# Patient Record
Sex: Male | Born: 1980 | Race: White | Hispanic: No | Smoking: Current some day smoker
Health system: Southern US, Community
[De-identification: ages and names within clinical notes are randomized; demographics above are authoritative.]

## PROBLEM LIST (undated history)

## (undated) ENCOUNTER — Emergency Department: Discharge status: Absent without leave | Payer: Medicaid Other

## (undated) DIAGNOSIS — Z9049 Acquired absence of other specified parts of digestive tract: Secondary | ICD-10-CM

## (undated) DIAGNOSIS — L03115 Cellulitis of right lower limb: Secondary | ICD-10-CM

## (undated) DIAGNOSIS — L02415 Cutaneous abscess of right lower limb: Secondary | ICD-10-CM

## (undated) DIAGNOSIS — G8918 Other acute postprocedural pain: Secondary | ICD-10-CM

## (undated) DIAGNOSIS — I4891 Unspecified atrial fibrillation: Secondary | ICD-10-CM

## (undated) HISTORY — DX: Unspecified atrial fibrillation: I48.91

## (undated) HISTORY — DX: Acquired absence of other specified parts of digestive tract: Z90.49

## (undated) HISTORY — DX: Cellulitis of right lower limb: L03.115

## (undated) HISTORY — DX: Other acute postprocedural pain: G89.18

## (undated) HISTORY — PX: APPENDECTOMY: SHX54

## (undated) HISTORY — DX: Cutaneous abscess of right lower limb: L02.415

---

## 2005-09-12 ENCOUNTER — Emergency Department: Payer: Self-pay | Admitting: Emergency Medicine

## 2005-09-16 ENCOUNTER — Emergency Department: Payer: Self-pay | Admitting: Internal Medicine

## 2005-09-17 ENCOUNTER — Ambulatory Visit: Payer: Self-pay | Admitting: Internal Medicine

## 2005-12-19 ENCOUNTER — Emergency Department: Payer: Self-pay | Admitting: Emergency Medicine

## 2005-12-20 ENCOUNTER — Ambulatory Visit: Payer: Self-pay | Admitting: Orthopaedic Surgery

## 2006-07-12 ENCOUNTER — Emergency Department: Payer: Self-pay | Admitting: Emergency Medicine

## 2006-07-12 ENCOUNTER — Other Ambulatory Visit: Payer: Self-pay

## 2007-01-01 ENCOUNTER — Emergency Department: Payer: Self-pay | Admitting: Emergency Medicine

## 2007-10-05 ENCOUNTER — Emergency Department: Payer: Self-pay | Admitting: Unknown Physician Specialty

## 2009-05-21 ENCOUNTER — Observation Stay: Payer: Self-pay | Admitting: Internal Medicine

## 2009-11-07 ENCOUNTER — Emergency Department: Payer: Self-pay | Admitting: Emergency Medicine

## 2010-05-16 ENCOUNTER — Emergency Department: Payer: Self-pay | Admitting: Family Medicine

## 2010-05-19 ENCOUNTER — Inpatient Hospital Stay: Payer: Self-pay | Admitting: Internal Medicine

## 2010-05-31 ENCOUNTER — Emergency Department: Payer: Self-pay | Admitting: Emergency Medicine

## 2010-08-08 ENCOUNTER — Emergency Department: Payer: Self-pay | Admitting: Emergency Medicine

## 2010-09-03 ENCOUNTER — Emergency Department (HOSPITAL_COMMUNITY)
Admission: EM | Admit: 2010-09-03 | Discharge: 2010-09-03 | Payer: Self-pay | Source: Home / Self Care | Admitting: Emergency Medicine

## 2010-09-04 ENCOUNTER — Inpatient Hospital Stay: Payer: Self-pay | Admitting: Unknown Physician Specialty

## 2010-10-25 ENCOUNTER — Emergency Department: Payer: Self-pay | Admitting: Emergency Medicine

## 2010-11-21 ENCOUNTER — Emergency Department: Payer: Self-pay

## 2010-11-22 ENCOUNTER — Emergency Department: Payer: Self-pay | Admitting: Emergency Medicine

## 2010-11-25 ENCOUNTER — Emergency Department: Payer: Self-pay | Admitting: Emergency Medicine

## 2010-11-29 ENCOUNTER — Emergency Department: Payer: Self-pay | Admitting: Emergency Medicine

## 2010-12-05 LAB — POCT I-STAT, CHEM 8
BUN: 9 mg/dL (ref 6–23)
Calcium, Ion: 1.12 mmol/L (ref 1.12–1.32)
Chloride: 104 mEq/L (ref 96–112)
Creatinine, Ser: 1.1 mg/dL (ref 0.4–1.5)
Glucose, Bld: 82 mg/dL (ref 70–99)
Potassium: 4.4 mEq/L (ref 3.5–5.1)
Sodium: 139 mEq/L (ref 135–145)

## 2010-12-05 LAB — ETHANOL: Alcohol, Ethyl (B): <5 mg/dL (ref 0–10)

## 2010-12-05 LAB — URINALYSIS, ROUTINE W REFLEX MICROSCOPIC
Bilirubin Urine: NEGATIVE
Glucose, UA: NEGATIVE mg/dL
Specific Gravity, Urine: 1.017 (ref 1.005–1.030)

## 2010-12-05 LAB — RAPID URINE DRUG SCREEN, HOSP PERFORMED
Amphetamines: NOT DETECTED
Opiates: NOT DETECTED

## 2011-06-19 ENCOUNTER — Inpatient Hospital Stay: Payer: Self-pay | Admitting: Internal Medicine

## 2011-06-20 DIAGNOSIS — R079 Chest pain, unspecified: Secondary | ICD-10-CM

## 2011-06-25 ENCOUNTER — Encounter: Payer: Self-pay | Admitting: Internal Medicine

## 2011-10-23 ENCOUNTER — Emergency Department: Payer: Self-pay | Admitting: Emergency Medicine

## 2012-01-23 ENCOUNTER — Emergency Department: Payer: Self-pay | Admitting: Emergency Medicine

## 2012-02-02 ENCOUNTER — Emergency Department: Payer: Self-pay | Admitting: Emergency Medicine

## 2012-02-07 ENCOUNTER — Emergency Department: Payer: Self-pay | Admitting: *Deleted

## 2012-05-12 ENCOUNTER — Emergency Department: Payer: Self-pay | Admitting: Emergency Medicine

## 2012-05-24 ENCOUNTER — Emergency Department: Payer: Self-pay | Admitting: Emergency Medicine

## 2012-05-24 LAB — BASIC METABOLIC PANEL WITH GFR
Anion Gap: 6 — ABNORMAL LOW (ref 7–16)
BUN: 16 mg/dL (ref 7–18)
Calcium, Total: 9.3 mg/dL (ref 8.5–10.1)
Chloride: 103 mmol/L (ref 98–107)
Co2: 30 mmol/L (ref 21–32)
Creatinine: 1.18 mg/dL (ref 0.60–1.30)
EGFR (African American): >60
EGFR (Non-African Amer.): >60
Glucose: 104 mg/dL — ABNORMAL HIGH (ref 65–99)
Osmolality: 279 (ref 275–301)
Potassium: 3.8 mmol/L (ref 3.5–5.1)
Sodium: 139 mmol/L (ref 136–145)

## 2012-05-24 LAB — HEPATIC FUNCTION PANEL A (ARMC)
Albumin: 4 g/dL (ref 3.4–5.0)
Alkaline Phosphatase: 109 U/L (ref 50–136)
Bilirubin, Direct: 0.1 mg/dL (ref 0.00–0.20)
Bilirubin,Total: 0.4 mg/dL (ref 0.2–1.0)
SGOT(AST): 18 U/L (ref 15–37)
SGPT (ALT): 18 U/L (ref 12–78)
Total Protein: 7.4 g/dL (ref 6.4–8.2)

## 2012-05-24 LAB — CBC
HGB: 15.6 g/dL (ref 13.0–18.0)
MCH: 32.5 pg (ref 26.0–34.0)
RBC: 4.82 10*6/uL (ref 4.40–5.90)

## 2012-05-24 LAB — URINALYSIS, COMPLETE
Bilirubin,UR: NEGATIVE
Leukocyte Esterase: NEGATIVE
RBC,UR: <1 /HPF (ref 0–5)
Squamous Epithelial: NONE SEEN

## 2012-07-03 ENCOUNTER — Emergency Department: Payer: Self-pay | Admitting: Internal Medicine

## 2012-09-01 ENCOUNTER — Emergency Department: Payer: Self-pay | Admitting: Emergency Medicine

## 2012-09-01 LAB — CBC
Platelet: 239 10*3/uL (ref 150–440)
RBC: 4.87 10*6/uL (ref 4.40–5.90)
WBC: 7.1 10*3/uL (ref 3.8–10.6)

## 2012-09-01 LAB — COMPREHENSIVE METABOLIC PANEL
Albumin: 4 g/dL (ref 3.4–5.0)
Anion Gap: 7 (ref 7–16)
Calcium, Total: 8.7 mg/dL (ref 8.5–10.1)
Glucose: 91 mg/dL (ref 65–99)
Potassium: 4.1 mmol/L (ref 3.5–5.1)
SGOT(AST): 33 U/L (ref 15–37)

## 2013-02-03 ENCOUNTER — Emergency Department: Payer: Self-pay | Admitting: Emergency Medicine

## 2013-02-03 LAB — COMPREHENSIVE METABOLIC PANEL
Alkaline Phosphatase: 97 U/L (ref 50–136)
Anion Gap: 9 (ref 7–16)
BUN: 10 mg/dL (ref 7–18)
Bilirubin,Total: 0.3 mg/dL (ref 0.2–1.0)
Calcium, Total: 9 mg/dL (ref 8.5–10.1)
Creatinine: 1.17 mg/dL (ref 0.60–1.30)
EGFR (African American): >60
Glucose: 136 mg/dL — ABNORMAL HIGH (ref 65–99)
Potassium: 3.3 mmol/L — ABNORMAL LOW (ref 3.5–5.1)
SGPT (ALT): 33 U/L (ref 12–78)

## 2013-02-03 LAB — CBC
HGB: 14.9 g/dL (ref 13.0–18.0)
MCHC: 34 g/dL (ref 32.0–36.0)
Platelet: 252 10*3/uL (ref 150–440)
RBC: 4.79 10*6/uL (ref 4.40–5.90)
RDW: 12.5 % (ref 11.5–14.5)

## 2013-02-04 LAB — URINALYSIS, COMPLETE
Bilirubin,UR: NEGATIVE
Ph: 6 (ref 4.5–8.0)
Specific Gravity: 1.018 (ref 1.003–1.030)

## 2014-01-04 ENCOUNTER — Emergency Department: Payer: Self-pay | Admitting: Emergency Medicine

## 2014-01-04 LAB — CBC
HCT: 44.5 % (ref 40.0–52.0)
HGB: 14.5 g/dL (ref 13.0–18.0)
MCH: 30.4 pg (ref 26.0–34.0)
MCHC: 32.6 g/dL (ref 32.0–36.0)
MCV: 93 fL (ref 80–100)
PLATELETS: 221 10*3/uL (ref 150–440)
RBC: 4.77 10*6/uL (ref 4.40–5.90)
RDW: 12.6 % (ref 11.5–14.5)
WBC: 7.6 10*3/uL (ref 3.8–10.6)

## 2014-01-04 LAB — TSH: Thyroid Stimulating Horm: 1.28 u[IU]/mL

## 2014-08-24 ENCOUNTER — Emergency Department: Payer: Self-pay | Admitting: Student

## 2015-11-20 ENCOUNTER — Emergency Department: Payer: BLUE CROSS/BLUE SHIELD

## 2015-11-20 ENCOUNTER — Encounter: Payer: Self-pay | Admitting: *Deleted

## 2015-11-20 ENCOUNTER — Emergency Department
Admission: EM | Admit: 2015-11-20 | Discharge: 2015-11-20 | Disposition: A | Discharge status: Home / Self Care | Payer: BLUE CROSS/BLUE SHIELD | Attending: Emergency Medicine | Admitting: Emergency Medicine

## 2015-11-20 DIAGNOSIS — R1030 Lower abdominal pain, unspecified: Secondary | ICD-10-CM

## 2015-11-20 DIAGNOSIS — R1031 Right lower quadrant pain: Secondary | ICD-10-CM | POA: Diagnosis not present

## 2015-11-20 DIAGNOSIS — M7022 Olecranon bursitis, left elbow: Secondary | ICD-10-CM

## 2015-11-20 DIAGNOSIS — R1032 Left lower quadrant pain: Secondary | ICD-10-CM | POA: Diagnosis not present

## 2015-11-20 DIAGNOSIS — Z880 Allergy status to penicillin: Secondary | ICD-10-CM | POA: Insufficient documentation

## 2015-11-20 DIAGNOSIS — J069 Acute upper respiratory infection, unspecified: Secondary | ICD-10-CM | POA: Diagnosis not present

## 2015-11-20 DIAGNOSIS — Y9389 Activity, other specified: Secondary | ICD-10-CM | POA: Diagnosis not present

## 2015-11-20 DIAGNOSIS — R112 Nausea with vomiting, unspecified: Secondary | ICD-10-CM

## 2015-11-20 DIAGNOSIS — R05 Cough: Secondary | ICD-10-CM | POA: Diagnosis present

## 2015-11-20 DIAGNOSIS — Z792 Long term (current) use of antibiotics: Secondary | ICD-10-CM | POA: Insufficient documentation

## 2015-11-20 DIAGNOSIS — K920 Hematemesis: Secondary | ICD-10-CM | POA: Diagnosis present

## 2015-11-20 LAB — CBC
HCT: 45.5 % (ref 40.0–52.0)
Hemoglobin: 15.4 g/dL (ref 13.0–18.0)
MCH: 31.2 pg (ref 26.0–34.0)
MCHC: 33.9 g/dL (ref 32.0–36.0)
MCV: 91.8 fL (ref 80.0–100.0)
PLATELETS: 164 10*3/uL (ref 150–440)
RBC: 4.95 MIL/uL (ref 4.40–5.90)
RDW: 12.5 % (ref 11.5–14.5)
WBC: 4.1 10*3/uL (ref 3.8–10.6)

## 2015-11-20 LAB — COMPREHENSIVE METABOLIC PANEL
ALT: 23 U/L (ref 17–63)
AST: 31 U/L (ref 15–41)
Albumin: 4.1 g/dL (ref 3.5–5.0)
Alkaline Phosphatase: 63 U/L (ref 38–126)
Anion gap: 11 (ref 5–15)
BILIRUBIN TOTAL: 0.4 mg/dL (ref 0.3–1.2)
BUN: 12 mg/dL (ref 6–20)
CALCIUM: 8.6 mg/dL — AB (ref 8.9–10.3)
CHLORIDE: 102 mmol/L (ref 101–111)
CO2: 25 mmol/L (ref 22–32)
CREATININE: 1.12 mg/dL (ref 0.61–1.24)
Glucose, Bld: 110 mg/dL — ABNORMAL HIGH (ref 65–99)
Potassium: 3.8 mmol/L (ref 3.5–5.1)
Sodium: 138 mmol/L (ref 135–145)
TOTAL PROTEIN: 7.3 g/dL (ref 6.5–8.1)

## 2015-11-20 LAB — LIPASE, BLOOD: LIPASE: 37 U/L (ref 11–51)

## 2015-11-20 LAB — RAPID INFLUENZA A&B ANTIGENS
Influenza A (ARMC): NOT DETECTED
Influenza B (ARMC): NOT DETECTED

## 2015-11-20 MED ORDER — GUAIFENESIN-CODEINE 100-10 MG/5ML PO SOLN: 10.0000 mL | ORAL | Status: DC | PRN

## 2015-11-20 MED ORDER — ONDANSETRON HCL 4 MG/2ML IJ SOLN
4.0000 mg | Freq: Once | INTRAMUSCULAR | Status: AC
  Administered 2015-11-20: 4 mg via INTRAVENOUS
  Filled 2015-11-20: qty 2

## 2015-11-20 MED ORDER — PROMETHAZINE HCL 25 MG/ML IJ SOLN
25.0000 mg | Freq: Once | INTRAMUSCULAR | Status: AC
  Administered 2015-11-20: 25 mg via INTRAVENOUS

## 2015-11-20 MED ORDER — PROMETHAZINE HCL 25 MG/ML IJ SOLN
INTRAMUSCULAR | Status: AC
  Administered 2015-11-20: 25 mg via INTRAVENOUS
  Filled 2015-11-20: qty 1

## 2015-11-20 MED ORDER — PROMETHAZINE HCL 25 MG PO TABS: 25.0000 mg | ORAL_TABLET | Freq: Four times a day (QID) | ORAL | Status: DC | PRN

## 2015-11-20 MED ORDER — SODIUM CHLORIDE 0.9 % IV BOLUS (SEPSIS)
1000.0000 mL | Freq: Once | INTRAVENOUS | Status: AC
Start: 2015-11-20 — End: 2015-11-20
  Administered 2015-11-20: 1000 mL via INTRAVENOUS

## 2015-11-20 MED ORDER — HYDROCODONE-ACETAMINOPHEN 5-325 MG PO TABS: 1.0000 | ORAL_TABLET | ORAL | Status: DC | PRN

## 2015-11-20 MED ORDER — IOHEXOL 240 MG/ML SOLN
25.0000 mL | Freq: Once | INTRAMUSCULAR | Status: AC | PRN
  Administered 2015-11-20: 25 mL via ORAL

## 2015-11-20 MED ORDER — IBUPROFEN 800 MG PO TABS: 800.0000 mg | ORAL_TABLET | Freq: Three times a day (TID) | ORAL | Status: DC | PRN

## 2015-11-20 MED ORDER — CHLORPHENIRAMINE MALEATE 4 MG PO TABS: 4.0000 mg | ORAL_TABLET | Freq: Two times a day (BID) | ORAL | Status: DC | PRN

## 2015-11-20 MED ORDER — MORPHINE SULFATE (PF) 4 MG/ML IV SOLN
4.0000 mg | Freq: Once | INTRAVENOUS | Status: AC
  Administered 2015-11-20: 4 mg via INTRAVENOUS
  Filled 2015-11-20: qty 1

## 2015-11-20 MED ORDER — LEVOFLOXACIN 500 MG PO TABS: 500.0000 mg | ORAL_TABLET | Freq: Every day | ORAL | Status: DC

## 2015-11-20 MED ORDER — IOHEXOL 300 MG/ML  SOLN
100.0000 mL | Freq: Once | INTRAMUSCULAR | Status: AC | PRN
  Administered 2015-11-20: 100 mL via INTRAVENOUS

## 2015-11-20 NOTE — ED Provider Notes (Signed)
[facility] Emergency Department Provider Note  ____________________________________________  Time seen: Approximately 7:20 AM  I have reviewed the triage vital signs and the nursing notes.   HISTORY  Chief Complaint Influenza    HPI Alex Doo. is a 35 y.o. male who presents for evaluation of fever chills body aches cough no blood 4 days. Patient states that he was seen in his primary care clinic and started on Z-Pak. Patient continues to feel bad with body aches and fever. No relief with current antibiotics or over-the-counter medications. Additionally complains about having a swollen painful elbow for 1 month denies any known trauma. Denies any past medical history of gout. Pain in his elbow and his body aches are about an 8/10.   No past medical history on file.  There are no active problems to display for this patient.   No past surgical history on file.  Current Outpatient Rx  Name  Route  Sig  Dispense  Refill  . chlorpheniramine (CHLOR-TRIMETON) 4 MG tablet   Oral   Take 1 tablet (4 mg total) by mouth 2 (two) times daily as needed for allergies or rhinitis.   30 tablet   0   . guaiFENesin-codeine 100-10 MG/5ML syrup   Oral   Take 10 mLs by mouth every 4 (four) hours as needed for cough.   180 mL   0   . ibuprofen (ADVIL,MOTRIN) 800 MG tablet   Oral   Take 1 tablet (800 mg total) by mouth every 8 (eight) hours as needed.   30 tablet   0   . levofloxacin (LEVAQUIN) 500 MG tablet   Oral   Take 1 tablet (500 mg total) by mouth daily.   10 tablet   0     Allergies Penicillins  No family history on file.  Social History Social History  Substance Use Topics  . Smoking status: Not on file  . Smokeless tobacco: Not on file  . Alcohol Use: Not on file    Review of Systems Constitutional: Positive fever/chills Eyes: No visual changes. ENT: Positive sore throat. Cardiovascular: Denies chest pain. Respiratory: Denies  shortness of breath. Positive for cough with productive bloody sputum. Scant. Gastrointestinal: No abdominal pain.  No nausea, no vomiting.  No diarrhea.  No constipation. Genitourinary: Negative for dysuria. Musculoskeletal: Positive for generalized body aches. Skin: Negative for rash. Neurological: Negative for headaches, focal weakness or numbness.  10-point ROS otherwise negative.  ____________________________________________   PHYSICAL EXAM:  VITAL SIGNS: ED Triage Vitals  Enc Vitals Group     BP 11/20/15 0706 124/70 mmHg     Pulse Rate 11/20/15 0706 96     Resp 11/20/15 0706 18     Temp 11/20/15 0706 99.8 F (37.7 C)     Temp Source 11/20/15 0706 Oral     SpO2 11/20/15 0706 97 %     Weight 11/20/15 0706 200 lb (90.719 kg)     Height 11/20/15 0706  (1.854 m)     Head Cir --      Peak Flow --      Pain Score 11/20/15 0710 8     Pain Loc --      Pain Edu? --      Excl. in GC? --     Constitutional: Alert and oriented. Ill appearing and in no acute distress. Head: Atraumatic. Nose: Positive congestion/rhinnorhea. Mouth/Throat: Mucous membranes are moist.  Oropharynx non-erythematous. Neck: No stridor. Full range of motion nontender  Cardiovascular: Normal rate, regular rhythm. Grossly normal heart sounds.  Good peripheral circulation. Respiratory: Normal respiratory effort.  No retractions. Lungs CTAB. Decreased breath sounds noted on the right. Musculoskeletal: No lower extremity tenderness nor edema. Left elbow with soft boggy fluid-filled sac consistent with an olecranon bursae. Neurologic:  Normal speech and language. No gross focal neurologic deficits are appreciated. No gait instability. Skin:  Skin is warm, dry and intact. No rash noted. Psychiatric: Mood and affect are normal. Speech and behavior are normal.  ____________________________________________   LABS (all labs ordered are listed, but only abnormal results are displayed)  Labs Reviewed   RAPID INFLUENZA A&B ANTIGENS ([facility] ONLY)   ____________________________________________   RADIOLOGY  No acute cardiopulmonary disease noted. ____________________________________________   PROCEDURES  Procedure(s) performed: None  Critical Care performed: No  ____________________________________________   INITIAL IMPRESSION / ASSESSMENT AND PLAN / ED COURSE  Pertinent labs & imaging results that were available during my care of the patient were reviewed by me and considered in my medical decision making (see chart for details).  Olecranon bursitis and acute upper respiratory infection. Rx given for Levaquin 500 mg daily 10 days, Robitussin-AC, Motrin 800 mg 3 times a day, and chlorpheniramine for drainage. Patient to follow up with his PCP or medical referral to Dr. Rosita Kea for orthopedic evaluation of his elbow. Work excuse 2 days given patient voices no other emergency medical complaints at this visit. ____________________________________________   FINAL CLINICAL IMPRESSION(S) / ED DIAGNOSES  Final diagnoses:  Olecranon bursitis, left  URI, acute     This chart was dictated using voice recognition software/Dragon. Despite best efforts to proofread, errors can occur which can change the meaning. Any change was purely unintentional.   Evangeline Dakin, PA-C 11/20/15 7829  Jene Every, MD 11/20/15 1452

## 2015-11-20 NOTE — ED Notes (Signed)
Pt reports he has been on a z-pak since Thursday and reports to have been coughing up blood as well.  C/o chills and sweats as well.  Pt ambulatory to room without difficulty and is wearing a mask.

## 2015-11-20 NOTE — Discharge Instructions (Signed)
Bursitis Bursitis is inflammation and irritation of a bursa, which is one of the small, fluid-filled sacs that cushion and protect the moving parts of your body. These sacs are located between bones and muscles, muscle attachments, or skin areas next to bones. A bursa protects these structures from the wear and tear that results from frequent movement. An inflamed bursa causes pain and swelling. Fluid may build up inside the sac. Bursitis is most common near joints, especially the knees, elbows, hips, and shoulders. CAUSES Bursitis can be caused by:   Injury from:  A direct blow, like falling on your knee or elbow.  Overuse of a joint (repetitive stress).  Infection. This can happen if bacteria gets into a bursa through a cut or scrape near a joint.  Diseases that cause joint inflammation, such as gout and rheumatoid arthritis. RISK FACTORS You may be at risk for bursitis if you:   Have a job or hobby that involves a lot of repetitive stress on your joints.  Have a condition that weakens your body's defense system (immune system), such as diabetes, cancer, or HIV.  Lift and reach overhead often.  Kneel or lean on hard surfaces often.  Run or walk often. SIGNS AND SYMPTOMS The most common signs and symptoms of bursitis are:  Pain that gets worse when you move the affected body part or put weight on it.  Inflammation.  Stiffness. Other signs and symptoms may include:  Redness.  Tenderness.  Warmth.  Pain that continues after rest.  Fever and chills. This may occur in bursitis caused by infection. DIAGNOSIS Bursitis may be diagnosed by:   Medical history and physical exam.  MRI.  A procedure to drain fluid from the bursa with a needle (aspiration). The fluid may be checked for signs of infection or gout.  Blood tests to rule out other causes of inflammation. TREATMENT  Bursitis can usually be treated at home with rest, ice, compression, and elevation (RICE). For  mild bursitis, RICE treatment may be all you need. Other treatments may include:  Nonsteroidal anti-inflammatory drugs (NSAIDs) to treat pain and inflammation.  Corticosteroids to fight inflammation. You may have these drugs injected into and around the area of bursitis.  Aspiration of bursitis fluid to relieve pain and improve movement.  Antibiotic medicine to treat an infected bursa.  A splint, brace, or walking aid.  Physical therapy if you continue to have pain or limited movement.  Surgery to remove a damaged or infected bursa. This may be needed if you have a very bad case of bursitis or if other treatments have not worked. HOME CARE INSTRUCTIONS   Take medicines only as directed by your health care provider.  If you were prescribed an antibiotic medicine, finish it all even if you start to feel better.  Rest the affected area as directed by your health care provider.  Keep the area elevated.  Avoid activities that make pain worse.  Apply ice to the injured area:  Place ice in a plastic bag.  Place a towel between your skin and the bag.  Leave the ice on for 20 minutes, 2-3 times a day.  Use splints, braces, pads, or walking aids as directed by your health care provider.  Keep all follow-up visits as directed by your health care provider. This is important. PREVENTION   Wear knee pads if you kneel often.  Wear sturdy running or walking shoes that fit you well.  Take regular breaks from repetitive activity.  Warm  up by stretching before doing any strenuous activity.  Maintain a healthy weight or lose weight as recommended by your health care provider. Ask your health care provider if you need help.  Exercise regularly. Start any new physical activity gradually. SEEK MEDICAL CARE IF:   Your bursitis is not responding to treatment or home care.  You have a fever.  You have chills.   This information is not intended to replace advice given to you by your  health care provider. Make sure you discuss any questions you have with your health care provider.   Document Released: 09/07/2000 Document Revised: 06/01/2015 Document Reviewed: 11/30/2013 Elsevier Interactive Patient Education 2016 [employer]  Upper Respiratory Infection, Adult Most upper respiratory infections (URIs) are a viral infection of the air passages leading to the lungs. A URI affects the nose, throat, and upper air passages. The most common type of URI is nasopharyngitis and is typically referred to as "the common cold." URIs run their course and usually go away on their own. Most of the time, a URI does not require medical attention, but sometimes a bacterial infection in the upper airways can follow a viral infection. This is called a secondary infection. Sinus and middle ear infections are common types of secondary upper respiratory infections. Bacterial pneumonia can also complicate a URI. A URI can worsen asthma and chronic obstructive pulmonary disease (COPD). Sometimes, these complications can require emergency medical care and may be life threatening.  CAUSES Almost all URIs are caused by viruses. A virus is a type of germ and can spread from one person to another.  RISKS FACTORS You may be at risk for a URI if:   You smoke.   You have chronic heart or lung disease.  You have a weakened defense (immune) system.   You are very young or very old.   You have nasal allergies or asthma.  You work in crowded or poorly ventilated areas.  You work in health care facilities or schools. SIGNS AND SYMPTOMS  Symptoms typically develop 2-3 days after you come in contact with a cold virus. Most viral URIs last 7-10 days. However, viral URIs from the influenza virus (flu virus) can last 14-18 days and are typically more severe. Symptoms may include:   Runny or stuffy (congested) nose.   Sneezing.   Cough.   Sore throat.   Headache.   Fatigue.   Fever.    Loss of appetite.   Pain in your forehead, behind your eyes, and over your cheekbones (sinus pain).  Muscle aches.  DIAGNOSIS  Your health care provider may diagnose a URI by:  Physical exam.  Tests to check that your symptoms are not due to another condition such as:  Strep throat.  Sinusitis.  Pneumonia.  Asthma. TREATMENT  A URI goes away on its own with time. It cannot be cured with medicines, but medicines may be prescribed or recommended to relieve symptoms. Medicines may help:  Reduce your fever.  Reduce your cough.  Relieve nasal congestion. HOME CARE INSTRUCTIONS   Take medicines only as directed by your health care provider.   Gargle warm saltwater or take cough drops to comfort your throat as directed by your health care provider.  Use a warm mist humidifier or inhale steam from a shower to increase air moisture. This may make it easier to breathe.  Drink enough fluid to keep your urine clear or pale yellow.   Eat soups and other clear broths and maintain good  nutrition.   Rest as needed.   Return to work when your temperature has returned to normal or as your health care provider advises. You may need to stay home longer to avoid infecting others. You can also use a face mask and careful hand washing to prevent spread of the virus.  Increase the usage of your inhaler if you have asthma.   Do not use any tobacco products, including cigarettes, chewing tobacco, or electronic cigarettes. If you need help quitting, ask your health care provider. PREVENTION  The best way to protect yourself from getting a cold is to practice good hygiene.   Avoid oral or hand contact with people with cold symptoms.   Wash your hands often if contact occurs.  There is no clear evidence that vitamin C, vitamin E, echinacea, or exercise reduces the chance of developing a cold. However, it is always recommended to get plenty of rest, exercise, and practice good  nutrition.  SEEK MEDICAL CARE IF:   You are getting worse rather than better.   Your symptoms are not controlled by medicine.   You have chills.  You have worsening shortness of breath.  You have brown or red mucus.  You have yellow or brown nasal discharge.  You have pain in your face, especially when you bend forward.  You have a fever.  You have swollen neck glands.  You have pain while swallowing.  You have white areas in the back of your throat. SEEK IMMEDIATE MEDICAL CARE IF:   You have severe or persistent:  Headache.  Ear pain.  Sinus pain.  Chest pain.  You have chronic lung disease and any of the following:  Wheezing.  Prolonged cough.  Coughing up blood.  A change in your usual mucus.  You have a stiff neck.  You have changes in your:  Vision.  Hearing.  Thinking.  Mood. MAKE SURE YOU:   Understand these instructions.  Will watch your condition.  Will get help right away if you are not doing well or get worse.   This information is not intended to replace advice given to you by your health care provider. Make sure you discuss any questions you have with your health care provider.   Document Released: 03/06/2001 Document Revised: 01/25/2015 Document Reviewed: 12/16/2013 Elsevier Interactive Patient Education [employer]

## 2015-11-20 NOTE — ED Notes (Signed)
Pt. Also states intermitant bilateral flank pain

## 2015-11-20 NOTE — Discharge Instructions (Signed)
You have been seen in the emergency department for abdominal discomfort, nausea and vomiting. Your workup as shown largely normal results. Your CT scan is normal. Please take your pain and nausea medication as written, as needed. Please follow-up with a primary care physician by calling the number provided to arrange a follow-up appointment in 1-2 days for recheck/reevaluation. Return to the emergency department for any worsening pain, or any other symptom personally concerning to yourself.   Abdominal Pain, Adult Many things can cause abdominal pain. Usually, abdominal pain is not caused by a disease and will improve without treatment. It can often be observed and treated at home. Your health care provider will do a physical exam and possibly order blood tests and X-rays to help determine the seriousness of your pain. However, in many cases, more time must pass before a clear cause of the pain can be found. Before that point, your health care provider may not know if you need more testing or further treatment. HOME CARE INSTRUCTIONS Monitor your abdominal pain for any changes. The following actions may help to alleviate any discomfort you are experiencing:  Only take over-the-counter or prescription medicines as directed by your health care provider.  Do not take laxatives unless directed to do so by your health care provider.  Try a clear liquid diet (broth, tea, or water) as directed by your health care provider. Slowly move to a bland diet as tolerated. SEEK MEDICAL CARE IF:  You have unexplained abdominal pain.  You have abdominal pain associated with nausea or diarrhea.  You have pain when you urinate or have a bowel movement.  You experience abdominal pain that wakes you in the night.  You have abdominal pain that is worsened or improved by eating food.  You have abdominal pain that is worsened with eating fatty foods.  You have a fever. SEEK IMMEDIATE MEDICAL CARE IF:  Your pain  does not go away within 2 hours.  You keep throwing up (vomiting).  Your pain is felt only in portions of the abdomen, such as the right side or the left lower portion of the abdomen.  You pass bloody or black tarry stools. MAKE SURE YOU:  Understand these instructions.  Will watch your condition.  Will get help right away if you are not doing well or get worse.   This information is not intended to replace advice given to you by your health care provider. Make sure you discuss any questions you have with your health care provider.   Document Released: 06/20/2005 Document Revised: 06/01/2015 Document Reviewed: 05/20/2013 Elsevier Interactive Patient Education [employer]

## 2015-11-20 NOTE — ED Notes (Signed)
Pt. Reported he vomited at CT.

## 2015-11-20 NOTE — ED Provider Notes (Signed)
[facility] Emergency Department Provider Note  Time seen: 9:39 PM  I have reviewed the triage vital signs and the nursing notes.   HISTORY  Chief Complaint Hematemesis    HPI Alex Wilkins. is a 35 y.o. male with no past medical history who presents the emergency department for hematemesis. According to the patient for the past 5 days he has been feeling very weak with body aches, nauseated with occasional vomiting. Patient was seen by an urgent care 4 days ago and prescribed a Z-Pak for possible upper respiratory infection. Patient states he was not coughing or congested at the time. He denies fever. But states he has been having chills. Patient is seen in the emergency department this morning, with a negative influenza screen was discharged home. Patient is return to the emergency department saying for the past 2 days he is now vomiting blood in his vomit. Denies any upper abdominal pain but states moderate bilateral lower abdominal pain. Denies any diarrhea, black or bloody stool. He continues to feel nauseated with occasional vomiting. Denies any fever. Describes his pain as moderate 6-7/10 currently. Denies any dysuria or hematuria. Denies any history of kidney stones. Denies any history of gastritis or gastric ulcers in the past.    No past medical history on file.  There are no active problems to display for this patient.   No past surgical history on file.  Current Outpatient Rx  Name  Route  Sig  Dispense  Refill  . chlorpheniramine (CHLOR-TRIMETON) 4 MG tablet   Oral   Take 1 tablet (4 mg total) by mouth 2 (two) times daily as needed for allergies or rhinitis.   30 tablet   0   . guaiFENesin-codeine 100-10 MG/5ML syrup   Oral   Take 10 mLs by mouth every 4 (four) hours as needed for cough.   180 mL   0   . ibuprofen (ADVIL,MOTRIN) 800 MG tablet   Oral   Take 1 tablet (800 mg total) by mouth every 8 (eight) hours as needed.   30  tablet   0   . levofloxacin (LEVAQUIN) 500 MG tablet   Oral   Take 1 tablet (500 mg total) by mouth daily.   10 tablet   0     Allergies Penicillins  No family history on file.  Social History Social History  Substance Use Topics  . Smoking status: Never Smoker   . Smokeless tobacco: Not on file  . Alcohol Use: No    Review of Systems Constitutional: Negative for fever. Cardiovascular: Negative for chest pain. Respiratory: Negative for shortness of breath. Gastrointestinal: Moderate bilateral lower abdominal pain. Positive for nausea and vomiting, with blood within the vomit. Genitourinary: Negative for dysuria. Negative for hematuria. Musculoskeletal: Negative for back pain. Neurological: Negative for headache 10-point ROS otherwise negative.  ____________________________________________   PHYSICAL EXAM:  VITAL SIGNS: ED Triage Vitals  Enc Vitals Group     BP 11/20/15 1952 118/70 mmHg     Pulse Rate 11/20/15 1952 91     Resp 11/20/15 1952 20     Temp 11/20/15 1952 99 F (37.2 C)     Temp Source 11/20/15 1952 Oral     SpO2 11/20/15 1952 98 %     Weight 11/20/15 1952 200 lb (90.719 kg)     Height 11/20/15 1952  (1.854 m)     Head Cir --      Peak Flow --  Pain Score 11/20/15 1953 7     Pain Loc --      Pain Edu? --      Excl. in GC? --     Constitutional: Alert and oriented. Well appearing and in no distress. Eyes: Normal exam ENT   Head: Normocephalic and atraumatic.   Mouth/Throat: Mucous membranes are moist. Cardiovascular: Normal rate, regular rhythm. No murmur Respiratory: Normal respiratory effort without tachypnea nor retractions. Breath sounds are clear  Gastrointestinal: Moderate lower abdominal pain in the left lower and right lower quadrants. No rebound or guarding. No distention. No upper abdominal pain or tenderness. Musculoskeletal: Nontender with normal range of motion in all extremities.  Neurologic:  Normal speech and  language. No gross focal neurologic deficits  Skin:  Skin is warm, dry and intact.  Psychiatric: Mood and affect are normal. Speech and behavior are normal.  ____________________________________________   RADIOLOGY  CT shows no acute abnormality  ____________________________________________   INITIAL IMPRESSION / ASSESSMENT AND PLAN / ED COURSE  Pertinent labs & imaging results that were available during my care of the patient were reviewed by me and considered in my medical decision making (see chart for details).  Patient presents the emergency department with nausea, vomiting, hematemesis. Patient states his symptoms have been ongoing for approximately 5 days, the vomit turned somewhat bloody 2 days ago. Patient has bilateral lower abdominal pain. On exam the patient has mild to moderate lower abdominal tenderness, his exam is otherwise benign, clear lung sounds. Labs are within normal limits. Influenza screen negative. As this is the patient's third health care visit in the past 5 days for the same symptoms, and he complains of progressively worsening lower abdominal discomfort we will also obtain a urinalysis and CT abdomen/pelvis to further evaluate. Suspect likely viral syndrome now complicated by Mallory-Weiss tear. Patient has had no vomiting in the emergency department.  Labs are within normal limits. Patient's CT scan shows no acute abnormality. Patient does have low-grade fever 99-90 9.8 in the emergency department. Suspect likely viral syndrome complicated by Mallory-Weiss tear. We'll discharge with a short course of pain and nausea medication and have him follow the primary care physician 1-2 days for recheck/reevaluation. I discussed return precautions with the patient and he is agreeable.  ____________________________________________   FINAL CLINICAL IMPRESSION(S) / ED DIAGNOSES  Nausea and vomiting Abdominal pain   Minna Antis, MD 11/20/15 2317

## 2015-11-20 NOTE — ED Notes (Signed)
Pt states he has been vomiting blood for 3 days.  Pt reports abd pain.  No diarrhea.  Pt was seen in the ER earlier today and was dx congestion.  Pt alert.  speech clear.

## 2015-11-20 NOTE — ED Notes (Signed)
Reviewed d/c instruction, follow-up care, and prescriptions with pt. Pt verbalized understanding.

## 2015-11-20 NOTE — ED Notes (Addendum)
Pt. States he was seen here early today for the same reason.  Pt. States he has had lower extremity body aches with abdominal pain.  Pt. States he has vomited multiple times starting last night.  Pt. States blood has been in vomit.  Pt. Denies anyone in residence has same symptoms.

## 2015-11-20 NOTE — ED Notes (Signed)
Pt states he was given Z-pack Thursday. For possibly the flu. States he is on the last day of his antibiotic and still felling nauseated and have chills and fever with no temp. Pt also c/o swollen painful area on left elbow that has been there for one month

## 2016-03-02 ENCOUNTER — Emergency Department
Admission: EM | Admit: 2016-03-02 | Discharge: 2016-03-03 | Disposition: A | Discharge status: Home / Self Care | Payer: BLUE CROSS/BLUE SHIELD | Attending: Emergency Medicine | Admitting: Emergency Medicine

## 2016-03-02 ENCOUNTER — Emergency Department: Payer: BLUE CROSS/BLUE SHIELD

## 2016-03-02 DIAGNOSIS — W230XXA Caught, crushed, jammed, or pinched between moving objects, initial encounter: Secondary | ICD-10-CM | POA: Insufficient documentation

## 2016-03-02 DIAGNOSIS — Y929 Unspecified place or not applicable: Secondary | ICD-10-CM | POA: Insufficient documentation

## 2016-03-02 DIAGNOSIS — S62661B Nondisplaced fracture of distal phalanx of left index finger, initial encounter for open fracture: Secondary | ICD-10-CM | POA: Insufficient documentation

## 2016-03-02 DIAGNOSIS — M79645 Pain in left finger(s): Secondary | ICD-10-CM | POA: Diagnosis present

## 2016-03-02 DIAGNOSIS — Z79899 Other long term (current) drug therapy: Secondary | ICD-10-CM | POA: Diagnosis not present

## 2016-03-02 DIAGNOSIS — Y999 Unspecified external cause status: Secondary | ICD-10-CM | POA: Insufficient documentation

## 2016-03-02 DIAGNOSIS — Z792 Long term (current) use of antibiotics: Secondary | ICD-10-CM | POA: Insufficient documentation

## 2016-03-02 DIAGNOSIS — Y939 Activity, unspecified: Secondary | ICD-10-CM | POA: Insufficient documentation

## 2016-03-02 MED ORDER — OXYCODONE-ACETAMINOPHEN 5-325 MG PO TABS
1.0000 | ORAL_TABLET | Freq: Once | ORAL | Status: AC
  Administered 2016-03-03: 1 via ORAL
  Filled 2016-03-02: qty 1

## 2016-03-02 MED ORDER — CIPROFLOXACIN HCL 500 MG PO TABS
500.0000 mg | ORAL_TABLET | Freq: Once | ORAL | Status: AC
  Administered 2016-03-03: 500 mg via ORAL
  Filled 2016-03-02: qty 1

## 2016-03-02 MED ORDER — CLINDAMYCIN HCL 150 MG PO CAPS
300.0000 mg | ORAL_CAPSULE | Freq: Once | ORAL | Status: AC
  Administered 2016-03-03: 300 mg via ORAL
  Filled 2016-03-02: qty 2

## 2016-03-02 NOTE — ED Provider Notes (Signed)
[facility] Emergency Department Provider Note  ____________________________________________  Time seen: Approximately 11:49 PM  I have reviewed the triage vital signs and the nursing notes.   HISTORY  Chief Complaint Laceration    HPI Alex Wilkins. is a 35 y.o. male , NAD, presents to emergency room with complaint of laceration to left index finger. States he slammed the finger in a car door and was subsequently latched in the locking mechanism. States he was at home and cleaned the wound and finger with hydrogen peroxide as well as rubbing alcohol. Notes severe pain in the finger rating it a 10 out of 10. Had some numbness and tingling towards the distal end. Last tetanus vaccine was 3 years ago.   History reviewed. No pertinent past medical history.  There are no active problems to display for this patient.   History reviewed. No pertinent past surgical history.  Current Outpatient Rx  Name  Route  Sig  Dispense  Refill  . azithromycin (ZITHROMAX) 250 MG tablet   Oral   Take 250 mg by mouth daily.         . chlorpheniramine (CHLOR-TRIMETON) 4 MG tablet   Oral   Take 1 tablet (4 mg total) by mouth 2 (two) times daily as needed for allergies or rhinitis.   30 tablet   0   . ciprofloxacin (CIPRO) 500 MG tablet   Oral   Take 1 tablet (500 mg total) by mouth 2 (two) times daily.   20 tablet   0   . clindamycin (CLEOCIN) 300 MG capsule   Oral   Take 1 capsule (300 mg total) by mouth 3 (three) times daily.   30 capsule   0   . guaiFENesin-codeine 100-10 MG/5ML syrup   Oral   Take 10 mLs by mouth every 4 (four) hours as needed for cough.   180 mL   0   . HYDROcodone-acetaminophen (NORCO/VICODIN) 5-325 MG tablet   Oral   Take 1 tablet by mouth every 4 (four) hours as needed for moderate pain.   20 tablet   0   . HYDROcodone-homatropine (HYCODAN) 5-1.5 MG/5ML syrup   Oral   Take 5 mLs by mouth every 6 (six) hours as needed.         Marland Kitchen ibuprofen (ADVIL,MOTRIN) 800 MG tablet   Oral   Take 1 tablet (800 mg total) by mouth every 8 (eight) hours as needed.   30 tablet   0   . levofloxacin (LEVAQUIN) 500 MG tablet   Oral   Take 1 tablet (500 mg total) by mouth daily.   10 tablet   0   . oxyCODONE-acetaminophen (ROXICET) 5-325 MG tablet   Oral   Take 1-2 tablets by mouth every 6 (six) hours as needed.   20 tablet   0   . promethazine (PHENERGAN) 25 MG tablet   Oral   Take 1 tablet (25 mg total) by mouth every 6 (six) hours as needed for nausea or vomiting.   20 tablet   0     Allergies Penicillins  No family history on file.  Social History Social History  Substance Use Topics  . Smoking status: Never Smoker   . Smokeless tobacco: None  . Alcohol Use: Yes     Review of Systems  Constitutional: No fever/chills Eyes: No visual changes.  Cardiovascular: No chest pain. Respiratory:  No shortness of breath. No wheezing.  Gastrointestinal: No abdominal pain.  No nausea, vomiting.  Musculoskeletal: Positive left index finger pain.  Skin: Positive laceration to left index finger. Positive swelling left index finger.  Neurological: Positive tingling left index finger. Negative for headaches, focal weakness or numbness. 10-point ROS otherwise negative.  ____________________________________________   PHYSICAL EXAM:  VITAL SIGNS: ED Triage Vitals  Enc Vitals Group     BP 03/02/16 2231 150/86 mmHg     Pulse Rate 03/02/16 2231 78     Resp 03/02/16 2231 18     Temp 03/02/16 2231 98 F (36.7 C)     Temp Source 03/02/16 2231 Oral     SpO2 03/02/16 2231 100 %     Weight 03/02/16 2231 200 lb (90.719 kg)     Height 03/02/16 2231 6\' 1"  (1.854 m)     Head Cir --      Peak Flow --      Pain Score 03/02/16 2232 10     Pain Loc --      Pain Edu? --      Excl. in GC? --      Constitutional: Alert and oriented. Well appearing and in no acute distress. Eyes: Conjunctivae are normal.  Head:  Atraumatic. Cardiovascular:  Good peripheral circulation with 2+ pulses about the left upper extremity. Respiratory: Normal respiratory effort without tachypnea or retractions.  Musculoskeletal: Tenderness to palpation over the entire distal left index finger. Decreased range of motion at the DIP. No pain to palpation about the PIP. Full range of motion of the left index knee or at the PIP. No pain about the right hand or wrist with full range of motion. Neurologic:  Normal speech and language. No gross focal neurologic deficits are appreciated.  Skin:  1 cm laceration noted about the pad of the left index finger with bleeding controlled. 1 cm jagged laceration with skin tear noted about the dorsal portion of the left index finger without damage to the nail. Severe swelling about the distal left index finger is noted with severe pain to palpation. Skin is warm, dry. No rash noted. Psychiatric: Mood and affect are normal. Speech and behavior are normal. Patient exhibits appropriate insight and judgement.   ____________________________________________   LABS  None ____________________________________________  EKG  None ____________________________________________  RADIOLOGY I have personally viewed and evaluated these images (plain radiographs) as part of my medical decision making, as well as reviewing the written report by the radiologist.  Dg Finger Index Left  03/02/2016  CLINICAL DATA:  Pain and swelling, laceration after slamming finger in car door today. EXAM: LEFT INDEX FINGER 2+V COMPARISON:  None. FINDINGS: Comminuted nondisplaced fracture through the tuft and distal aspect of LEFT second distal phalanx without intra-articular extension. No dislocation. No destructive bony lesions. Distal second digit soft tissue swelling without subcutaneous gas or radiopaque foreign bodies. IMPRESSION: Acute nondisplaced second distal phalanx (possibly open) fracture without dislocation.  Electronically Signed   By: Awilda Metroourtnay  Bloomer M.D.   On: 03/02/2016 23:12    ____________________________________________    PROCEDURES  Procedure(s) performed: None    Medications  lidocaine (PF) (XYLOCAINE) 1 % injection 5 mL (not administered)  bupivacaine (MARCAINE) 0.5 % (with pres) injection 5 mL (not administered)  neomycin-bacitracin-polymyxin (NEOSPORIN) 400-01-4999 ointment (not administered)  clindamycin (CLEOCIN) capsule 300 mg (300 mg Oral Given 03/03/16 0001)  ciprofloxacin (CIPRO) tablet 500 mg (500 mg Oral Given 03/03/16 0001)  oxyCODONE-acetaminophen (PERCOCET/ROXICET) 5-325 MG per tablet 1 tablet (1 tablet Oral Given 03/03/16 0001)     ____________________________________________   INITIAL IMPRESSION / ASSESSMENT  AND PLAN / ED COURSE  Pertinent imaging results that were available during my care of the patient were reviewed by me and considered in my medical decision making (see chart for details).  Patient's diagnosis is consistent with Open nondisplaced fracture distal phalanx of left index finger. Patient was also evaluated by Dr. Bayard Males who has contacted Dr. Rosita Kea in orthopedics who is on call this evening to discuss the patient's case. Patient's finger was copiously cleansed with saline and iodine after digital block placed. Finger was then placed in a finger splint and wrapped with a bandage. Patient will be discharged home with prescriptions for Cipro, clindamycin, Percocet to take as directed. Patient is to follow up with Dr. Rosita Kea on Monday for follow up. Patient is given ED precautions to return to the ED for any worsening or new symptoms.      ____________________________________________  FINAL CLINICAL IMPRESSION(S) / ED DIAGNOSES  Final diagnoses:  Open nondisplaced fracture of distal phalanx of left index finger, initial encounter      NEW MEDICATIONS STARTED DURING THIS VISIT:  New Prescriptions   CIPROFLOXACIN (CIPRO) 500 MG TABLET     Take 1 tablet (500 mg total) by mouth 2 (two) times daily.   CLINDAMYCIN (CLEOCIN) 300 MG CAPSULE    Take 1 capsule (300 mg total) by mouth 3 (three) times daily.   OXYCODONE-ACETAMINOPHEN (ROXICET) 5-325 MG TABLET    Take 1-2 tablets by mouth every 6 (six) hours as needed.         Hope Pigeon, PA-C 03/03/16 0121  Darci Current, MD 03/03/16 [phone]

## 2016-03-02 NOTE — ED Notes (Signed)
Laceration to left hand pointer finger; slammed in car door. Bleeding controlled when finger wrapped in gauze. Pt able to move finger but with pain. Pt alert and oriented X4, active, cooperative, pt in NAD. RR even and unlabored, color WNL.

## 2016-03-03 MED ORDER — BUPIVACAINE HCL 0.5 % IJ SOLN
5.0000 mL | Freq: Once | INTRAMUSCULAR | Status: DC
  Filled 2016-03-03: qty 5

## 2016-03-03 MED ORDER — CIPROFLOXACIN HCL 500 MG PO TABS: 500.0000 mg | ORAL_TABLET | Freq: Two times a day (BID) | ORAL | Status: DC

## 2016-03-03 MED ORDER — BACITRACIN-NEOMYCIN-POLYMYXIN 400-5-5000 EX OINT
TOPICAL_OINTMENT | CUTANEOUS | Status: AC
  Filled 2016-03-03: qty 1

## 2016-03-03 MED ORDER — OXYCODONE-ACETAMINOPHEN 5-325 MG PO TABS: 1.0000 | ORAL_TABLET | Freq: Four times a day (QID) | ORAL | Status: DC | PRN

## 2016-03-03 MED ORDER — CLINDAMYCIN HCL 300 MG PO CAPS: 300.0000 mg | ORAL_CAPSULE | Freq: Three times a day (TID) | ORAL | Status: DC

## 2016-03-03 MED ORDER — LIDOCAINE HCL (PF) 1 % IJ SOLN
5.0000 mL | Freq: Once | INTRAMUSCULAR | Status: DC
  Filled 2016-03-03: qty 5

## 2016-03-03 MED ORDER — BUPIVACAINE HCL (PF) 0.5 % IJ SOLN
INTRAMUSCULAR | Status: AC
  Filled 2016-03-03: qty 30

## 2016-03-03 NOTE — Discharge Instructions (Signed)
Finger Fracture °Fractures of fingers are breaks in the bones of the fingers. There are many types of fractures. There are different ways of treating these fractures. Your health care provider will discuss the best way to treat your fracture. °CAUSES °Traumatic injury is the main cause of broken fingers. These include: °· Injuries while playing sports. °· Workplace injuries. °· Falls. °RISK FACTORS °Activities that can increase your risk of finger fractures include: °· Sports. °· Workplace activities that involve machinery. °· A condition called osteoporosis, which can make your bones less dense and cause them to fracture more easily. °SIGNS AND SYMPTOMS °The main symptoms of a broken finger are pain and swelling within 15 minutes after the injury. Other symptoms include: °· Bruising of your finger. °· Stiffness of your finger. °· Numbness of your finger. °· Exposed bones (compound fracture) if the fracture is severe. °DIAGNOSIS  °The best way to diagnose a broken bone is with X-ray imaging. Additionally, your health care provider will use this X-ray image to evaluate the position of the broken finger bones.  °TREATMENT  °Finger fractures can be treated with:  °· Nonreduction--This means the bones are in place. The finger is splinted without changing the positions of the bone pieces. The splint is usually left on for about a week to 10 days. This will depend on your fracture and what your health care provider thinks. °· Closed reduction--The bones are put back into position without using surgery. The finger is then splinted. °· Open reduction and internal fixation--The fracture site is opened. Then the bone pieces are fixed into place with pins or some type of hardware. This is seldom required. It depends on the severity of the fracture. °HOME CARE INSTRUCTIONS  °· Follow your health care provider's instructions regarding activities, exercises, and physical therapy. °· Only take over-the-counter or prescription  medicines for pain, discomfort, or fever as directed by your health care provider. °SEEK MEDICAL CARE IF: °You have pain or swelling that limits the motion or use of your fingers. °SEEK IMMEDIATE MEDICAL CARE IF:  °Your finger becomes numb. °MAKE SURE YOU:  °· Understand these instructions. °· Will watch your condition. °· Will get help right away if you are not doing well or get worse. °  °This information is not intended to replace advice given to you by your health care provider. Make sure you discuss any questions you have with your health care provider. °  °Document Released: 12/23/2000 Document Revised: 07/01/2013 Document Reviewed: 04/22/2013 °Elsevier Interactive Patient Education ©2016 [employer] ° °Cast or Splint Care °Casts and splints support injured limbs and keep bones from moving while they heal. It is important to care for your cast or splint at home.   °HOME CARE INSTRUCTIONS °· Keep the cast or splint uncovered during the drying period. It can take 24 to 48 hours to dry if it is made of plaster. A fiberglass cast will dry in less than 1 hour. °· Do not rest the cast on anything harder than a pillow for the first 24 hours. °· Do not put weight on your injured limb or apply pressure to the cast until your health care provider gives you permission. °· Keep the cast or splint dry. Wet casts or splints can lose their shape and may not support the limb as well. A wet cast that has lost its shape can also create harmful pressure on your skin when it dries. Also, wet skin can become infected. °¨ Cover the cast or splint with   a plastic bag when bathing or when out in the rain or snow. If the cast is on the trunk of the body, take sponge baths until the cast is removed.  If your cast does become wet, dry it with a towel or a blow dryer on the cool setting only.  Keep your cast or splint clean. Soiled casts may be wiped with a moistened cloth.  Do not place any hard or soft foreign objects under your  cast or splint, such as cotton, toilet paper, lotion, or powder.  Do not try to scratch the skin under the cast with any object. The object could get stuck inside the cast. Also, scratching could lead to an infection. If itching is a problem, use a blow dryer on a cool setting to relieve discomfort.  Do not trim or cut your cast or remove padding from inside of it.  Exercise all joints next to the injury that are not immobilized by the cast or splint. For example, if you have a long leg cast, exercise the hip joint and toes. If you have an arm cast or splint, exercise the shoulder, elbow, thumb, and fingers.  Elevate your injured arm or leg on 1 or 2 pillows for the first 1 to 3 days to decrease swelling and pain.It is best if you can comfortably elevate your cast so it is higher than your heart. SEEK MEDICAL CARE IF:   Your cast or splint cracks.  Your cast or splint is too tight or too loose.  You have unbearable itching inside the cast.  Your cast becomes wet or develops a soft spot or area.  You have a bad smell coming from inside your cast.  You get an object stuck under your cast.  Your skin around the cast becomes red or raw.  You have new pain or worsening pain after the cast has been applied. SEEK IMMEDIATE MEDICAL CARE IF:   You have fluid leaking through the cast.  You are unable to move your fingers or toes.  You have discolored (blue or white), cool, painful, or very swollen fingers or toes beyond the cast.  You have tingling or numbness around the injured area.  You have severe pain or pressure under the cast.  You have any difficulty with your breathing or have shortness of breath.  You have chest pain.   This information is not intended to replace advice given to you by your health care provider. Make sure you discuss any questions you have with your health care provider.   Document Released: 09/07/2000 Document Revised: 07/01/2013 Document Reviewed:  03/19/2013 Elsevier Interactive Patient Education 2016 [employer]   Bone and Joint Infections, Adult  Bone infections (osteomyelitis) and joint infections (septic arthritis) occur when bacteria or other germs get inside a bone or a joint. This can happen if you have an infection in another part of your body that spreads through your blood. Germs from your skin or from outside of your body can also cause this type of infection if you have a wound or a broken bone (fracture) that breaks the skin.  Anyone can get a bone infection or joint infection. You may be more likely to get this type of infection if you have a condition, such as diabetes, that lowers your ability to fight infection or increases your chances of getting an infection. Bone and joint infections can cause damage, and they can spread to other areas of your body. They need to be treated  quickly.  CAUSES  Most bone and joint infections are caused by bacteria. They can also be caused by other germs, such as viruses and funguses.  RISK FACTORS  This condition is more likely to develop in:  People who recently had surgery, especially bone or joint surgery.  People who have a long-term (chronic) disease, such as:  HIV (human immunodeficiency virus).  Diabetes.  Rheumatoid arthritis.  Sickle cell anemia. Elderly people.  People who take medicines that block or weaken the body's defense system (immune system).  People who have a condition that reduces their blood flow.  People who are on kidney dialysis.  People who have an artificial joint.  People who have had a joint or bone repaired with plates or screws (surgical hardware).  People who use or abuse IV drugs.  People who have had trauma, such as stepping on a nail. SYMPTOMS  Symptoms vary depending on the type and location of your infection. Common symptoms of bone and joint infections include:  Fever and chills.  Redness and warmth.  Swelling.  Pain and stiffness.    Drainage of fluid or pus near the infection.  Weight loss and fatigue.  Decreased ability to use a hand or foot. DIAGNOSIS  This condition may be diagnosed based on symptoms, medical history, a physical exam, and diagnostic tests. Tests can help to identify the cause of the infection. You may have various tests, such as:  A sample of tissue, fluid, or blood taken to be examined under a microscope.  A procedure to remove fluid from the infected joint with a needle (joint aspiration) for testing in a lab.  Pus or discharge swabbed from a wound for testing to identify germs and to determine what type of medicine will kill them (culture and sensitivity).  Blood tests to look for evidence of infection and inflammation (biomarkers).  Imaging studies to determine how severe the bone or joint infection is. These may include:  X-rays.  CT scan.  MRI.  Bone scan. TREATMENT  Treatment depends on the cause and type of infection. Antibiotic medicines are usually the first treatment for a bone or joint infection. Treatment with antibiotics may include:  Getting IV antibiotics. This may be done in a hospital at first. You may have to continue IV antibiotics at home for several weeks. You may also have to take antibiotics by mouth for several weeks after that.  Taking more than one kind of antibiotic. Treatment may start with a type of antibiotic that works against many different bacteria (broad spectrum antibiotics). IV antibiotics may be changed if tests show that another type may work better. Other treatments may include:  Draining fluid from the joint by placing a needle into it (aspiration).  Surgery to remove:  Dead or dying tissue from a bone or joint.  An infected artificial joint.  Infected plates or screws that were used to repair a broken bone. HOME CARE INSTRUCTIONS  Take medicines only as directed by your health care provider.  Take your antibiotic medicine as directed by your health care  provider. Finish the antibiotic even if you start to feel better.  Follow instructions from your health care provider about how to take IV antibiotics at home.  Ask your health care provider if you have any restrictions on your activities.  Keep all follow-up visits as directed by your health care provider. This is important. SEEK MEDICAL CARE IF:  You have a fever or chills.  You have redness, warmth, pain,  or swelling that returns after treatment. SEEK IMMEDIATE MEDICAL CARE IF:  You have rapid breathing or you have trouble breathing.  You have chest pain.  You cannot drink fluids or make urine.  The affected arm or leg swells, changes color, or turns blue. This information is not intended to replace advice given to you by your health care provider. Make sure you discuss any questions you have with your health care provider.  Document Released: 09/10/2005 Document Revised: 01/25/2015 Document Reviewed: 09/08/2014  Elsevier Interactive Patient Education [employer]

## 2016-03-03 NOTE — ED Notes (Signed)
Discharge instructions reviewed with patient. Patient verbalized understanding. Patient ambulated to lobby without difficulty.   

## 2016-05-27 ENCOUNTER — Emergency Department
Admission: EM | Admit: 2016-05-27 | Discharge: 2016-05-27 | Disposition: A | Discharge status: Home / Self Care | Payer: BLUE CROSS/BLUE SHIELD | Attending: Emergency Medicine | Admitting: Emergency Medicine

## 2016-05-27 ENCOUNTER — Emergency Department: Payer: BLUE CROSS/BLUE SHIELD

## 2016-05-27 ENCOUNTER — Encounter: Payer: Self-pay | Admitting: Emergency Medicine

## 2016-05-27 DIAGNOSIS — Z79899 Other long term (current) drug therapy: Secondary | ICD-10-CM | POA: Insufficient documentation

## 2016-05-27 DIAGNOSIS — R5383 Other fatigue: Secondary | ICD-10-CM | POA: Insufficient documentation

## 2016-05-27 DIAGNOSIS — F17200 Nicotine dependence, unspecified, uncomplicated: Secondary | ICD-10-CM | POA: Insufficient documentation

## 2016-05-27 DIAGNOSIS — R1030 Lower abdominal pain, unspecified: Secondary | ICD-10-CM

## 2016-05-27 DIAGNOSIS — R5381 Other malaise: Secondary | ICD-10-CM | POA: Insufficient documentation

## 2016-05-27 DIAGNOSIS — R1031 Right lower quadrant pain: Secondary | ICD-10-CM | POA: Insufficient documentation

## 2016-05-27 DIAGNOSIS — Z792 Long term (current) use of antibiotics: Secondary | ICD-10-CM | POA: Insufficient documentation

## 2016-05-27 LAB — CBC
HCT: 43.1 % (ref 40.0–52.0)
Hemoglobin: 14.9 g/dL (ref 13.0–18.0)
MCH: 32.1 pg (ref 26.0–34.0)
MCHC: 34.6 g/dL (ref 32.0–36.0)
MCV: 92.7 fL (ref 80.0–100.0)
PLATELETS: 207 10*3/uL (ref 150–440)
RBC: 4.65 MIL/uL (ref 4.40–5.90)
RDW: 12.5 % (ref 11.5–14.5)
WBC: 5.6 10*3/uL (ref 3.8–10.6)

## 2016-05-27 LAB — COMPREHENSIVE METABOLIC PANEL
ALBUMIN: 3.8 g/dL (ref 3.5–5.0)
ALK PHOS: 72 U/L (ref 38–126)
ALT: 26 U/L (ref 17–63)
AST: 24 U/L (ref 15–41)
Anion gap: 4 — ABNORMAL LOW (ref 5–15)
BILIRUBIN TOTAL: 0.5 mg/dL (ref 0.3–1.2)
BUN: 11 mg/dL (ref 6–20)
CALCIUM: 8.6 mg/dL — AB (ref 8.9–10.3)
CO2: 31 mmol/L (ref 22–32)
CREATININE: 1.22 mg/dL (ref 0.61–1.24)
Chloride: 105 mmol/L (ref 101–111)
GFR calc Af Amer: >60 mL/min (ref >60)
GLUCOSE: 93 mg/dL (ref 65–99)
POTASSIUM: 4.6 mmol/L (ref 3.5–5.1)
Sodium: 140 mmol/L (ref 135–145)
TOTAL PROTEIN: 6.1 g/dL — AB (ref 6.5–8.1)

## 2016-05-27 LAB — URINALYSIS COMPLETE WITH MICROSCOPIC (ARMC ONLY)
BACTERIA UA: NONE SEEN
BILIRUBIN URINE: NEGATIVE
GLUCOSE, UA: NEGATIVE mg/dL
HGB URINE DIPSTICK: NEGATIVE
KETONES UR: NEGATIVE mg/dL
LEUKOCYTES UA: NEGATIVE
NITRITE: NEGATIVE
Protein, ur: NEGATIVE mg/dL
SPECIFIC GRAVITY, URINE: 1.016 (ref 1.005–1.030)
Squamous Epithelial / LPF: NONE SEEN
pH: 5 (ref 5.0–8.0)

## 2016-05-27 LAB — LIPASE, BLOOD: Lipase: 53 U/L — ABNORMAL HIGH (ref 11–51)

## 2016-05-27 MED ORDER — ONDANSETRON 4 MG PO TBDP: ORAL_TABLET | ORAL | 0 refills | Status: DC

## 2016-05-27 MED ORDER — MORPHINE SULFATE (PF) 4 MG/ML IV SOLN
4.0000 mg | Freq: Once | INTRAVENOUS | Status: AC
  Administered 2016-05-27: 4 mg via INTRAVENOUS
  Filled 2016-05-27: qty 1

## 2016-05-27 MED ORDER — OXYCODONE-ACETAMINOPHEN 5-325 MG PO TABS
2.0000 | ORAL_TABLET | Freq: Once | ORAL | Status: AC
  Administered 2016-05-27: 2 via ORAL

## 2016-05-27 MED ORDER — DOCUSATE SODIUM 100 MG PO CAPS: ORAL_CAPSULE | ORAL | 0 refills | Status: DC

## 2016-05-27 MED ORDER — SODIUM CHLORIDE 0.9 % IV BOLUS (SEPSIS)
500.0000 mL | INTRAVENOUS | Status: AC
  Administered 2016-05-27: 500 mL via INTRAVENOUS

## 2016-05-27 MED ORDER — IOPAMIDOL (ISOVUE-300) INJECTION 61%
100.0000 mL | Freq: Once | INTRAVENOUS | Status: AC | PRN
  Administered 2016-05-27: 100 mL via INTRAVENOUS
  Filled 2016-05-27: qty 100

## 2016-05-27 MED ORDER — ONDANSETRON HCL 4 MG/2ML IJ SOLN
4.0000 mg | INTRAMUSCULAR | Status: AC
  Administered 2016-05-27: 4 mg via INTRAVENOUS
  Filled 2016-05-27: qty 2

## 2016-05-27 MED ORDER — OXYCODONE-ACETAMINOPHEN 5-325 MG PO TABS: 1.0000 | ORAL_TABLET | ORAL | 0 refills | Status: DC | PRN

## 2016-05-27 MED ORDER — IOPAMIDOL (ISOVUE-300) INJECTION 61%
30.0000 mL | Freq: Once | INTRAVENOUS | Status: AC | PRN
  Administered 2016-05-27: 30 mL via ORAL
  Filled 2016-05-27: qty 30

## 2016-05-27 NOTE — ED Provider Notes (Addendum)
[facility] Emergency Department Provider Note  ____________________________________________   First MD Initiated Contact with Patient 05/27/16 2156     (approximate)  I have reviewed the triage vital signs and the nursing notes.   HISTORY  Chief Complaint Abdominal Pain and Bleeding/Bruising    HPI Alex Wint. is a 35 y.o. male with a past medical history of hematemesis about 7 months ago for which no specific cause was ever found (he was that of a viral illness resulting in violent vomiting and a probable Mallory-Weiss tear).  He has been otherwise healthy and reports no chronic medical issues.  He presents today for evaluation of gradual onset and worsening lower abdominal pain that is in the middle of his abdomen and radiating to the right lower quadrant.  He has felt generally ill all day with fatigue and general malaise.  He has not had any nausea, vomiting, or diarrhea.  He states that the pain is gotten gradually worse and is now a constant aching pain as well as occasionally sharp and stabbing.  He noticed that he has some bruising in the middle right part of his abdomen but has no history of trauma to the affected area.  He denies fever/chills, chest pain, shortness of breath.  He states he occasionally drinks alcohol but has not had any for about a week.  He does use tobacco and denies any drug use.   History reviewed. No pertinent past medical history.  There are no active problems to display for this patient.   History reviewed. No pertinent surgical history.  Prior to Admission medications   Medication Sig Start Date End Date Taking? Authorizing Provider  azithromycin (ZITHROMAX) 250 MG tablet Take 250 mg by mouth daily.    Historical Provider, MD  chlorpheniramine (CHLOR-TRIMETON) 4 MG tablet Take 1 tablet (4 mg total) by mouth 2 (two) times daily as needed for allergies or rhinitis. 11/20/15   Charmayne Sheer Beers, PA-C  ciprofloxacin  (CIPRO) 500 MG tablet Take 1 tablet (500 mg total) by mouth 2 (two) times daily. 03/03/16   Jami L Hagler, PA-C  clindamycin (CLEOCIN) 300 MG capsule Take 1 capsule (300 mg total) by mouth 3 (three) times daily. 03/03/16   Jami L Hagler, PA-C  docusate sodium (COLACE) 100 MG capsule Take 1 tablet once or twice daily as needed for constipation while taking narcotic pain medicine 05/27/16   Loleta Rose, MD  guaiFENesin-codeine 100-10 MG/5ML syrup Take 10 mLs by mouth every 4 (four) hours as needed for cough. 11/20/15   Charmayne Sheer Beers, PA-C  ibuprofen (ADVIL,MOTRIN) 800 MG tablet Take 1 tablet (800 mg total) by mouth every 8 (eight) hours as needed. 11/20/15   Charmayne Sheer Beers, PA-C  levofloxacin (LEVAQUIN) 500 MG tablet Take 1 tablet (500 mg total) by mouth daily. 11/20/15   Evangeline Dakin, PA-C  ondansetron (ZOFRAN ODT) 4 MG disintegrating tablet Allow 1-2 tablets to dissolve in your mouth every 8 hours as needed for nausea/vomiting 05/27/16   Loleta Rose, MD  oxyCODONE-acetaminophen (ROXICET) 5-325 MG tablet Take 1-2 tablets by mouth every 4 (four) hours as needed for severe pain. 05/27/16   Loleta Rose, MD  promethazine (PHENERGAN) 25 MG tablet Take 1 tablet (25 mg total) by mouth every 6 (six) hours as needed for nausea or vomiting. 11/20/15   Minna Antis, MD    Allergies Penicillins  No family history on file.  Social History Social History  Substance Use Topics  . Smoking  status: Current Some Day Smoker  . Smokeless tobacco: Never Used  . Alcohol use Yes    Review of Systems Constitutional: No fever/chills Eyes: No visual changes. ENT: No sore throat. Cardiovascular: Denies chest pain. Respiratory: Denies shortness of breath. Gastrointestinal: +abdominal pain.  No nausea, no vomiting.  No diarrhea.  No constipation. Genitourinary: Negative for dysuria. Musculoskeletal: Negative for back pain. Skin: Negative for rash.  Spontaneous bruising on abdomen Neurological: Negative for  headaches, focal weakness or numbness.  10-point ROS otherwise negative.  ____________________________________________   PHYSICAL EXAM:  VITAL SIGNS: ED Triage Vitals  Enc Vitals Group     BP 05/27/16 2010 105/63     Pulse Rate 05/27/16 2010 83     Resp 05/27/16 2010 20     Temp 05/27/16 2010 98.4 F (36.9 C)     Temp Source 05/27/16 2010 Oral     SpO2 05/27/16 2010 97 %     Weight 05/27/16 2011 200 lb (90.7 kg)     Height 05/27/16 2011 6\' 1"  (1.854 m)     Head Circumference --      Peak Flow --      Pain Score 05/27/16 2011 8     Pain Loc --      Pain Edu? --      Excl. in GC? --     Constitutional: Alert and oriented. Well appearing and in no acute distress. Eyes: Conjunctivae are normal. PERRL. EOMI. Head: Atraumatic. Nose: No congestion/rhinnorhea. Mouth/Throat: Mucous membranes are moist.  Oropharynx non-erythematous. Neck: No stridor.  No meningeal signs.   Cardiovascular: Normal rate, regular rhythm. Good peripheral circulation. Grossly normal heart sounds. Respiratory: Normal respiratory effort.  No retractions. Lungs CTAB. Gastrointestinal: Soft With periumbilical and right lower quadrant tenderness to palpation.  The patient has some ecchymosis just right of center of his abdomen (right of his umbilicus) suggestive of Cullen's sign.  It is more medial than would be expected for Faythe Casa sign. Musculoskeletal: No lower extremity tenderness nor edema. No gross deformities of extremities. Neurologic:  Normal speech and language. No gross focal neurologic deficits are appreciated.  Skin:  Skin is warm, dry and intact. Ecchymosis on abdomen as noted above Psychiatric: Mood and affect are normal. Speech and behavior are normal.  ____________________________________________   LABS (all labs ordered are listed, but only abnormal results are displayed)  Labs Reviewed  LIPASE, BLOOD - Abnormal; Notable for the following:       Result Value   Lipase 53 (*)    All  other components within normal limits  COMPREHENSIVE METABOLIC PANEL - Abnormal; Notable for the following:    Calcium 8.6 (*)    Total Protein 6.1 (*)    Anion gap 4 (*)    All other components within normal limits  URINALYSIS COMPLETEWITH MICROSCOPIC ([facility] ONLY) - Abnormal; Notable for the following:    Color, Urine YELLOW (*)    APPearance CLEAR (*)    All other components within normal limits  CBC   ____________________________________________  EKG  None - EKG not ordered by ED physician ____________________________________________  RADIOLOGY Alex Wilkins, personally discussed these images and results by phone with the on-call radiologist and used this discussion as part of my medical decision making. He clarified that the Impression is missing the word "without", and it should read "minimally prominent appendix without definite appendiceal wall thickening...".   He told me by phone that the study is essentially unchanged from the prior study from about 6.5  months ago and definitely does not look like appendicitis.  Also confirmed by phone that there is no retroperitoneal hematoma nor sign of pancreatitis.   Ct Abdomen Pelvis W Contrast  Result Date: 05/27/2016 CLINICAL DATA:  Awoke today with severe RIGHT lower quadrant pain, RIGHT lower quadrant ecchymosis consistent withCullen sign, smoker EXAM: CT ABDOMEN AND PELVIS WITH CONTRAST TECHNIQUE: Multidetector CT imaging of the abdomen and pelvis was performed using the standard protocol following bolus administration of intravenous contrast. Sagittal and coronal MPR images reconstructed from [id number](32[facility], LKentucJoKinnie [id number] [id number] LKentucJoKinnie ScAnnabell [id number] WeKentucJoKinnie ScAnnabell Howe31mllsHMesSeren098119(61[facility] LKentucJoKinnie ScAnnabSeren098119(45[facility] HospitKentucJoKinnie ScAnnabellSeren09811942[facility] CentKentucJoKinnie ScSerenSeren098119(804[facility] HealKentucJoKinnie ScAnnabell Howe57mllsSageSelect [id number](989[facility] TarzanSeren09811943[facility] HospitKentucJoKinnie ScAnnabell Howe62mllsEndoscopSeren098119(97[facility] [id number] IKSerena C(843[facility] LKentuckyKinnie ScAnnabell HowellsArizona [id number] [id number] KentucJoKinnie ScAnnabell Howe48mllsCape RegionaVibra [id number]' Hospital For ChildrKentucJoKinnie ScAnnabellSerena C90[facility] Surgery [id number](22Clovis SurgerySeren09811971[facility] Endoscopy CentKentucJoKinnie ScAnnabell Howe21mllsRockville San Francisco Endoscopy MarylandCeMTilFay(210)[place] EnerLeodiDelia Chimesspitalinnie ScAnnabell Howe56mllsSoutheW5<MEASUREM LKoreDarVictoriKoreanoLe72 wKoreaDarVictoriKoreanoLe502[facility] ON 61% IV COMPARISON:  11/20/2015 FINDINGS: Lower chest:  Lung bases clear Hepatobiliary: Gallbladder contracted. Stable 5 mm low-attenuation focus lateral segment LEFT lobe liver image 20. No other focal hepatic  abnormalities. Pancreas: Normal appearance Spleen: Normal appearance Adrenals/Urinary Tract: Adrenal glands normal appearance. Kidneys, ureters, and bladder normal appearance Stomach/Bowel: Minimally prominent appendix without definite wall thickening or surrounding inflammatory changes, little changed from previous study. Air present within appendix. Stomach and bowel loops normal appearance. Vascular/Lymphatic: Vascular structures normal appearance. Scattered normal size mesenteric nodes without abdominal or pelvic adenopathy. Reproductive: N/A Other: No free air or free fluid. Small umbilical hernia containing fat. No anterior abdominal wall infiltrative changes. Musculoskeletal: Normal appearance IMPRESSION: Minimally prominent appendix bowel definite appendiceal wall thickening or periappendiceal inflammatory changes to suggest acute appendicitis. Small umbilical hernia containing fat. Remainder of exam shows no significant abnormalities. Electronically Signed   By: Mark  Boles M.D.   On: 05/27/2016 23:03     ____________________________________________   PROCEDURES  Procedure(s) performed:   Procedures   Critical Care performed: No ____________________________________________   INITIAL IMPRESSION / ASSESSMENT AND PLAN / ED COURSE  Pertinent labs & imaging results that were available during my care of the patient were reviewed by me and considered in my medical decision making (see chart for details).  The patient has a very slight elevation of his lipase at 53 and no other lab abnormalities, but the presence of the pain, increased very slight lipase elevation, and the spontaneous ecchymosis on his abdomen suggestive of Cullen's sign makes me suspect pancreatitis.  Given his tenderness I will evaluate with a CT scan of the abdomen and pelvis with oral and IV contrast for maximal evaluation.  ----------------------------------------- 11:33 PM on  05/27/2016 -----------------------------------------  CT scan was unremarkable.  No obvious explanation of the patient's symptoms.  I had a long talk with him and his father about the possible relevance of the ecchymosis (if it is, in fact, Cullen's sign).  I explained that his lipase is at the very upper limit of normal, but that at this point I have nothing for which I can admit him to the hospital given the lack of radiographic evidence of pancreatitis and no clear laboratory diagnosis.  But I gave him my usual and customary return precautions in addition to pain medication and Zofran and encouraged him to return if he develops new or worsening symptoms.  He and his father understand the plan.  ____________________________________________  FINAL CLINICAL IMPRESSION(S) / ED DIAGNOSES  Final diagnoses:  Lower abdominal pain     MEDICATIONS GIVEN DURING THIS VISIT:  Medications  morphine 4 MG/ML injection 4 mg (4 mg Intravenous Given 05/27/16 2216)  ondansetron (ZOFRAN) injection 4 mg (4 mg Intravenous Given 05/27/16 2214)  sodium chloride 0.9 % bolus 500 mL (0 mLs Intravenous Stopped 05/27/16 2240)  iopamidol (ISOVUE-300) 61 % injection 30 mL (30 mLs Oral Contrast  Given 05/27/16 2243)  iopamidol (ISOVUE-300) 61 % injection 100 mL (100 mLs Intravenous Contrast Given 05/27/16 2244)  oxyCODONE-acetaminophen (PERCOCET/ROXICET) 5-325 MG per tablet 2 tablet (2 tablets Oral Given 05/27/16 2322)     NEW OUTPATIENT MEDICATIONS STARTED DURING THIS VISIT:  Discharge Medication List as of 05/27/2016 11:27 PM    START taking these medications   Details  docusate sodium (COLACE) 100 MG capsule Take 1 tablet once or twice daily as needed for constipation while taking narcotic pain medicine, Print    ondansetron (ZOFRAN ODT) 4 MG disintegrating tablet Allow 1-2 tablets to dissolve in your mouth every 8 hours as needed for nausea/vomiting, Print        Discharge Medication List as of 05/27/2016 11:27 PM     CONTINUE these medications which have CHANGED   Details  oxyCODONE-acetaminophen (ROXICET) 5-325 MG tablet Take 1-2 tablets by mouth every 4 (four) hours as needed for severe pain., Starting Sun 05/27/2016, Print        Discharge Medication List as of 05/27/2016 11:27 PM    STOP taking these medications     HYDROcodone-acetaminophen (NORCO/VICODIN) 5-325 MG tablet Comments:  Reason for Stopping:       HYDROcodone-homatropine (HYCODAN) 5-1.5 MG/5ML syrup Comments:  Reason for Stopping:           Note:  This document was prepared using Dragon voice recognition software and may include unintentional dictation errors.    Loleta Roseory Kieu Quiggle, MD 05/27/16 [id number]    Loleta Roseory Phillippa Straub, MD 05/27/16 [phone]

## 2016-05-27 NOTE — ED Triage Notes (Signed)
Pt presents to ED with mid abd pain and fatigue all day. Denies nausea, vomiting, or diarrhea. Pt states this evening when he got into the shower he noticed bruising to the right side of his abd; tender to the touch. Denies recent MVC or trauma to the affected area.

## 2016-05-27 NOTE — Discharge Instructions (Signed)

## 2016-07-18 ENCOUNTER — Encounter: Payer: Self-pay | Admitting: Emergency Medicine

## 2016-07-18 ENCOUNTER — Inpatient Hospital Stay
Admission: EM | Admit: 2016-07-18 | Discharge: 2016-07-20 | DRG: 343 | Disposition: A | Discharge status: Home / Self Care | Payer: Self-pay | Attending: General Surgery | Admitting: General Surgery

## 2016-07-18 DIAGNOSIS — F419 Anxiety disorder, unspecified: Secondary | ICD-10-CM | POA: Diagnosis present

## 2016-07-18 DIAGNOSIS — R000 Tachycardia, unspecified: Secondary | ICD-10-CM | POA: Diagnosis present

## 2016-07-18 DIAGNOSIS — R7303 Prediabetes: Secondary | ICD-10-CM | POA: Diagnosis present

## 2016-07-18 DIAGNOSIS — I4891 Unspecified atrial fibrillation: Secondary | ICD-10-CM

## 2016-07-18 DIAGNOSIS — F17200 Nicotine dependence, unspecified, uncomplicated: Secondary | ICD-10-CM | POA: Diagnosis present

## 2016-07-18 DIAGNOSIS — Z791 Long term (current) use of non-steroidal anti-inflammatories (NSAID): Secondary | ICD-10-CM

## 2016-07-18 DIAGNOSIS — K3580 Unspecified acute appendicitis: Principal | ICD-10-CM | POA: Diagnosis present

## 2016-07-18 DIAGNOSIS — Z79891 Long term (current) use of opiate analgesic: Secondary | ICD-10-CM

## 2016-07-18 DIAGNOSIS — Z880 Allergy status to penicillin: Secondary | ICD-10-CM

## 2016-07-18 DIAGNOSIS — F1410 Cocaine abuse, uncomplicated: Secondary | ICD-10-CM | POA: Diagnosis present

## 2016-07-18 HISTORY — DX: Unspecified atrial fibrillation: I48.91

## 2016-07-18 LAB — COMPREHENSIVE METABOLIC PANEL
ALT: 28 U/L (ref 17–63)
AST: 26 U/L (ref 15–41)
Albumin: 4.9 g/dL (ref 3.5–5.0)
Alkaline Phosphatase: 71 U/L (ref 38–126)
Anion gap: 9 (ref 5–15)
BILIRUBIN TOTAL: 0.8 mg/dL (ref 0.3–1.2)
BUN: 18 mg/dL (ref 6–20)
CO2: 31 mmol/L (ref 22–32)
CREATININE: 1.34 mg/dL — AB (ref 0.61–1.24)
Calcium: 9.6 mg/dL (ref 8.9–10.3)
Chloride: 101 mmol/L (ref 101–111)
Glucose, Bld: 103 mg/dL — ABNORMAL HIGH (ref 65–99)
Potassium: 4.4 mmol/L (ref 3.5–5.1)
Sodium: 141 mmol/L (ref 135–145)
TOTAL PROTEIN: 8 g/dL (ref 6.5–8.1)

## 2016-07-18 LAB — CBC
HCT: 50.2 % (ref 40.0–52.0)
Hemoglobin: 16.6 g/dL (ref 13.0–18.0)
MCH: 30.9 pg (ref 26.0–34.0)
MCHC: 33 g/dL (ref 32.0–36.0)
MCV: 93.8 fL (ref 80.0–100.0)
PLATELETS: 227 10*3/uL (ref 150–440)
RBC: 5.36 MIL/uL (ref 4.40–5.90)
RDW: 12.3 % (ref 11.5–14.5)
WBC: 16.4 10*3/uL — AB (ref 3.8–10.6)

## 2016-07-18 LAB — LIPASE, BLOOD: Lipase: 43 U/L (ref 11–51)

## 2016-07-18 NOTE — ED Triage Notes (Signed)
Patient ambulatory to triage with steady gait, without difficulty or distress noted; reports mid upper abd pain today accomp by nausea

## 2016-07-19 ENCOUNTER — Inpatient Hospital Stay: Payer: Self-pay | Admitting: Anesthesiology

## 2016-07-19 ENCOUNTER — Emergency Department: Payer: Self-pay

## 2016-07-19 ENCOUNTER — Inpatient Hospital Stay
Admit: 2016-07-19 | Discharge: 2016-07-19 | Disposition: A | Discharge status: Home / Self Care | Payer: Self-pay | Attending: Internal Medicine | Admitting: Internal Medicine

## 2016-07-19 ENCOUNTER — Encounter
Admission: EM | Disposition: A | Discharge status: Home / Self Care | Payer: Self-pay | Source: Home / Self Care | Attending: General Surgery

## 2016-07-19 DIAGNOSIS — K353 Acute appendicitis with localized peritonitis, without perforation or gangrene: Secondary | ICD-10-CM | POA: Insufficient documentation

## 2016-07-19 DIAGNOSIS — K3580 Unspecified acute appendicitis: Principal | ICD-10-CM

## 2016-07-19 DIAGNOSIS — I4891 Unspecified atrial fibrillation: Secondary | ICD-10-CM

## 2016-07-19 HISTORY — PX: LAPAROSCOPIC APPENDECTOMY: SHX408

## 2016-07-19 LAB — URINALYSIS COMPLETE WITH MICROSCOPIC (ARMC ONLY)
BILIRUBIN URINE: NEGATIVE
Bacteria, UA: NONE SEEN
GLUCOSE, UA: NEGATIVE mg/dL
Hgb urine dipstick: NEGATIVE
LEUKOCYTES UA: NEGATIVE
Nitrite: NEGATIVE
Protein, ur: NEGATIVE mg/dL
RBC / HPF: NONE SEEN RBC/hpf (ref 0–5)
Specific Gravity, Urine: 1.043 — ABNORMAL HIGH (ref 1.005–1.030)
pH: 5 (ref 5.0–8.0)

## 2016-07-19 LAB — COMPREHENSIVE METABOLIC PANEL
ALBUMIN: 3.8 g/dL (ref 3.5–5.0)
ALT: 19 U/L (ref 17–63)
ANION GAP: 7 (ref 5–15)
AST: 24 U/L (ref 15–41)
Alkaline Phosphatase: 57 U/L (ref 38–126)
BUN: 10 mg/dL (ref 6–20)
CALCIUM: 8.6 mg/dL — AB (ref 8.9–10.3)
CO2: 29 mmol/L (ref 22–32)
CREATININE: 1.15 mg/dL (ref 0.61–1.24)
Chloride: 100 mmol/L — ABNORMAL LOW (ref 101–111)
GFR calc Af Amer: >60 mL/min (ref >60)
GFR calc non Af Amer: >60 mL/min (ref >60)
Glucose, Bld: 176 mg/dL — ABNORMAL HIGH (ref 65–99)
POTASSIUM: 3.5 mmol/L (ref 3.5–5.1)
SODIUM: 136 mmol/L (ref 135–145)
TOTAL PROTEIN: 6.5 g/dL (ref 6.5–8.1)
Total Bilirubin: 1 mg/dL (ref 0.3–1.2)

## 2016-07-19 LAB — URINE DRUG SCREEN, QUALITATIVE (ARMC ONLY)
AMPHETAMINES, UR SCREEN: NOT DETECTED
Amphetamines, Ur Screen: NOT DETECTED
BARBITURATES, UR SCREEN: NOT DETECTED
BARBITURATES, UR SCREEN: NOT DETECTED
BENZODIAZEPINE, UR SCRN: POSITIVE — AB
Benzodiazepine, Ur Scrn: POSITIVE — AB
COCAINE METABOLITE, UR ~~LOC~~: NOT DETECTED
Cannabinoid 50 Ng, Ur ~~LOC~~: NOT DETECTED
Cannabinoid 50 Ng, Ur ~~LOC~~: NOT DETECTED
Cocaine Metabolite,Ur ~~LOC~~: NOT DETECTED
MDMA (Ecstasy)Ur Screen: NOT DETECTED
MDMA (Ecstasy)Ur Screen: NOT DETECTED
METHADONE SCREEN, URINE: NOT DETECTED
METHADONE SCREEN, URINE: NOT DETECTED
OPIATE, UR SCREEN: POSITIVE — AB
Opiate, Ur Screen: NOT DETECTED
Phencyclidine (PCP) Ur S: NOT DETECTED
Phencyclidine (PCP) Ur S: NOT DETECTED
TRICYCLIC, UR SCREEN: NOT DETECTED
TRICYCLIC, UR SCREEN: NOT DETECTED

## 2016-07-19 LAB — SURGICAL PCR SCREEN
MRSA, PCR: NEGATIVE
Staphylococcus aureus: POSITIVE — AB

## 2016-07-19 LAB — ECHOCARDIOGRAM COMPLETE
Height: 73 in
Weight: 3088 oz

## 2016-07-19 LAB — TROPONIN I: Troponin I: <0.03 ng/mL (ref <0.03)

## 2016-07-19 SURGERY — APPENDECTOMY, LAPAROSCOPIC
Anesthesia: General

## 2016-07-19 MED ORDER — CIPROFLOXACIN IN D5W 400 MG/200ML IV SOLN
INTRAVENOUS | Status: AC
  Administered 2016-07-19: 400 mg via INTRAVENOUS
  Filled 2016-07-19: qty 200

## 2016-07-19 MED ORDER — HYDROMORPHONE HCL 1 MG/ML IJ SOLN
INTRAMUSCULAR | Status: AC
  Administered 2016-07-19: 0.5 mg via INTRAVENOUS
  Filled 2016-07-19: qty 1

## 2016-07-19 MED ORDER — MORPHINE SULFATE (PF) 2 MG/ML IV SOLN
INTRAVENOUS | Status: AC
  Administered 2016-07-19: 4 mg via INTRAVENOUS
  Filled 2016-07-19: qty 2

## 2016-07-19 MED ORDER — CIPROFLOXACIN IN D5W 400 MG/200ML IV SOLN
400.0000 mg | Freq: Two times a day (BID) | INTRAVENOUS | Status: DC
  Administered 2016-07-19 – 2016-07-20 (×2): 400 mg via INTRAVENOUS
  Filled 2016-07-19 (×4): qty 200

## 2016-07-19 MED ORDER — DIPHENHYDRAMINE HCL 50 MG/ML IJ SOLN: 25.0000 mg | Freq: Four times a day (QID) | INTRAMUSCULAR | Status: DC | PRN

## 2016-07-19 MED ORDER — DEXAMETHASONE SODIUM PHOSPHATE 10 MG/ML IJ SOLN
INTRAMUSCULAR | Status: DC | PRN
  Administered 2016-07-19: 10 mg via INTRAVENOUS

## 2016-07-19 MED ORDER — FENTANYL CITRATE (PF) 100 MCG/2ML IJ SOLN
25.0000 ug | INTRAMUSCULAR | Status: DC | PRN
  Administered 2016-07-19: 25 ug via INTRAVENOUS

## 2016-07-19 MED ORDER — HYDRALAZINE HCL 20 MG/ML IJ SOLN: 10.0000 mg | INTRAMUSCULAR | Status: DC | PRN

## 2016-07-19 MED ORDER — ONDANSETRON 4 MG PO TBDP: 4.0000 mg | ORAL_TABLET | Freq: Four times a day (QID) | ORAL | Status: DC | PRN

## 2016-07-19 MED ORDER — MORPHINE SULFATE (PF) 2 MG/ML IV SOLN
4.0000 mg | Freq: Once | INTRAVENOUS | Status: AC
  Administered 2016-07-19: 4 mg via INTRAVENOUS
  Filled 2016-07-19: qty 2

## 2016-07-19 MED ORDER — HYDROMORPHONE HCL 1 MG/ML IJ SOLN
0.5000 mg | INTRAMUSCULAR | Status: DC | PRN
Start: 2016-07-19 — End: 2016-07-20
  Administered 2016-07-19 (×3): 0.5 mg via INTRAVENOUS
  Filled 2016-07-19 (×3): qty 1

## 2016-07-19 MED ORDER — METOCLOPRAMIDE HCL 5 MG/ML IJ SOLN
10.0000 mg | Freq: Once | INTRAMUSCULAR | Status: AC
  Administered 2016-07-19: 10 mg via INTRAVENOUS

## 2016-07-19 MED ORDER — DILTIAZEM HCL 25 MG/5ML IV SOLN
10.0000 mg | Freq: Once | INTRAVENOUS | Status: AC
  Administered 2016-07-19: 10 mg via INTRAVENOUS
  Filled 2016-07-19: qty 5

## 2016-07-19 MED ORDER — LACTATED RINGERS IV SOLN
INTRAVENOUS | Status: DC
  Administered 2016-07-19: 18:00:00 via INTRAVENOUS

## 2016-07-19 MED ORDER — ONDANSETRON HCL 4 MG/2ML IJ SOLN
4.0000 mg | Freq: Four times a day (QID) | INTRAMUSCULAR | Status: DC | PRN
  Administered 2016-07-19: 4 mg via INTRAVENOUS

## 2016-07-19 MED ORDER — IOPAMIDOL (ISOVUE-300) INJECTION 61%
100.0000 mL | Freq: Once | INTRAVENOUS | Status: AC | PRN
  Administered 2016-07-19: 100 mL via INTRAVENOUS

## 2016-07-19 MED ORDER — SODIUM CHLORIDE 0.9 % IV BOLUS (SEPSIS)
1000.0000 mL | Freq: Once | INTRAVENOUS | Status: AC
  Administered 2016-07-19: 1000 mL via INTRAVENOUS

## 2016-07-19 MED ORDER — IPRATROPIUM-ALBUTEROL 0.5-2.5 (3) MG/3ML IN SOLN
RESPIRATORY_TRACT | Status: AC
  Filled 2016-07-19: qty 3

## 2016-07-19 MED ORDER — ROCURONIUM BROMIDE 100 MG/10ML IV SOLN
INTRAVENOUS | Status: DC | PRN
  Administered 2016-07-19: 10 mg via INTRAVENOUS
  Administered 2016-07-19: 40 mg via INTRAVENOUS
  Administered 2016-07-19: 20 mg via INTRAVENOUS

## 2016-07-19 MED ORDER — MIDAZOLAM HCL 2 MG/2ML IJ SOLN
INTRAMUSCULAR | Status: DC | PRN
  Administered 2016-07-19: 2 mg via INTRAVENOUS

## 2016-07-19 MED ORDER — LACTATED RINGERS IV SOLN
INTRAVENOUS | Status: DC
  Administered 2016-07-19: 05:00:00 via INTRAVENOUS

## 2016-07-19 MED ORDER — KETOROLAC TROMETHAMINE 30 MG/ML IJ SOLN
30.0000 mg | Freq: Four times a day (QID) | INTRAMUSCULAR | Status: DC
  Administered 2016-07-19 – 2016-07-20 (×5): 30 mg via INTRAVENOUS
  Filled 2016-07-19 (×5): qty 1

## 2016-07-19 MED ORDER — HYDROMORPHONE HCL 1 MG/ML IJ SOLN
0.5000 mg | Freq: Once | INTRAMUSCULAR | Status: AC
  Administered 2016-07-19: 0.5 mg via INTRAVENOUS

## 2016-07-19 MED ORDER — DIPHENHYDRAMINE HCL 25 MG PO CAPS: 25.0000 mg | ORAL_CAPSULE | Freq: Four times a day (QID) | ORAL | Status: DC | PRN

## 2016-07-19 MED ORDER — MORPHINE SULFATE (PF) 2 MG/ML IV SOLN
4.0000 mg | Freq: Once | INTRAVENOUS | Status: AC
  Administered 2016-07-19: 4 mg via INTRAVENOUS

## 2016-07-19 MED ORDER — DILTIAZEM HCL 25 MG/5ML IV SOLN
10.0000 mg | INTRAVENOUS | Status: DC | PRN
  Administered 2016-07-19: 10 mg via INTRAVENOUS
  Filled 2016-07-19 (×2): qty 5

## 2016-07-19 MED ORDER — ACETAMINOPHEN 10 MG/ML IV SOLN
INTRAVENOUS | Status: AC
  Filled 2016-07-19: qty 100

## 2016-07-19 MED ORDER — IOPAMIDOL (ISOVUE-300) INJECTION 61%
30.0000 mL | Freq: Once | INTRAVENOUS | Status: AC | PRN
  Administered 2016-07-19: 30 mL via ORAL

## 2016-07-19 MED ORDER — ONDANSETRON HCL 4 MG/2ML IJ SOLN
INTRAMUSCULAR | Status: AC
  Administered 2016-07-19: 4 mg via INTRAVENOUS
  Filled 2016-07-19: qty 2

## 2016-07-19 MED ORDER — SUGAMMADEX SODIUM 500 MG/5ML IV SOLN
INTRAVENOUS | Status: DC | PRN
  Administered 2016-07-19: 175 mg via INTRAVENOUS

## 2016-07-19 MED ORDER — CIPROFLOXACIN IN D5W 400 MG/200ML IV SOLN
400.0000 mg | Freq: Two times a day (BID) | INTRAVENOUS | Status: DC
Start: 2016-07-19 — End: 2016-07-19
  Administered 2016-07-19: 400 mg via INTRAVENOUS

## 2016-07-19 MED ORDER — FAMOTIDINE IN NACL 20-0.9 MG/50ML-% IV SOLN
20.0000 mg | Freq: Two times a day (BID) | INTRAVENOUS | Status: DC
  Administered 2016-07-19 – 2016-07-20 (×3): 20 mg via INTRAVENOUS
  Filled 2016-07-19 (×5): qty 50

## 2016-07-19 MED ORDER — NALOXONE HCL 0.4 MG/ML IJ SOLN
INTRAMUSCULAR | Status: DC | PRN
  Administered 2016-07-19 (×2): 50 ug via INTRAVENOUS

## 2016-07-19 MED ORDER — PROMETHAZINE HCL 25 MG/ML IJ SOLN: 6.2500 mg | INTRAMUSCULAR | Status: DC | PRN

## 2016-07-19 MED ORDER — METRONIDAZOLE IN NACL 5-0.79 MG/ML-% IV SOLN
500.0000 mg | Freq: Three times a day (TID) | INTRAVENOUS | Status: DC
  Administered 2016-07-19 – 2016-07-20 (×4): 500 mg via INTRAVENOUS
  Filled 2016-07-19 (×6): qty 100

## 2016-07-19 MED ORDER — FENTANYL CITRATE (PF) 100 MCG/2ML IJ SOLN
INTRAMUSCULAR | Status: DC | PRN
  Administered 2016-07-19: 100 ug via INTRAVENOUS
  Administered 2016-07-19: 150 ug via INTRAVENOUS
  Administered 2016-07-19 (×2): 50 ug via INTRAVENOUS

## 2016-07-19 MED ORDER — ACETAMINOPHEN 10 MG/ML IV SOLN
INTRAVENOUS | Status: DC | PRN
  Administered 2016-07-19: 1000 mg via INTRAVENOUS

## 2016-07-19 MED ORDER — ONDANSETRON HCL 4 MG/2ML IJ SOLN
4.0000 mg | Freq: Once | INTRAMUSCULAR | Status: AC
  Administered 2016-07-19: 4 mg via INTRAVENOUS
  Filled 2016-07-19: qty 2

## 2016-07-19 MED ORDER — LIDOCAINE HCL (CARDIAC) 20 MG/ML IV SOLN
INTRAVENOUS | Status: DC | PRN
  Administered 2016-07-19: 100 mg via INTRAVENOUS

## 2016-07-19 MED ORDER — ONDANSETRON HCL 4 MG/2ML IJ SOLN
4.0000 mg | Freq: Once | INTRAMUSCULAR | Status: AC
  Administered 2016-07-19: 4 mg via INTRAVENOUS

## 2016-07-19 MED ORDER — SUCCINYLCHOLINE CHLORIDE 20 MG/ML IJ SOLN
INTRAMUSCULAR | Status: DC | PRN
  Administered 2016-07-19: 100 mg via INTRAVENOUS

## 2016-07-19 MED ORDER — BUPIVACAINE-EPINEPHRINE 0.25% -1:200000 IJ SOLN
INTRAMUSCULAR | Status: DC | PRN
  Administered 2016-07-19: 20 mL

## 2016-07-19 MED ORDER — METRONIDAZOLE IN NACL 5-0.79 MG/ML-% IV SOLN
500.0000 mg | Freq: Three times a day (TID) | INTRAVENOUS | Status: DC
  Filled 2016-07-19: qty 100

## 2016-07-19 MED ORDER — OXYCODONE HCL 5 MG PO TABS
5.0000 mg | ORAL_TABLET | ORAL | Status: DC | PRN
  Administered 2016-07-19: 5 mg via ORAL
  Filled 2016-07-19: qty 1

## 2016-07-19 MED ORDER — IPRATROPIUM-ALBUTEROL 0.5-2.5 (3) MG/3ML IN SOLN
3.0000 mL | Freq: Once | RESPIRATORY_TRACT | Status: AC
  Administered 2016-07-19: 3 mL via RESPIRATORY_TRACT

## 2016-07-19 MED ORDER — PROPOFOL 10 MG/ML IV BOLUS
INTRAVENOUS | Status: DC | PRN
  Administered 2016-07-19: 200 mg via INTRAVENOUS

## 2016-07-19 SURGICAL SUPPLY — 36 items
CANISTER SUCT 1200ML W/VALVE (MISCELLANEOUS) ×3 IMPLANT
CHLORAPREP W/TINT 26ML (MISCELLANEOUS) ×3 IMPLANT
CUTTER FLEX LINEAR 45M (STAPLE) ×3 IMPLANT
ELECT CAUTERY BLADE 6.4 (BLADE) ×3 IMPLANT
ELECT REM PT RETURN 9FT ADLT (ELECTROSURGICAL) ×3
ELECTRODE REM PT RTRN 9FT ADLT (ELECTROSURGICAL) ×1 IMPLANT
ENDOPOUCH RETRIEVER 10 (MISCELLANEOUS) ×3 IMPLANT
GLOVE SURG SYN 7.0 (GLOVE) ×3 IMPLANT
GLOVE SURG SYN 7.5  E (GLOVE) ×2
GLOVE SURG SYN 7.5 E (GLOVE) ×1 IMPLANT
GOWN STRL REUS W/ TWL LRG LVL3 (GOWN DISPOSABLE) ×2 IMPLANT
GOWN STRL REUS W/TWL LRG LVL3 (GOWN DISPOSABLE) ×4
IRRIGATION STRYKERFLOW (MISCELLANEOUS) ×1 IMPLANT
IRRIGATOR STRYKERFLOW (MISCELLANEOUS) ×3
IV NS 1000ML (IV SOLUTION) ×2
IV NS 1000ML BAXH (IV SOLUTION) ×1 IMPLANT
KIT RM TURNOVER STRD PROC AR (KITS) ×3 IMPLANT
LABEL OR SOLS (LABEL) IMPLANT
LIGASURE MARYLAND LAP STAND (ELECTROSURGICAL) IMPLANT
LIQUID BAND (GAUZE/BANDAGES/DRESSINGS) ×3 IMPLANT
NDL HPO THNWL 1X22GA REG BVL (NEEDLE) ×1 IMPLANT
NEEDLE SAFETY 22GX1 (NEEDLE) ×2
NS IRRIG 500ML POUR BTL (IV SOLUTION) ×3 IMPLANT
PACK LAP CHOLECYSTECTOMY (MISCELLANEOUS) ×3 IMPLANT
PENCIL ELECTRO HAND CTR (MISCELLANEOUS) ×3 IMPLANT
RELOAD STAPLE TA45 3.5 REG BLU (ENDOMECHANICALS) ×3 IMPLANT
SCISSORS METZENBAUM CVD 33 (INSTRUMENTS) ×3 IMPLANT
SLEEVE ADV FIXATION 5X100MM (TROCAR) ×6 IMPLANT
SUT MNCRL 4-0 (SUTURE) ×2
SUT MNCRL 4-0 27XMFL (SUTURE) ×1
SUT VICRYL 0 AB UR-6 (SUTURE) ×3 IMPLANT
SUTURE MNCRL 4-0 27XMF (SUTURE) ×1 IMPLANT
TRAY FOLEY W/METER SILVER 16FR (SET/KITS/TRAYS/PACK) ×3 IMPLANT
TROCAR XCEL BLUNT TIP 100MML (ENDOMECHANICALS) ×3 IMPLANT
TROCAR Z-THREAD OPTICAL 5X100M (TROCAR) ×3 IMPLANT
TUBING INSUFFLATOR HI FLOW (MISCELLANEOUS) ×3 IMPLANT

## 2016-07-19 NOTE — Progress Notes (Signed)
07/19/2016  Subjective: Patient admitted overnight with acute appendicitis. This was complicated with atrial fibrillation with RVR. Medicine team was consulted and recommended IV diltiazem when necessary and an echocardiogram has been ordered to evaluate for any valvular abnormality. Troponins thus far has been negative 2. Patient reports that he still having lower abdominal pain.  Vital signs: Temp:  [97.7 F (36.5 C)-98.8 F (37.1 C)] 98.8 F (37.1 C) (10/26 0502) Pulse Rate:  [83-146] 108 (10/26 0502) Resp:  [14-31] 18 (10/26 0502) BP: (95-151)/(56-98) 95/57 (10/26 0502) SpO2:  [91 %-100 %] 100 % (10/26 0502) Weight:  [87.5 kg (193 lb)-90.7 kg (200 lb)] 87.5 kg (193 lb) (10/26 0502)   Intake/Output: 10/25 0701 - 10/26 0700 In: 231.6 [I.V.:108.8; IV Piggyback:122.9] Out: 200 [Emesis/NG output:200]    Physical Exam: Constitutional: No acute distress, patient sleeping Cardiac:  Atrial fibrillation with heart rate in the 130s just informed by nurse Abdomen: Soft, nondistended, with tenderness to palpation the low pelvic area which is where the appendix is located on CT scan.  Labs:   Recent Labs  07/18/16 2331  WBC 16.4*  HGB 16.6  HCT 50.2  PLT 227    Recent Labs  07/18/16 2331  NA 141  K 4.4  CL 101  CO2 31  GLUCOSE 103*  BUN 18  CREATININE 1.34*  CALCIUM 9.6   No results for input(s): LABPROT, INR in the last 72 hours.  Imaging: Ct Abdomen Pelvis W Contrast  Result Date: 07/19/2016 CLINICAL DATA:  Mid upper abdominal pain with nausea EXAM: CT ABDOMEN AND PELVIS WITH CONTRAST TECHNIQUE: Multidetector CT imaging of the abdomen and pelvis was performed using the standard protocol following bolus administration of intravenous contrast. CONTRAST:  ISOVUE-300 IOPAMIDOL (ISOVUE-300) INJECTION 61% COMPARISON:  05/27/2016, 11/20/2015 FINDINGS: Lower chest: Mild dependent atelectasis. No acute infiltrate or consolidation. Hepatobiliary: A 5 mm hypodense lesion  with in the lateral segment of the left hepatic lobe is unchanged. There are no additional focal hepatic abnormalities. Gallbladder is unremarkable. No biliary dilatation. Pancreas: Unremarkable. No pancreatic ductal dilatation or surrounding inflammatory changes. Spleen: Normal in size without focal abnormality. Adrenals/Urinary Tract: Adrenal glands are within normal limits. Tiny cortical hypodensities in the right kidney too small to further characterize. A 1.1 cm hypodense lesion in the upper pole is unchanged. Bladder is unremarkable. Stomach/Bowel: The stomach is within normal limits. No dilated small bowel to suggest an obstruction. The appendix is identified and appears enlarged compared the prior exam, appendix diameter measures up to 11 mm. Tiny gas present within the tip of the appendix. Mild amount of high density material at the appendiceal tip could relate to small stone. There is additional focal density within the proximal lumen of the appendix measuring 8 mm which could represent an appendicolith. There is not much inflammation surrounding the appendix. Of note, the appendix size was noted to be larger than 6 mm on the previous exams. Vascular/Lymphatic: No significant vascular findings are present. Mildly prominent right lower quadrant lymph nodes. Reproductive: Prostate is unremarkable. Other: No free air or free fluid Musculoskeletal: Small sclerotic foci in the pelvis unchanged, likely bone islands. IMPRESSION: 1. The appendix appears enlarged compared to the previous exams, measuring up to 11 mm, probable appendicoliths within the tip of the appendix and the proximal lumen ; although there is not much substantial inflammation around the appendix, given the interval change in appearance, appendicitis is a concern. No perforation or abscess. 2. Mild right lower quadrant mesenteric adenopathy, likely reactive. 3. Stable  5 mm hypodense lesion in the left lobe of the liver. 4. Stable hypodense  lesion within the right kidney. Electronically Signed   By: Jasmine PangKim  Fujinaga M.D.   On: 07/19/2016 02:46    Assessment/Plan: 35 year old male with acute appendicitis complicated by atrial fibrillation with RVR. Tox screen was negative for cocaine although the patient does report having used cocaine 1 week ago.  -We'll give 1 dose of IV diltiazem per when necessary orders 1 given his elevated heart rate and atrial fibrillation. -Continue nothing by mouth with IV fluid hydration and IV antibiotics. -Third troponin pending for this morning. -Follow-up echocardiogram today. -Pending results of these further studies patient may be taken to the operating room today for laparoscopic appendectomy.   Howie IllJose Luis Martie Muhlbauer, MD [facility]

## 2016-07-19 NOTE — ED Provider Notes (Signed)
[facility] Emergency Department Provider Note    First MD Initiated Contact with Patient 07/18/16 2357     (approximate)  I have reviewed the triage vital signs and the nursing notes.   HISTORY  Chief Complaint Abdominal Pain   HPI Alex Wilkins. is a 35 y.o. male presents with 10 out of 10 abdominal pain with onset today. Patient admits to nausea and vomiting as well. Patient denies any fever temperature 97.7. Patient denies any diarrhea or constipation. Patient noted to be in A. fib with RVR on presentation to emergency department rate of 146 and variable with no previous history of A. Fib. Patient states that last cocaine use was one week ago. Patient denies any chest pain or shortness of breath no dizziness   Past medical history Cocaine abuse Patient Active Problem List   Diagnosis Date Noted  . Appendicitis 07/19/2016  . Acute appendicitis with localized peritonitis     History reviewed. No pertinent surgical history.  Prior to Admission medications   Medication Sig Start Date End Date Taking? Authorizing Provider  azithromycin (ZITHROMAX) 250 MG tablet Take 250 mg by mouth daily.    Historical Provider, MD  chlorpheniramine (CHLOR-TRIMETON) 4 MG tablet Take 1 tablet (4 mg total) by mouth 2 (two) times daily as needed for allergies or rhinitis. 11/20/15   Charmayne Sheer Beers, PA-C  ciprofloxacin (CIPRO) 500 MG tablet Take 1 tablet (500 mg total) by mouth 2 (two) times daily. 03/03/16   Jami L Hagler, PA-C  clindamycin (CLEOCIN) 300 MG capsule Take 1 capsule (300 mg total) by mouth 3 (three) times daily. 03/03/16   Jami L Hagler, PA-C  docusate sodium (COLACE) 100 MG capsule Take 1 tablet once or twice daily as needed for constipation while taking narcotic pain medicine 05/27/16   Loleta Rose, MD  guaiFENesin-codeine 100-10 MG/5ML syrup Take 10 mLs by mouth every 4 (four) hours as needed for cough. 11/20/15   Charmayne Sheer Beers, PA-C  ibuprofen  (ADVIL,MOTRIN) 800 MG tablet Take 1 tablet (800 mg total) by mouth every 8 (eight) hours as needed. 11/20/15   Charmayne Sheer Beers, PA-C  levofloxacin (LEVAQUIN) 500 MG tablet Take 1 tablet (500 mg total) by mouth daily. 11/20/15   Evangeline Dakin, PA-C  ondansetron (ZOFRAN ODT) 4 MG disintegrating tablet Allow 1-2 tablets to dissolve in your mouth every 8 hours as needed for nausea/vomiting 05/27/16   Loleta Rose, MD  oxyCODONE-acetaminophen (ROXICET) 5-325 MG tablet Take 1-2 tablets by mouth every 4 (four) hours as needed for severe pain. 05/27/16   Loleta Rose, MD  promethazine (PHENERGAN) 25 MG tablet Take 1 tablet (25 mg total) by mouth every 6 (six) hours as needed for nausea or vomiting. 11/20/15   Minna Antis, MD    Allergies Penicillins  No family history on file.  Social History Social History  Substance Use Topics  . Smoking status: Current Some Day Smoker  . Smokeless tobacco: Never Used  . Alcohol use Yes    Review of Systems Constitutional: No fever/chills Eyes: No visual changes. ENT: No sore throat. Cardiovascular: Denies chest pain. Respiratory: Denies shortness of breath. Gastrointestinal: Positive for abdominal pain and vomiting  Genitourinary: Negative for dysuria. Musculoskeletal: Negative for back pain. Skin: Negative for rash. Neurological: Negative for headaches, focal weakness or numbness.  10-point ROS otherwise negative.  ____________________________________________   PHYSICAL EXAM:  VITAL SIGNS: ED Triage Vitals  Enc Vitals Group     BP 07/18/16 2329 (!) 151/93  Pulse Rate 07/18/16 2329 83     Resp 07/18/16 2329 18     Temp 07/18/16 2329 97.7 F (36.5 C)     Temp Source 07/18/16 2329 Oral     SpO2 07/18/16 2329 98 %     Weight 07/18/16 2327 200 lb (90.7 kg)     Height 07/18/16 2327 6\' 1"  (1.854 m)     Head Circumference --      Peak Flow --      Pain Score 07/18/16 2327 8     Pain Loc --      Pain Edu? --      Excl. in GC? --      Constitutional: Alert and oriented. Well appearing and in no acute distress. Eyes: Conjunctivae are normal. PERRL. EOMI. Head: Atraumatic.Marland Kitchen. Mouth/Throat: Mucous membranes are moist.  Oropharynx non-erythematous. Neck: No stridor.  No meningeal signs.  No cervical spine tenderness to palpation. Cardiovascular: Irregular regular rhythm, tachycardia Good peripheral circulation. Grossly normal heart sounds. Respiratory: Normal respiratory effort.  No retractions. Lungs CTAB. Gastrointestinal: Right lower quadrant tenderness to palpation No distention.  Musculoskeletal: No lower extremity tenderness nor edema. No gross deformities of extremities. Neurologic:  Normal speech and language. No gross focal neurologic deficits are appreciated.  Skin:  Skin is warm, dry and intact. No rash noted. Psychiatric: Mood and affect are normal. Speech and behavior are normal.  ____________________________________________   LABS (all labs ordered are listed, but only abnormal results are displayed)  Labs Reviewed  COMPREHENSIVE METABOLIC PANEL - Abnormal; Notable for the following:       Result Value   Glucose, Bld 103 (*)    Creatinine, Ser 1.34 (*)    All other components within normal limits  CBC - Abnormal; Notable for the following:    WBC 16.4 (*)    All other components within normal limits  URINALYSIS COMPLETEWITH MICROSCOPIC ([facility] ONLY) - Abnormal; Notable for the following:    Color, Urine YELLOW (*)    APPearance CLEAR (*)    Ketones, ur 2+ (*)    Specific Gravity, Urine 1.043 (*)    Squamous Epithelial / LPF 0-5 (*)    All other components within normal limits  URINE DRUG SCREEN, QUALITATIVE ([facility] ONLY) - Abnormal; Notable for the following:    Opiate, Ur Screen POSITIVE (*)    Benzodiazepine, Ur Scrn POSITIVE (*)    All other components within normal limits  LIPASE, BLOOD  TROPONIN I  TROPONIN I  TROPONIN I  TROPONIN I    ____________________________________________  EKG  ED ECG REPORT I, [place] N Roderica Cathell, the attending physician, personally viewed and interpreted this ECG.   Date: 07/19/2016  EKG Time: 11:35 PM  Rate: 91  Rhythm: Irregular regular rhythm consistent with atrial fibrillation  Axis: Normal  Intervals: Normal  ST&T Change: None  ____________________________________________  RADIOLOGY I, [place] N Tildon Silveria, personally viewed and evaluated these images (plain radiographs) as part of my medical decision making, as well as reviewing the written report by the radiologist.  Ct Abdomen Pelvis W Contrast  Result Date: 07/19/2016 CLINICAL DATA:  Mid upper abdominal pain with nausea EXAM: CT ABDOMEN AND PELVIS WITH CONTRAST TECHNIQUE: Multidetector CT imaging of the abdomen and pelvis was performed using the standard protocol following bolus administration of intravenous contrast. CONTRAST:  100mL ISOVUE-300 IOPAMIDOL (ISOVUE-300) INJECTION 61% COMPARISON:  05/27/2016, 11/20/2015 FINDINGS: Lower chest: Mild dependent atelectasis. No acute infiltrate or consolidation. Hepatobiliary: A 5 mm hypodense lesion with in the lateral segment of  the left hepatic lobe is unchanged. There are no additional focal hepatic abnormalities. Gallbladder is unremarkable. No biliary dilatation. Pancreas: Unremarkable. No pancreatic ductal dilatation or surrounding inflammatory changes. Spleen: Normal in size without focal abnormality. Adrenals/Urinary Tract: Adrenal glands are within normal limits. Tiny cortical hypodensities in the right kidney too small to further characterize. A 1.1 cm hypodense lesion in the upper pole is unchanged. Bladder is unremarkable. Stomach/Bowel: The stomach is within normal limits. No dilated small bowel to suggest an obstruction. The appendix is identified and appears enlarged compared the prior exam, appendix diameter measures up to 11 mm. Tiny gas present within the tip of the appendix. Mild  amount of high density material at the appendiceal tip could relate to small stone. There is additional focal density within the proximal lumen of the appendix measuring 8 mm which could represent an appendicolith. There is not much inflammation surrounding the appendix. Of note, the appendix size was noted to be larger than 6 mm on the previous exams. Vascular/Lymphatic: No significant vascular findings are present. Mildly prominent right lower quadrant lymph nodes. Reproductive: Prostate is unremarkable. Other: No free air or free fluid Musculoskeletal: Small sclerotic foci in the pelvis unchanged, likely bone islands. IMPRESSION: 1. The appendix appears enlarged compared to the previous exams, measuring up to 11 mm, probable appendicoliths within the tip of the appendix and the proximal lumen ; although there is not much substantial inflammation around the appendix, given the interval change in appearance, appendicitis is a concern. No perforation or abscess. 2. Mild right lower quadrant mesenteric adenopathy, likely reactive. 3. Stable 5 mm hypodense lesion in the left lobe of the liver. 4. Stable hypodense lesion within the right kidney. Electronically Signed   By: Jasmine Pang M.D.   On: 07/19/2016 02:46      Procedures   Critical Care performed: CRITICAL CARE Performed by: Darci Current   Total critical care time: 45 minutes  Critical care time was exclusive of separately billable procedures and treating other patients.  Critical care was necessary to treat or prevent imminent or life-threatening deterioration.  Critical care was time spent personally by me on the following activities: development of treatment plan with patient and/or surrogate as well as nursing, discussions with consultants, evaluation of patient's response to treatment, examination of patient, obtaining history from patient or surrogate, ordering and performing treatments and interventions, ordering and review of  laboratory studies, ordering and review of radiographic studies, pulse oximetry and re-evaluation of patient's condition. ____________________________________________   INITIAL IMPRESSION / ASSESSMENT AND PLAN / ED COURSE  Pertinent labs & imaging results that were available during my care of the patient were reviewed by me and considered in my medical decision making (see chart for details).  History physical exam concerning for appendicitis as such CT scan of the abdomen performed which revealed appendicitis. Patient was given morphine with improvement of pain.  Regarding new onset H of fibrillation with rapid ventricular response patient was given diltiazem 10 mg IV with resultant improvement of rate.  Patient discussed with Dr. Tonita Cong general surgeon on call who will admit the patient.  Patient discussed with Dr. Tobi Bastos hospitals on call who will consult on the patient.    Clinical Course    ____________________________________________  FINAL CLINICAL IMPRESSION(S) / ED DIAGNOSES  Final diagnoses:  Acute appendicitis, unspecified acute appendicitis type  Atrial fibrillation with rapid ventricular response (HCC)     MEDICATIONS GIVEN DURING THIS VISIT:  Medications  ciprofloxacin (CIPRO) IVPB 400 mg (  400 mg Intravenous New Bag/Given 07/19/16 0356)  metroNIDAZOLE (FLAGYL) IVPB 500 mg (not administered)  diltiazem (CARDIZEM) injection 10 mg (not administered)  morphine 2 MG/ML injection 4 mg (4 mg Intravenous Given 07/19/16 0021)  ondansetron (ZOFRAN) injection 4 mg (4 mg Intravenous Given 07/19/16 0021)  sodium chloride 0.9 % bolus 1,000 mL (0 mLs Intravenous Stopped 07/19/16 0206)  iopamidol (ISOVUE-300) 61 % injection 30 mL (30 mLs Oral Contrast Given 07/19/16 0042)  ondansetron (ZOFRAN) injection 4 mg (4 mg Intravenous Given 07/19/16 0052)  morphine 2 MG/ML injection 4 mg (4 mg Intravenous Given 07/19/16 0053)  iopamidol (ISOVUE-300) 61 % injection 100 mL (100 mLs  Intravenous Contrast Given 07/19/16 0149)  metoCLOPramide (REGLAN) injection 10 mg (10 mg Intravenous Given 07/19/16 0211)  sodium chloride 0.9 % bolus 1,000 mL (1,000 mLs Intravenous New Bag/Given 07/19/16 0317)  HYDROmorphone (DILAUDID) injection 0.5 mg (0.5 mg Intravenous Given 07/19/16 0332)  diltiazem (CARDIZEM) injection 10 mg (10 mg Intravenous Given 07/19/16 0336)     NEW OUTPATIENT MEDICATIONS STARTED DURING THIS VISIT:  New Prescriptions   No medications on file    Modified Medications   No medications on file    Discontinued Medications   No medications on file     Note:  This document was prepared using Dragon voice recognition software and may include unintentional dictation errors.    Darci Current, MD 07/19/16 [phone]

## 2016-07-19 NOTE — ED Notes (Addendum)
Patient back from CT pt states vomited after CT done.

## 2016-07-19 NOTE — H&P (Signed)
Patient ID: Alex NeerKenneth G Leatherbury Jr., male   DOB: 02/14/1981, 35 y.o.   MRN: [id number]  CC: Abdominal pain  HPI Alex NeerKenneth G Stadel Jr. is a 35 y.o. male who presents to the emergency department with a one-day history of abdominal pain. Patient states that the pain started approximately 12 hours ago. 2 hours after the pain began he developed nausea and vomiting. He denies any fevers, chills, chest pain, shortness of breath, diarrhea, constipation. He's never had anything like this before and denies any recent illness or recent sick contact. He did just start a new job. The pain started in his right lower quadrant and has remained in his right lower quadrant since it began. It does not radiate and has not moved. He is otherwise in his usual state of health. He admits to a history of cocaine use but states his last usage was 1 week ago.  HPI  Past medical history: Anxiety, Was told he is prediabetic, not currently on any chronic medications.  Past surgical history: Patient states he's never had any abdominal surgery before.  Family history: Father with a history of heart block but no known history of myocardial infarction or stroke. No known family histories of cancer or diabetes.  Social History Social History  Substance Use Topics  . Smoking status: Current Some Day Smoker  . Smokeless tobacco: Never Used  . Alcohol use Yes  Admits to cocaine use.  Allergies  Allergen Reactions  . Penicillins Hives    Current Facility-Administered Medications  Medication Dose Route Frequency Provider Last Rate Last Dose  . ciprofloxacin (CIPRO) IVPB 400 mg  400 mg Intravenous Q12H Ricarda Frameharles Dorotea Hand, MD      . metroNIDAZOLE (FLAGYL) IVPB 500 mg  500 mg Intravenous Q8H Ricarda Frameharles Tait Balistreri, MD       Current Outpatient Prescriptions  Medication Sig Dispense Refill  . azithromycin (ZITHROMAX) 250 MG tablet Take 250 mg by mouth daily.    . chlorpheniramine (CHLOR-TRIMETON) 4 MG tablet Take 1 tablet (4 mg total) by  mouth 2 (two) times daily as needed for allergies or rhinitis. 30 tablet 0  . ciprofloxacin (CIPRO) 500 MG tablet Take 1 tablet (500 mg total) by mouth 2 (two) times daily. 20 tablet 0  . clindamycin (CLEOCIN) 300 MG capsule Take 1 capsule (300 mg total) by mouth 3 (three) times daily. 30 capsule 0  . docusate sodium (COLACE) 100 MG capsule Take 1 tablet once or twice daily as needed for constipation while taking narcotic pain medicine 30 capsule 0  . guaiFENesin-codeine 100-10 MG/5ML syrup Take 10 mLs by mouth every 4 (four) hours as needed for cough. 180 mL 0  . ibuprofen (ADVIL,MOTRIN) 800 MG tablet Take 1 tablet (800 mg total) by mouth every 8 (eight) hours as needed. 30 tablet 0  . levofloxacin (LEVAQUIN) 500 MG tablet Take 1 tablet (500 mg total) by mouth daily. 10 tablet 0  . ondansetron (ZOFRAN ODT) 4 MG disintegrating tablet Allow 1-2 tablets to dissolve in your mouth every 8 hours as needed for nausea/vomiting 30 tablet 0  . oxyCODONE-acetaminophen (ROXICET) 5-325 MG tablet Take 1-2 tablets by mouth every 4 (four) hours as needed for severe pain. 30 tablet 0  . promethazine (PHENERGAN) 25 MG tablet Take 1 tablet (25 mg total) by mouth every 6 (six) hours as needed for nausea or vomiting. 20 tablet 0     Review of Systems A Multi-point review of systems was asked and was negative except for the findings documented in  the history of present illness  Physical Exam Blood pressure 128/83, pulse (!) 130, temperature 97.7 F (36.5 C), temperature source Oral, resp. rate 18, height 6\' 1"  (1.854 m), weight 90.7 kg (200 lb), SpO2 96 %. CONSTITUTIONAL: Resting in bed in no acute distress. EYES: Pupils are equal, round, and reactive to light, Sclera are non-icteric. EARS, NOSE, MOUTH AND THROAT: The oropharynx is clear. The oral mucosa is pink and moist. Hearing is intact to voice. LYMPH NODES:  Lymph nodes in the neck are normal. RESPIRATORY:  Lungs are clear. There is normal respiratory  effort, with equal breath sounds bilaterally, and without pathologic use of accessory muscles. CARDIOVASCULAR: Heart is tachycardic and irregularly irregular. GI: The abdomen is soft, exquisitely tender to palpation in the right lower quadrant with a positive McBurney sign and a negative Rovsing's and psoas signs, and nondistended. There are no palpable masses. There is no hepatosplenomegaly. There are normal bowel sounds in all quadrants. GU: Rectal deferred.   MUSCULOSKELETAL: Normal muscle strength and tone. No cyanosis or edema.   SKIN: Turgor is good and there are no pathologic skin lesions or ulcers. NEUROLOGIC: Motor and sensation is grossly normal. Cranial nerves are grossly intact. PSYCH:  Oriented to person, place and time. Affect is normal.  Data Reviewed Images and labs reviewed, labs concerning for leukocytosis of 16.4, mildly elevated creatinine of 1.34. CT scan reviewed which shows a dilated, thickened appendix with a visualized fecalith in the mid body of the appendix. There is some periappendiceal stranding consistent with acute appendicitis. There is no free fluid, free air, abscess seen within the abdomen or pelvis. I have personally reviewed the patient's imaging, laboratory findings and medical records.    Assessment    Acute appendicitis A. fib with RVR    Plan    35 year old male with acute appendicitis. Discussed with the patient that the standard of care for appendicitis and I states is to remove the appendix. However, given his self-reported cocaine use and his new onset A. fib with RVR stated that going to the operating room at this time would carry with it unnecessary risks. However, should he worsen at any time he can be daily operating room emergently for the treatment of his intra-abdominal infection. Discussed at length the plan would be for him to be admitted to the hospital, kept nothing by mouth, started on IV antibiotics of Cipro and Flagyl, kept hydrated  through IV fluids, evaluated by internal medicine and treated for his A. fib with hopes of rate control or spontaneous conversion to normal rhythm to allow him to go to the operating room later today. The procedure of an appendectomy was described. Internal medicine has are even called for an inpatient consult to assist with the management of his A. fib.     Time spent with the patient was 50 minutes, with more than 50% of the time spent in face-to-face education, counseling and care coordination.     [name], MD FACS General Surgeon 07/19/2016, 3:44 AM

## 2016-07-19 NOTE — OR Nursing (Signed)
MD notified of positive PCR screen. No new orders at this time

## 2016-07-19 NOTE — Progress Notes (Signed)
*  PRELIMINARY RESULTS* Echocardiogram 2D Echocardiogram has been performed.  Cristela BlueHege, Alverna Fawley 07/19/2016, 11:51 AM

## 2016-07-19 NOTE — ED Notes (Signed)
Last meal was 07/18/16 around 5pm

## 2016-07-19 NOTE — Progress Notes (Signed)
[facility] - [place] at [facility]   PATIENT NAME: Alex Wilkins    MR#:  [id number]  DATE OF BIRTH:  [birth year]  SUBJECTIVE:  CHIEF COMPLAINT:  Patient is reporting abdominal pain. Denies any palpitations or chest pain. Denies any shortness of breath. No past medical history of atrial fibrillation  REVIEW OF SYSTEMS:  CONSTITUTIONAL: No fever, fatigue or weakness.  EYES: No blurred or double vision.  EARS, NOSE, AND THROAT: No tinnitus or ear pain.  RESPIRATORY: No cough, shortness of breath, wheezing or hemoptysis.  CARDIOVASCULAR: No chest pain, orthopnea, edema.  GASTROINTESTINAL: No nausea, vomiting, diarrhea or abdominal pain.  GENITOURINARY: No dysuria, hematuria.  ENDOCRINE: No polyuria, nocturia,  HEMATOLOGY: No anemia, easy bruising or bleeding SKIN: No rash or lesion. MUSCULOSKELETAL: No joint pain or arthritis.   NEUROLOGIC: No tingling, numbness, weakness.  PSYCHIATRY: No anxiety or depression.   DRUG ALLERGIES:   Allergies  Allergen Reactions  . Penicillins Hives    Has patient had a PCN reaction causing immediate rash, facial/tongue/throat swelling, SOB or lightheadedness with hypotension: Yes Has patient had a PCN reaction causing severe rash involving mucus membranes or skin necrosis: No Has patient had a PCN reaction that required hospitalization No Has patient had a PCN reaction occurring within the last 10 years: No If all of the above answers are "NO", then may proceed with Cephalosporin use.     VITALS:  Blood pressure 113/66, pulse 92, temperature 98.8 F (37.1 C), temperature source Oral, resp. rate 18, height 6\' 1"  (1.854 m), weight 87.5 kg (193 lb), SpO2 100 %.  PHYSICAL EXAMINATION:  GENERAL:  35 y.o.-year-old patient lying in the bed with no acute distress.  EYES: Pupils equal, round, reactive to light and accommodation. No scleral icterus. Extraocular muscles intact.  HEENT: Head atraumatic, normocephalic. Oropharynx  and nasopharynx clear.  NECK:  Supple, no jugular venous distention. No thyroid enlargement, no tenderness.  LUNGS: Normal breath sounds bilaterally, no wheezing, rales,rhonchi or crepitation. No use of accessory muscles of respiration.  CARDIOVASCULAR: Irregularly irregular. No murmurs, rubs, or gallops.  ABDOMEN: Soft, Right lower quadrant abdominal tenderness, no rebound tenderness nondistended. Bowel sounds present. No organomegaly or mass.  EXTREMITIES: No pedal edema, cyanosis, or clubbing.  NEUROLOGIC: Cranial nerves II through XII are intact. Muscle strength 5/5 in all extremities. Sensation intact. Gait not checked.  PSYCHIATRIC: The patient is alert and oriented x 3.  SKIN: No obvious rash, lesion, or ulcer.    LABORATORY PANEL:   CBC  Recent Labs Lab 07/18/16 2331  WBC 16.4*  HGB 16.6  HCT 50.2  PLT 227   ------------------------------------------------------------------------------------------------------------------  Chemistries   Recent Labs Lab 07/18/16 2331  NA 141  K 4.4  CL 101  CO2 31  GLUCOSE 103*  BUN 18  CREATININE 1.34*  CALCIUM 9.6  AST 26  ALT 28  ALKPHOS 71  BILITOT 0.8   ------------------------------------------------------------------------------------------------------------------  Cardiac Enzymes  Recent Labs Lab 07/19/16 0951  TROPONINI <0.03   ------------------------------------------------------------------------------------------------------------------  RADIOLOGY:  Ct Abdomen Pelvis W Contrast  Result Date: 07/19/2016 CLINICAL DATA:  Mid upper abdominal pain with nausea EXAM: CT ABDOMEN AND PELVIS WITH CONTRAST TECHNIQUE: Multidetector CT imaging of the abdomen and pelvis was performed using the standard protocol following bolus administration of intravenous contrast. CONTRAST:  ISOVUE-300 IOPAMIDOL (ISOVUE-300) INJECTION 61% COMPARISON:  05/27/2016, 11/20/2015 FINDINGS: Lower chest: Mild dependent atelectasis. No  acute infiltrate or consolidation. Hepatobiliary: A 5 mm hypodense lesion with in the lateral segment of the  left hepatic lobe is unchanged. There are no additional focal hepatic abnormalities. Gallbladder is unremarkable. No biliary dilatation. Pancreas: Unremarkable. No pancreatic ductal dilatation or surrounding inflammatory changes. Spleen: Normal in size without focal abnormality. Adrenals/Urinary Tract: Adrenal glands are within normal limits. Tiny cortical hypodensities in the right kidney too small to further characterize. A 1.1 cm hypodense lesion in the upper pole is unchanged. Bladder is unremarkable. Stomach/Bowel: The stomach is within normal limits. No dilated small bowel to suggest an obstruction. The appendix is identified and appears enlarged compared the prior exam, appendix diameter measures up to 11 mm. Tiny gas present within the tip of the appendix. Mild amount of high density material at the appendiceal tip could relate to small stone. There is additional focal density within the proximal lumen of the appendix measuring 8 mm which could represent an appendicolith. There is not much inflammation surrounding the appendix. Of note, the appendix size was noted to be larger than 6 mm on the previous exams. Vascular/Lymphatic: No significant vascular findings are present. Mildly prominent right lower quadrant lymph nodes. Reproductive: Prostate is unremarkable. Other: No free air or free fluid Musculoskeletal: Small sclerotic foci in the pelvis unchanged, likely bone islands. IMPRESSION: 1. The appendix appears enlarged compared to the previous exams, measuring up to 11 mm, probable appendicoliths within the tip of the appendix and the proximal lumen ; although there is not much substantial inflammation around the appendix, given the interval change in appearance, appendicitis is a concern. No perforation or abscess. 2. Mild right lower quadrant mesenteric adenopathy, likely reactive. 3. Stable 5 mm  hypodense lesion in the left lobe of the liver. 4. Stable hypodense lesion within the right kidney. Electronically Signed   By: Jasmine PangKim  Fujinaga M.D.   On: 07/19/2016 02:46    EKG:   Orders placed or performed during the hospital encounter of 07/18/16  . ED EKG  . ED EKG  . EKG 12-Lead  . EKG 12-Lead  . EKG 12-Lead  . EKG 12-Lead    ASSESSMENT AND PLAN:   35 year old male patient with no significant past medical history who abuses cocaine presented to the emergency room with abdominal pain and nausea. CT abdomen showed appendix enlargement with stones. EKG in the emergency room showed atrial fibrillation. Hospitalist service was consulted by surgical attending for atrial fibrillation.   1. New onset atrial fibrillation which could be secondary to cocaine abuse from stimulation/acute abd pain Currently rate controlled We will provide Cardizem IV boluses as needed Echocardiogram is ordered, result is pending Patient's chads score 0 Will defer anticoagulation Will defer aspirin as patient is going to surgery Acute MI is ruled out with negative troponins   2. Acute Appendicitis with appendix dilatation and enlargement Nothing by mouth Pain management by surgery Considering surgery today if echocardiogram results are normal. Discussed with Dr.Piscoya Continue IV ciprofloxacin and IV Flagyl antibiotics   3. Abdominal pain and nausea secondary to appendix distention and inflammation Secondary to problem #2  4. Cocaine abuse Acute MI ruled out   DVT prophylaxis with sequential compression devices to lower extremities Supportive care     All the records are reviewed and case discussed with Care Management/Social Workerr. Management plans discussed with the patient, family and they are in agreement.  CODE STATUS: fc   TOTAL TIME TAKING CARE OF THIS PATIENT: 35  minutes.     Note: This dictation was prepared with Dragon dictation along with smaller phrase technology. Any  transcriptional errors that result from  this process are unintentional.   Ramonita Lab M.D on 07/19/2016 at 12:58 PM  Between 7am to 6pm - Pager - [phone] After 6pm go to [email or web address] - password [email or web address] [facility]  [facility]  Office  [phone]  CC: Primary care physician; No PCP Per Patient

## 2016-07-19 NOTE — Interval H&P Note (Signed)
History and Physical Interval Note:  07/19/2016 11:59 AM  Alex NeerKenneth G Vandenbosch Jr.  has presented today for surgery, with the diagnosis of N/A  The various methods of treatment have been discussed with the patient and family. After consideration of risks, benefits and other options for treatment, the patient has consented to  Procedure(s): APPENDECTOMY LAPAROSCOPIC (N/A) as a surgical intervention .  The patient's history has been reviewed, patient examined, no change in status, stable for surgery.  I have reviewed the patient's chart and labs.  Questions were answered to the patient's satisfaction.     Miriam Liles

## 2016-07-19 NOTE — Consult Note (Signed)
[facility]  Medical Consultation  Alex Wilkins. ZOX:[id number] DOB: April 12, 1981 DOA: 07/18/2016 PCP: No PCP Per Patient   Requesting physician: Tonita Cong MD Date of consultation: 07/19/2016 Reason for consultation: Atrial fibrillation  CHIEF COMPLAINT:   Chief Complaint  Patient presents with  . Abdominal Pain    HISTORY OF PRESENT ILLNESS: Alex Wilkins  is a 35 y.o. male with a known history of Cocaine abuse presented to the emergency room with abdominal pain since 5 PM yesterday evening. Abdominal pain is located below the umbilicus and is sharp in nature. The abdominal pain is 10 out of 10 on a scale of 1-10. Patient has some nausea but no evidence of vomiting. No history of any fever or chills. Last meal was yesterday at 5 PM. He had a bowel movement yesterday morning. Patient was evaluated by surgical attending Dr. Tonita Cong in the emergency room after patient was found to have appendix distention of 10 mm with appendicitis. Patient has elevated WBC count. Hospitalist service was consulted for evaluation of atrial fibrillation. Patient doesn't have any history of atrial fibrillation, was found to be in atrial fibrillation in the emergency room. Patient last used cocaine 1 week ago. No complaints of any chest pain or palpitations. No complaints of any shortness of breath, orthopnea and paroxysmal nocturnal dyspnea. No history of any valvular disease.  PAST MEDICAL HISTORY:  History reviewed. No pertinent past medical history.  None  PAST SURGICAL HISTORY: History reviewed. No pertinent surgical history.  None  SOCIAL HISTORY:  Social History  Substance Use Topics  . Smoking status: Current Some Day Smoker  . Smokeless tobacco: Never Used  . Alcohol use Yes  Abuses cocaine  FAMILY HISTORY: No family history on file.  Mother : Has lupus Sister : Has Pacemaker  DRUG ALLERGIES:  Allergies  Allergen Reactions  . Penicillins Hives    REVIEW OF SYSTEMS:    CONSTITUTIONAL: No fever, fatigue or weakness.  EYES: No blurred or double vision.  EARS, NOSE, AND THROAT: No tinnitus or ear pain.  RESPIRATORY: No cough, shortness of breath, wheezing or hemoptysis.  CARDIOVASCULAR: No chest pain, orthopnea, edema.  GASTROINTESTINAL: Has nausea, no vomiting, diarrhea  Has abdominal pain.  GENITOURINARY: No dysuria, hematuria.  ENDOCRINE: No polyuria, nocturia,  HEMATOLOGY: No anemia, easy bruising or bleeding SKIN: No rash or lesion. MUSCULOSKELETAL: No joint pain or arthritis.   NEUROLOGIC: No tingling, numbness, weakness.  PSYCHIATRY: No anxiety or depression.   MEDICATIONS AT HOME:  Prior to Admission medications   Medication Sig Start Date End Date Taking? Authorizing Provider  azithromycin (ZITHROMAX) 250 MG tablet Take 250 mg by mouth daily.    Historical Provider, MD  chlorpheniramine (CHLOR-TRIMETON) 4 MG tablet Take 1 tablet (4 mg total) by mouth 2 (two) times daily as needed for allergies or rhinitis. 11/20/15   Charmayne Sheer Beers, PA-C  ciprofloxacin (CIPRO) 500 MG tablet Take 1 tablet (500 mg total) by mouth 2 (two) times daily. 03/03/16   Jami L Hagler, PA-C  clindamycin (CLEOCIN) 300 MG capsule Take 1 capsule (300 mg total) by mouth 3 (three) times daily. 03/03/16   Jami L Hagler, PA-C  docusate sodium (COLACE) 100 MG capsule Take 1 tablet once or twice daily as needed for constipation while taking narcotic pain medicine 05/27/16   Loleta Rose, MD  guaiFENesin-codeine 100-10 MG/5ML syrup Take 10 mLs by mouth every 4 (four) hours as needed for cough. 11/20/15   Charmayne Sheer Beers, PA-C  ibuprofen (ADVIL,MOTRIN) 800 MG tablet  Take 1 tablet (800 mg total) by mouth every 8 (eight) hours as needed. 11/20/15   Charmayne Sheerharles M Beers, PA-C  levofloxacin (LEVAQUIN) 500 MG tablet Take 1 tablet (500 mg total) by mouth daily. 11/20/15   Evangeline Dakinharles M Beers, PA-C  ondansetron (ZOFRAN ODT) 4 MG disintegrating tablet Allow 1-2 tablets to dissolve in your mouth every 8 hours  as needed for nausea/vomiting 05/27/16   Loleta Roseory Forbach, MD  oxyCODONE-acetaminophen (ROXICET) 5-325 MG tablet Take 1-2 tablets by mouth every 4 (four) hours as needed for severe pain. 05/27/16   Loleta Roseory Forbach, MD  promethazine (PHENERGAN) 25 MG tablet Take 1 tablet (25 mg total) by mouth every 6 (six) hours as needed for nausea or vomiting. 11/20/15   Minna AntisKevin Paduchowski, MD      PHYSICAL EXAMINATION:   VITAL SIGNS: Blood pressure 107/74, pulse 96, temperature 97.7 F (36.5 C), temperature source Oral, resp. rate 20, height 6\' 1"  (1.854 m), weight 90.7 kg (200 lb), SpO2 98 %.  GENERAL:  35 y.o.-year-old patient lying in the bed with no acute distress.  EYES: Pupils equal, round, reactive to light and accommodation. No scleral icterus. Extraocular muscles intact.  HEENT: Head atraumatic, normocephalic. Oropharynx dry and nasopharynx clear.  NECK:  Supple, no jugular venous distention. No thyroid enlargement, no tenderness.  LUNGS: Normal breath sounds bilaterally, no wheezing, rales,rhonchi or crepitation. No use of accessory muscles of respiration.  CARDIOVASCULAR: S1, S2 Irregular. No murmurs, rubs, or gallops.  ABDOMEN: Soft, tenderness below umbilicus, nondistended. Bowel sounds decreased. No organomegaly or mass.  EXTREMITIES: No pedal edema, cyanosis, or clubbing.  NEUROLOGIC: Cranial nerves II through XII are intact. Muscle strength 5/5 in all extremities. Sensation intact. Gait not checked.  PSYCHIATRIC: The patient is alert and oriented x 3.  SKIN: No obvious rash, lesion, or ulcer.   LABORATORY PANEL:   CBC  Recent Labs Lab 07/18/16 2331  WBC 16.4*  HGB 16.6  HCT 50.2  PLT 227  MCV 93.8  MCH 30.9  MCHC 33.0  RDW 12.3   ------------------------------------------------------------------------------------------------------------------  Chemistries   Recent Labs Lab 07/18/16 2331  NA 141  K 4.4  CL 101  CO2 31  GLUCOSE 103*  BUN 18  CREATININE 1.34*  CALCIUM 9.6  AST  26  ALT 28  ALKPHOS 71  BILITOT 0.8   ------------------------------------------------------------------------------------------------------------------ estimated creatinine clearance is 87.8 mL/min (by C-G formula based on SCr of 1.34 mg/dL (H)). ------------------------------------------------------------------------------------------------------------------ No results for input(s): TSH, T4TOTAL, T3FREE, THYROIDAB in the last 72 hours.  Invalid input(s): FREET3   Coagulation profile No results for input(s): INR, PROTIME in the last 168 hours. ------------------------------------------------------------------------------------------------------------------- No results for input(s): DDIMER in the last 72 hours. -------------------------------------------------------------------------------------------------------------------  Cardiac Enzymes  Recent Labs Lab 07/18/16 2331  TROPONINI <0.03   ------------------------------------------------------------------------------------------------------------------ Invalid input(s): POCBNP  ---------------------------------------------------------------------------------------------------------------  Urinalysis    Component Value Date/Time   COLORURINE YELLOW (A) 07/19/2016 0309   APPEARANCEUR CLEAR (A) 07/19/2016 0309   APPEARANCEUR Clear 02/04/2013 0012   LABSPEC 1.043 (H) 07/19/2016 0309   LABSPEC 1.018 02/04/2013 0012   PHURINE 5.0 07/19/2016 0309   GLUCOSEU NEGATIVE 07/19/2016 0309   GLUCOSEU Negative 02/04/2013 0012   HGBUR NEGATIVE 07/19/2016 0309   BILIRUBINUR NEGATIVE 07/19/2016 0309   BILIRUBINUR Negative 02/04/2013 0012   KETONESUR 2+ (A) 07/19/2016 0309   PROTEINUR NEGATIVE 07/19/2016 0309   UROBILINOGEN 0.2 09/03/2010 1532   NITRITE NEGATIVE 07/19/2016 0309   LEUKOCYTESUR NEGATIVE 07/19/2016 0309   LEUKOCYTESUR Negative 02/04/2013 0012     RADIOLOGY: Ct  Abdomen Pelvis W Contrast  Result Date:  07/19/2016 CLINICAL DATA:  Mid upper abdominal pain with nausea EXAM: CT ABDOMEN AND PELVIS WITH CONTRAST TECHNIQUE: Multidetector CT imaging of the abdomen and pelvis was performed using the standard protocol following bolus administration of intravenous contrast. CONTRAST:  ISOVUE-300 IOPAMIDOL (ISOVUE-300) INJECTION 61% COMPARISON:  05/27/2016, 11/20/2015 FINDINGS: Lower chest: Mild dependent atelectasis. No acute infiltrate or consolidation. Hepatobiliary: A 5 mm hypodense lesion with in the lateral segment of the left hepatic lobe is unchanged. There are no additional focal hepatic abnormalities. Gallbladder is unremarkable. No biliary dilatation. Pancreas: Unremarkable. No pancreatic ductal dilatation or surrounding inflammatory changes. Spleen: Normal in size without focal abnormality. Adrenals/Urinary Tract: Adrenal glands are within normal limits. Tiny cortical hypodensities in the right kidney too small to further characterize. A 1.1 cm hypodense lesion in the upper pole is unchanged. Bladder is unremarkable. Stomach/Bowel: The stomach is within normal limits. No dilated small bowel to suggest an obstruction. The appendix is identified and appears enlarged compared the prior exam, appendix diameter measures up to 11 mm. Tiny gas present within the tip of the appendix. Mild amount of high density material at the appendiceal tip could relate to small stone. There is additional focal density within the proximal lumen of the appendix measuring 8 mm which could represent an appendicolith. There is not much inflammation surrounding the appendix. Of note, the appendix size was noted to be larger than 6 mm on the previous exams. Vascular/Lymphatic: No significant vascular findings are present. Mildly prominent right lower quadrant lymph nodes. Reproductive: Prostate is unremarkable. Other: No free air or free fluid Musculoskeletal: Small sclerotic foci in the pelvis unchanged, likely bone islands.  IMPRESSION: 1. The appendix appears enlarged compared to the previous exams, measuring up to 11 mm, probable appendicoliths within the tip of the appendix and the proximal lumen ; although there is not much substantial inflammation around the appendix, given the interval change in appearance, appendicitis is a concern. No perforation or abscess. 2. Mild right lower quadrant mesenteric adenopathy, likely reactive. 3. Stable 5 mm hypodense lesion in the left lobe of the liver. 4. Stable hypodense lesion within the right kidney. Electronically Signed   By: Jasmine Pang M.D.   On: 07/19/2016 02:46    EKG: Orders placed or performed during the hospital encounter of 07/18/16  . ED EKG  . ED EKG    IMPRESSION AND PLAN: 35 year old male patient with no significant past medical history who abuses cocaine presented to the emergency room with abdominal pain and nausea. CT abdomen showed appendix enlargement with stones. EKG in the emergency room showed atrial fibrillation. Hospitalist service was consulted by surgical attending for atrial fibrillation. Assessment : 1. New onset atrial fibrillation which could be secondary to cocaine abuse from stimulation 2. Appendicitis with appendix dilatation and enlargement 3. Abdominal pain and nausea secondary to appendix distention and inflammation 4. Cocaine abuse Treatment plan : Patient's chads score 0 Will defer anticoagulation Will defer aspirin as patient is going to surgery IV Cardizem push 10 mg as needed for rate control Currently heart rate less than 100/m Check echocardiogram to assess any valvular abnormality Cycle troponin to rule out ischemia Continue IV ciprofloxacin and IV Flagyl antibiotics DVT prophylaxis with sequential compression devices to lower extremities Supportive care Thank you for consultation with follow-up patient.  All the records are reviewed and case discussed with ED provider. Management plans discussed with the patient,  family and they are in agreement.  CODE STATUS:FULL  Code Status History    This patient does not have a recorded code status. Please follow your organizational policy for patients in this situation.       TOTAL TIME TAKING CARE OF THIS PATIENT: 52 minutes.    Ihor Austin M.D on 07/19/2016 at 3:59 AM  Between 7am to 6pm - Pager - [phone]  After 6pm go to [email or web address] - password [email or web address] [facility]  [facility]  Office  [phone]  CC: Primary care physician; No PCP Per Patient

## 2016-07-19 NOTE — ED Notes (Signed)
MD at bedside. 

## 2016-07-19 NOTE — Progress Notes (Signed)
Upon arrival in the PACU pt noted to be cyanotic and not breathing well. Mark Noles CRNA did head tilt and chin lift and placed a larger oral airway in. Pt O2 sats steadily came back up and pt was given Narcan also. Dr. Karlton LemonKarenz and Dr. Aleen CampiPiscoya at bedside. Willona Phariss E 4:48 PM 07/19/2016

## 2016-07-19 NOTE — Anesthesia Procedure Notes (Signed)
Procedure Name: Intubation Date/Time: 07/19/2016 2:50 PM Performed by: Junious SilkNOLES, Fabio Wah Pre-anesthesia Checklist: Patient identified, Patient being monitored, Timeout performed, Emergency Drugs available and Suction available Patient Re-evaluated:Patient Re-evaluated prior to inductionOxygen Delivery Method: Circle System Utilized Preoxygenation: Pre-oxygenation with 100% oxygen Intubation Type: IV induction Ventilation: Mask ventilation without difficulty Laryngoscope Size: Mac and 3 Grade View: Grade II Tube type: Oral Tube size: 7.5 mm Number of attempts: 1 Airway Equipment and Method: Stylet Placement Confirmation: ETT inserted through vocal cords under direct vision,  positive ETCO2 and breath sounds checked- equal and bilateral Secured at: 21 cm Tube secured with: Tape Dental Injury: Teeth and Oropharynx as per pre-operative assessment

## 2016-07-19 NOTE — ED Notes (Signed)
HR spiked up 102-158 in Afib MD aware going to see pt.

## 2016-07-19 NOTE — Progress Notes (Signed)
Notified surgeon of patient elevated HR up to 135 and Afib. Noted patient received Cardizem IV push in the ER.  PRN Cardizem given. Continue to assess.

## 2016-07-19 NOTE — Op Note (Signed)
  Procedure Date:  07/19/2016  Pre-operative Diagnosis:  Acute appendicitis  Post-operative Diagnosis: Acute appendicitis  Procedure:  Laparoscopic appendectomy  Surgeon:  Howie IllJose Luis Benjamen Koelling, MD  Anesthesia:  General endotracheal  Estimated Blood Loss:  5 ml  Specimens:  appendix  Complications:  None  Indications for Procedure:  This is a 35 y.o. male who presents with abdominal pain and workup revealing acute appendicitis.  The options of surgery versus observation were reviewed with the patient and/or family. The risks of bleeding, infection, recurrence of symptoms, negative laparoscopy, potential for an open procedure, bowel injury, abscess or infection, were all discussed with the patient and he was willing to proceed.  Description of Procedure: The patient was correctly identified in the preoperative area and brought into the operating room.  The patient was placed supine with VTE prophylaxis in place.  Appropriate time-outs were performed.  Anesthesia was induced and the patient was intubated.  Foley catheter was placed.  Appropriate antibiotics were infused.  The abdomen was prepped and draped in a sterile fashion. An infraumbilical incision was made. A cutdown technique was used to enter the abdominal cavity without injury, and a Hasson trocar was inserted.  Pneumoperitoneum was obtained with appropriate opening pressures.  Two 5-mm ports were placed in the suprapubic and left lateral positions under direct visualization.  The right lower quadrant was inspected and the appendix was identified going towards the pelvis and found to be acutely inflamed but not ruptured.  The appendix was carefully dissected. The mesoappendix was divided using the LigaSure.  The base of the appendix was dissected out and divided with a standard load Endo GIA.  The appendix was placed in an Endocatch bag and brought out through the umbilical incision.  The right lower quadrant was then inspected again  revealing an intact staple line, no bleeding, and no bowel injury.  The pelvis and right lower quadrant were thoroughly irrigated.  The 5 mm ports were removed under direct visualization and the Hasson trocar was removed.  The fascial opening was closed using 0 vicryl suture.  Local anesthetic was infused in all incisions and the incisions were closed with 4-0 Monocryl.  The wounds were cleaned and sealed with DermaBond.  Foley catheter was removed and the patient was emerged from anesthesia and extubated and brought to the recovery room for further management.  The patient tolerated the procedure well and all counts were correct at the end of the case.   Howie IllJose Luis Merrisa Skorupski, MD

## 2016-07-19 NOTE — Transfer of Care (Signed)
Immediate Anesthesia Transfer of Care Note  Patient: Alex NeerKenneth G Teale Jr.  Procedure(s) Performed: Procedure(s): APPENDECTOMY LAPAROSCOPIC (N/A)  Patient Location: PACU  Anesthesia Type:General  Level of Consciousness: sedated and unresponsive  Airway & Oxygen Therapy: Patient Spontanous Breathing and Patient connected to face mask oxygen  Post-op Assessment: Report given to RN and Post -op Vital signs reviewed and stable  Post vital signs: Reviewed and stable  Last Vitals:  Vitals:   07/19/16 1424 07/19/16 1611  BP: 133/78   Pulse: 93   Resp: 16   Temp: 37.3 C (P) 36.5 C    Last Pain:  Vitals:   07/19/16 1424  TempSrc: Tympanic  PainSc: 6       Patients Stated Pain Goal: 3 (07/19/16 1013)  Complications: No apparent anesthesia complications

## 2016-07-19 NOTE — ED Notes (Signed)
Patient transported to CT 

## 2016-07-19 NOTE — Anesthesia Preprocedure Evaluation (Signed)
Anesthesia Evaluation  Patient identified by MRN, date of birth, ID band Patient awake    Reviewed: Allergy & Precautions, NPO status , Patient's Chart, lab work & pertinent test results  Airway Mallampati: II       Dental   Pulmonary Current Smoker,    Pulmonary exam normal        Cardiovascular Normal cardiovascular exam+ dysrhythmias      Neuro/Psych negative neurological ROS  negative psych ROS   GI/Hepatic Neg liver ROS, Acute appendix   Endo/Other  negative endocrine ROS  Renal/GU negative Renal ROS  negative genitourinary   Musculoskeletal negative musculoskeletal ROS (+)   Abdominal Normal abdominal exam  (+)   Peds negative pediatric ROS (+)  Hematology negative hematology ROS (+)   Anesthesia Other Findings Echo results are negative according to Dr. Juliann Paresallwood  Reproductive/Obstetrics                            Anesthesia Physical Anesthesia Plan  ASA: II  Anesthesia Plan: General   Post-op Pain Management:    Induction: Intravenous  Airway Management Planned: Oral ETT  Additional Equipment:   Intra-op Plan:   Post-operative Plan: Extubation in OR  Informed Consent: I have reviewed the patients History and Physical, chart, labs and discussed the procedure including the risks, benefits and alternatives for the proposed anesthesia with the patient or authorized representative who has indicated his/her understanding and acceptance.   Dental advisory given  Plan Discussed with: CRNA and Surgeon  Anesthesia Plan Comments:         Anesthesia Quick Evaluation

## 2016-07-19 NOTE — Anesthesia Postprocedure Evaluation (Signed)
Anesthesia Post Note  Patient: Hal NeerKenneth G Goold Jr.  Procedure(s) Performed: Procedure(s) (LRB): APPENDECTOMY LAPAROSCOPIC (N/A)  Patient location during evaluation: PACU Anesthesia Type: General Level of consciousness: awake and alert Pain management: pain level controlled Vital Signs Assessment: post-procedure vital signs reviewed and stable Respiratory status: spontaneous breathing, nonlabored ventilation, respiratory function stable and patient connected to nasal cannula oxygen Cardiovascular status: blood pressure returned to baseline and stable Postop Assessment: no signs of nausea or vomiting Anesthetic complications: no    Last Vitals:  Vitals:   07/19/16 1949 07/19/16 2002  BP: 101/67 (!) 97/57  Pulse: (!) 101 89  Resp: 17   Temp: 36.8 C     Last Pain:  Vitals:   07/19/16 1949  TempSrc: Oral  PainSc:                  Lenard SimmerAndrew Wilmer Santillo

## 2016-07-20 ENCOUNTER — Encounter: Payer: Self-pay | Admitting: Surgery

## 2016-07-20 LAB — CBC
HEMATOCRIT: 38.2 % — AB (ref 40.0–52.0)
Hemoglobin: 13.1 g/dL (ref 13.0–18.0)
MCH: 31.7 pg (ref 26.0–34.0)
MCHC: 34.2 g/dL (ref 32.0–36.0)
MCV: 92.6 fL (ref 80.0–100.0)
Platelets: 188 10*3/uL (ref 150–440)
RBC: 4.12 MIL/uL — ABNORMAL LOW (ref 4.40–5.90)
RDW: 12 % (ref 11.5–14.5)
WBC: 13.8 10*3/uL — AB (ref 3.8–10.6)

## 2016-07-20 MED ORDER — CIPROFLOXACIN HCL 500 MG PO TABS
500.0000 mg | ORAL_TABLET | Freq: Two times a day (BID) | ORAL | Status: DC
  Administered 2016-07-20: 500 mg via ORAL
  Filled 2016-07-20: qty 1

## 2016-07-20 MED ORDER — IBUPROFEN 600 MG PO TABS: 600.0000 mg | ORAL_TABLET | Freq: Three times a day (TID) | ORAL | 0 refills | Status: DC | PRN

## 2016-07-20 MED ORDER — HYDROCODONE-ACETAMINOPHEN 5-325 MG PO TABS
1.0000 | ORAL_TABLET | Freq: Four times a day (QID) | ORAL | Status: DC | PRN
  Administered 2016-07-20: 1 via ORAL
  Filled 2016-07-20: qty 1

## 2016-07-20 MED ORDER — METRONIDAZOLE 500 MG PO TABS
500.0000 mg | ORAL_TABLET | Freq: Three times a day (TID) | ORAL | Status: DC
  Administered 2016-07-20: 500 mg via ORAL
  Filled 2016-07-20: qty 1

## 2016-07-20 MED ORDER — OXYCODONE HCL 5 MG PO TABS
5.0000 mg | ORAL_TABLET | ORAL | Status: DC | PRN
  Administered 2016-07-20: 10 mg via ORAL
  Administered 2016-07-20: 5 mg via ORAL
  Filled 2016-07-20: qty 2
  Filled 2016-07-20 (×2): qty 1

## 2016-07-20 MED ORDER — FAMOTIDINE 20 MG PO TABS: 20.0000 mg | ORAL_TABLET | Freq: Two times a day (BID) | ORAL | Status: DC

## 2016-07-20 MED ORDER — OXYCODONE HCL 5 MG PO TABS: 5.0000 mg | ORAL_TABLET | ORAL | 0 refills | Status: DC | PRN

## 2016-07-20 NOTE — Progress Notes (Signed)
07/20/2016  Subjective: Patient is 1 Day Post-Op status post laparoscopic appendectomy. No acute events overnight. The oxycodone was switched to Vicodin last night as the patient reported that it was too strong cause nausea. However Vicodin has not been is helpful with pain control. Patient reports that the oxycodone given nausea because he had an empty stomach but felt better after he ate and would like his medication changed again.  Vital signs: Temp:  [96.3 F (35.7 C)-99.1 F (37.3 C)] 97.7 F (36.5 C) (10/27 0539) Pulse Rate:  [73-111] 73 (10/27 0539) Resp:  [14-20] 16 (10/27 0539) BP: (90-133)/(49-100) 90/49 (10/27 0539) SpO2:  [33 %-100 %] 99 % (10/27 0539) Weight:  [87.5 kg (193 lb)] 87.5 kg (193 lb) (10/26 1424)   Intake/Output: 10/26 0701 - 10/27 0700 In: 4725.2 [P.O.:720; I.V.:3303.7; IV Piggyback:701.5] Out: 880 [Urine:875; Blood:5] Last BM Date: 07/19/16  Physical Exam: Constitutional:  No acute distress Cardiac:  Regular rhythm and rate. Pulm: Respiratory distress Abdomen: Soft, nondistended, appropriately tender to palpation over the incisions which are clean dry and intact.  Labs:   Recent Labs  07/18/16 2331 07/20/16 0454  WBC 16.4* 13.8*  HGB 16.6 13.1  HCT 50.2 38.2*  PLT 227 188    Recent Labs  07/18/16 2331 07/19/16 1807  NA 141 136  K 4.4 3.5  CL 101 100*  CO2 31 29  GLUCOSE 103* 176*  BUN 18 10  CREATININE 1.34* 1.15  CALCIUM 9.6 8.6*   No results for input(s): LABPROT, INR in the last 72 hours.  Imaging: No results found.  Assessment/Plan: 35 year old male status post laparoscopic appendectomy.  -Diet will be advanced to regular today. -IV fluids will be discontinued. -The patient's pain is better controlled and he tolerates his diet he will be discharged to home later today.   Howie IllJose Luis Hadlie Gipson, MD [facility]

## 2016-07-20 NOTE — Discharge Summary (Signed)
Patient ID: Alex NeerKenneth G Ketcherside Jr. MRN: [id number] DOB/AGE: December 01, 1980 35 y.o.  Admit date: 07/18/2016 Discharge date: 07/20/2016   Discharge Diagnoses:  Active Problems:   Acute appendicitis   Atrial fibrillation with rapid ventricular response [facility](HCC)   Procedures: Laparoscopic appendectomy  Hospital Course: Patient was admitted on 10/25 with acute appendicitis. He also had new onset of atrial fibrillation with RVR. Tox screen was negative for cocaine but he did admit to using one week prior. Medical team was consulted and the patient was given 2 doses overall of IV diltiazem. He converted back to sinus rhythm and his tachycardia improved as well. He did have an echocardiogram done on 10/26 which was negative for any valvular disease. He was taken to the operating room on 10/26 and underwent a laparoscopic appendectomy. He tolerated the procedure well. He was started on a clear liquid diet which she tolerated well and this was advanced to a regular diet on postop day 1. He was tolerating a diet, had his pain well controlled, and was deemed ready for discharge to home.  Consults: Internal Medicine  Disposition: 01-Home or Self Care  Discharge Instructions    Call MD for:  difficulty breathing, headache or visual disturbances    Complete by:  As directed    Call MD for:  persistant nausea and vomiting    Complete by:  As directed    Call MD for:  redness, tenderness, or signs of infection (pain, swelling, redness, odor or green/yellow discharge around incision site)    Complete by:  As directed    Call MD for:  severe uncontrolled pain    Complete by:  As directed    Call MD for:  temperature >100.4    Complete by:  As directed    Diet - low sodium heart healthy    Complete by:  As directed    Discharge instructions    Complete by:  As directed    Patient may shower but do not scrub wounds heavily and dab dry only. Do not apply any ointments or hydrogen peroxide to the wounds.   Driving Restrictions    Complete by:  As directed    Do not drive while taking narcotics for pain control.   Increase activity slowly    Complete by:  As directed    Lifting restrictions    Complete by:  As directed    No heavy lifting of more than 10 pounds for 4 weeks.   No dressing needed    Complete by:  As directed        Medication List    STOP taking these medications   oxyCODONE-acetaminophen 5-325 MG tablet Commonly known as:  ROXICET     TAKE these medications   ibuprofen 600 MG tablet Commonly known as:  ADVIL,MOTRIN Take 1 tablet (600 mg total) by mouth every 8 (eight) hours as needed for fever or mild pain.   oxyCODONE 5 MG immediate release tablet Commonly known as:  Oxy IR/ROXICODONE Take 1-2 tablets (5-10 mg total) by mouth every 4 (four) hours as needed for moderate pain or severe pain.      Follow-up Information    Henrene DodgeJose Christophr Calix, MD Follow up in 2 week(s).   Specialty:  Surgery Contact information: [address]  [address] [place] [id number] [phone]

## 2016-07-20 NOTE — Progress Notes (Signed)
Pt A and O x 4. VSS. Pt tolerating diet well. Complaints of pain with oral pain medications given. Pt removed his own IV, prescriptions given. Pt voiced understanding of discharge instructions with no further questions. Pt discharged via walking down with nurse.

## 2016-07-21 ENCOUNTER — Encounter: Payer: Self-pay | Admitting: Emergency Medicine

## 2016-07-21 ENCOUNTER — Emergency Department
Admission: EM | Admit: 2016-07-21 | Discharge: 2016-07-21 | Disposition: A | Discharge status: Home / Self Care | Payer: Self-pay | Attending: Emergency Medicine | Admitting: Emergency Medicine

## 2016-07-21 DIAGNOSIS — G8918 Other acute postprocedural pain: Secondary | ICD-10-CM | POA: Insufficient documentation

## 2016-07-21 DIAGNOSIS — F17200 Nicotine dependence, unspecified, uncomplicated: Secondary | ICD-10-CM | POA: Insufficient documentation

## 2016-07-21 DIAGNOSIS — R1030 Lower abdominal pain, unspecified: Secondary | ICD-10-CM | POA: Insufficient documentation

## 2016-07-21 LAB — BASIC METABOLIC PANEL
ANION GAP: 6 (ref 5–15)
BUN: 13 mg/dL (ref 6–20)
CHLORIDE: 105 mmol/L (ref 101–111)
CO2: 29 mmol/L (ref 22–32)
Calcium: 8.9 mg/dL (ref 8.9–10.3)
Creatinine, Ser: 1.08 mg/dL (ref 0.61–1.24)
GFR calc non Af Amer: >60 mL/min (ref >60)
Glucose, Bld: 99 mg/dL (ref 65–99)
POTASSIUM: 3.5 mmol/L (ref 3.5–5.1)
SODIUM: 140 mmol/L (ref 135–145)

## 2016-07-21 LAB — CBC
HCT: 40.3 % (ref 40.0–52.0)
HEMOGLOBIN: 13.7 g/dL (ref 13.0–18.0)
MCH: 31.7 pg (ref 26.0–34.0)
MCHC: 34.1 g/dL (ref 32.0–36.0)
MCV: 92.7 fL (ref 80.0–100.0)
Platelets: 209 10*3/uL (ref 150–440)
RBC: 4.34 MIL/uL — AB (ref 4.40–5.90)
RDW: 12.2 % (ref 11.5–14.5)
WBC: 7 10*3/uL (ref 3.8–10.6)

## 2016-07-21 LAB — LACTIC ACID, PLASMA: LACTIC ACID, VENOUS: 0.9 mmol/L (ref 0.5–1.9)

## 2016-07-21 MED ORDER — BUPIVACAINE HCL (PF) 0.5 % IJ SOLN
20.0000 mL | Freq: Once | INTRAMUSCULAR | Status: AC
  Administered 2016-07-21: 20 mL

## 2016-07-21 MED ORDER — IBUPROFEN 600 MG PO TABS: 600.0000 mg | ORAL_TABLET | Freq: Four times a day (QID) | ORAL | 0 refills | Status: DC | PRN

## 2016-07-21 MED ORDER — BUPIVACAINE HCL (PF) 0.5 % IJ SOLN
INTRAMUSCULAR | Status: AC
  Filled 2016-07-21: qty 30

## 2016-07-21 MED ORDER — KETOROLAC TROMETHAMINE 30 MG/ML IJ SOLN
30.0000 mg | Freq: Once | INTRAMUSCULAR | Status: AC
  Administered 2016-07-21: 30 mg via INTRAVENOUS
  Filled 2016-07-21: qty 1

## 2016-07-21 MED ORDER — MORPHINE SULFATE (PF) 2 MG/ML IV SOLN
4.0000 mg | Freq: Once | INTRAVENOUS | Status: DC
Start: 2016-07-21 — End: 2016-07-21

## 2016-07-21 MED ORDER — HYDROMORPHONE HCL 2 MG PO TABS: 2.0000 mg | ORAL_TABLET | Freq: Two times a day (BID) | ORAL | 0 refills | Status: DC | PRN

## 2016-07-21 MED ORDER — ONDANSETRON HCL 4 MG/2ML IJ SOLN
4.0000 mg | Freq: Once | INTRAMUSCULAR | Status: AC
  Administered 2016-07-21: 4 mg via INTRAVENOUS
  Filled 2016-07-21: qty 2

## 2016-07-21 MED ORDER — FENTANYL CITRATE (PF) 100 MCG/2ML IJ SOLN
50.0000 ug | INTRAMUSCULAR | Status: DC | PRN
  Administered 2016-07-21: 50 ug via INTRAVENOUS
  Filled 2016-07-21: qty 2

## 2016-07-21 MED ORDER — HYDROMORPHONE HCL 2 MG PO TABS
2.0000 mg | ORAL_TABLET | Freq: Once | ORAL | Status: AC
  Administered 2016-07-21: 2 mg via ORAL
  Filled 2016-07-21: qty 1

## 2016-07-21 NOTE — Discharge Instructions (Signed)
Please follow-up with general surgery as scheduled. Please take your pain medication only as prescribed. He continued to have abdominal pain please follow-up with your general surgeon.

## 2016-07-21 NOTE — ED Notes (Signed)
Patient states morphine doesn't work for him and even with Phenergan makes him vomit. Patient asked for Demerol. MD aware.

## 2016-07-21 NOTE — Consult Note (Signed)
Date of Consultation:  07/21/2016  Requesting Physician:  Minna AntisKevin Paduchowski  Reason for Consultation:  Abdominal pain  History of Present Illness: Alex NeerKenneth G Ress Jr. is a 35 y.o. male who is postop day #2 status post laparoscopic appendectomy on 10/26. He was discharged to home yesterday with a prescription for oral oxycodone and ibuprofen. Patient reports that he filled the oxycodone prescription a different pharmacy never picked up the ibuprofen prescription. He says he continues to be in significant abdominal pain at the periumbilical incision site and needed to take all 4 of his oxycodone pills at a time to achieve any type of pain control. He presented to the emergency room for pain management. Of note he does have a history of drug abuse and did admit to taking cocaine 1 week prior to his previous hospitalization for the appendicitis. Denies any nausea, vomiting, fevers, chills. He has been having flatus and does not feel bloated.  Past Medical History: History reviewed. No pertinent past medical history.   Past Surgical History: Past Surgical History:  Procedure Laterality Date  . LAPAROSCOPIC APPENDECTOMY N/A 07/19/2016   Procedure: APPENDECTOMY LAPAROSCOPIC;  Surgeon: Henrene DodgeJose Marsella Suman, MD;  Location: [facility] ORS;  Service: General;  Laterality: N/A;    Home Medications: Prior to Admission medications   Medication Sig Start Date End Date Taking? Authorizing Provider  HYDROmorphone (DILAUDID) 2 MG tablet Take 1 tablet (2 mg total) by mouth every 12 (twelve) hours as needed for severe pain. 07/21/16 07/21/17  Minna AntisKevin Paduchowski, MD  ibuprofen (ADVIL,MOTRIN) 600 MG tablet Take 1 tablet (600 mg total) by mouth every 6 (six) hours as needed. 07/21/16   Minna AntisKevin Paduchowski, MD  oxyCODONE (OXY IR/ROXICODONE) 5 MG immediate release tablet Take 1-2 tablets (5-10 mg total) by mouth every 4 (four) hours as needed for moderate pain or severe pain. 07/20/16   Henrene DodgeJose Kinlee Garrison, MD    Allergies: Allergies   Allergen Reactions  . Penicillins Hives    Has patient had a PCN reaction causing immediate rash, facial/tongue/throat swelling, SOB or lightheadedness with hypotension: Yes Has patient had a PCN reaction causing severe rash involving mucus membranes or skin necrosis: No Has patient had a PCN reaction that required hospitalization No Has patient had a PCN reaction occurring within the last 10 years: No If all of the above answers are "NO", then may proceed with Cephalosporin use.     Social History:  reports that he has been smoking.  He has never used smokeless tobacco. He reports that he drinks alcohol. He reports that he uses drugs, including Cocaine.   Family History: History reviewed. No pertinent family history.  Review of Systems: Review of Systems  Constitutional: Negative for chills and fever.  Eyes: Negative for blurred vision.  Respiratory: Negative for cough and shortness of breath.   Cardiovascular: Negative for chest pain and leg swelling.  Gastrointestinal: Positive for abdominal pain. Negative for constipation, diarrhea, heartburn, nausea and vomiting.  Genitourinary: Negative for dysuria and hematuria.  Musculoskeletal: Negative for myalgias.  Skin: Negative for rash.  Neurological: Negative for dizziness and headaches.  Psychiatric/Behavioral: Negative for depression.    Physical Exam BP 107/64 (BP Location: Right Arm)   Pulse 70   Temp 99.1 F (37.3 C) (Oral)   Resp 18   Wt 87.5 kg (193 lb)   SpO2 98%   BMI 25.46 kg/m  CONSTITUTIONAL: Mild pain distress HEENT:  Normocephalic, atraumatic, extraocular motion intact. NECK: Trachea is midline, and there is no jugular venous distension. LYMPH NODES:  Lymph nodes in the neck are not enlarged. RESPIRATORY:  Lungs are clear, and breath sounds are equal bilaterally. Normal respiratory effort without pathologic use of accessory muscles. CARDIOVASCULAR: Heart is regular without murmurs, gallops, or rubs. GI:  The abdomen is soft, nondistended, with tenderness to palpation at the periumbilical incision site. There is mild ecchymosis surrounding all 3 incisions but otherwise there is no evidence of infection. MUSCULOSKELETAL:  Normal muscle strength and tone in all four extremities.  No peripheral edema or cyanosis. SKIN: Skin turgor is normal. There are no pathologic skin lesions.  NEUROLOGIC:  Motor and sensation is grossly normal.  Cranial nerves are grossly intact. PSYCH:  Alert and oriented to person, place and time. Affect is normal.  Laboratory Analysis: Results for orders placed or performed during the hospital encounter of 07/21/16 (from the past 24 hour(s))  CBC     Status: Abnormal   Collection Time: 07/21/16  2:47 PM  Result Value Ref Range   WBC 7.0 3.8 - 10.6 K/uL   RBC 4.34 (L) 4.40 - 5.90 MIL/uL   Hemoglobin 13.7 13.0 - 18.0 g/dL   HCT 81.140.3 91.440.0 - 78.252.0 %   MCV 92.7 80.0 - 100.0 fL   MCH 31.7 26.0 - 34.0 pg   MCHC 34.1 32.0 - 36.0 g/dL   RDW 95.612.2 21.311.5 - 08.614.5 %   Platelets 209 150 - 440 K/uL  Basic metabolic panel     Status: None   Collection Time: 07/21/16  2:47 PM  Result Value Ref Range   Sodium 140 135 - 145 mmol/L   Potassium 3.5 3.5 - 5.1 mmol/L   Chloride 105 101 - 111 mmol/L   CO2 29 22 - 32 mmol/L   Glucose, Bld 99 65 - 99 mg/dL   BUN 13 6 - 20 mg/dL   Creatinine, Ser 5.781.08 0.61 - 1.24 mg/dL   Calcium 8.9 8.9 - 46.910.3 mg/dL   GFR calc non Af Amer >60 >60 mL/min   GFR calc Af Amer >60 >60 mL/min   Anion gap 6 5 - 15  Lactic acid, plasma     Status: None   Collection Time: 07/21/16  2:47 PM  Result Value Ref Range   Lactic Acid, Venous 0.9 0.5 - 1.9 mmol/L    Imaging: No results found.  Assessment and Plan: This is a 35 y.o. male who presents with Abdominal pain at the periumbilical site status post laparoscopic appendectomy, now postop day #2. I personally reviewed all of the patient's laboratory studies. His white blood cell count is within normal and has a  normal lactic acid. There is no need at this point for a repeat CT scan. His pain is at the periumbilical incision where he has this 0 Vicryl to close the fascial incision. Most likely this is the source of his pain. I have discussed with the patient and with Dr. Lenard LancePaduchowski we should try the patient on an oral dose of Dilaudid as well as IV Toradol and if these work, to give new prescriptions for oral Dilaudid and Motrin for him to take home. I have also injected his periumbilical site with 20 mL of 0.5% bupivacaine which resulted in improvement in his pain. The patient should follow up with our office within 2 weeks to evaluate his pain control and healing of his wounds. Patient understands this plan and all of his questions have been answered.    Howie IllJose Luis Ewing Fandino, MD [facility]

## 2016-07-21 NOTE — ED Notes (Signed)
Umbilical incision covered with gauze.

## 2016-07-21 NOTE — ED Notes (Signed)
Patient unable to void at this time

## 2016-07-21 NOTE — ED Triage Notes (Addendum)
Pt to ed with c/o abd pain post op from appendix removal on Thursday.  Pt states he was given pain meds but they are not helping.  Site without redness or swelling.

## 2016-07-21 NOTE — ED Provider Notes (Signed)
[facility] Emergency Department Provider Note  Time seen: 5:14 PM  I have reviewed the triage vital signs and the nursing notes.   HISTORY  Chief Complaint Abdominal Pain    HPI Alex NeerKenneth G Arney Jr. is a 35 y.o. male who presents to the emergency department with lower abdominal pain. Patient is 2 days status post appendectomy. Patient states severe lower abdominal pain, states it is unchanged since the surgery, has not improved or worsened. Denies any fever. Describes abdominal pain as severe located in the periumbilical region and lower abdomen. Denies any dysuria. Denies any fever. States nausea but denies vomiting.  History reviewed. No pertinent past medical history.  Patient Active Problem List   Diagnosis Date Noted  . Acute appendicitis 07/19/2016  . Acute appendicitis with localized peritonitis   . Atrial fibrillation with rapid ventricular response [facility](HCC)     Past Surgical History:  Procedure Laterality Date  . LAPAROSCOPIC APPENDECTOMY N/A 07/19/2016   Procedure: APPENDECTOMY LAPAROSCOPIC;  Surgeon: Henrene DodgeJose Piscoya, MD;  Location: [facility] ORS;  Service: General;  Laterality: N/A;    Prior to Admission medications   Medication Sig Start Date End Date Taking? Authorizing Provider  ibuprofen (ADVIL,MOTRIN) 600 MG tablet Take 1 tablet (600 mg total) by mouth every 8 (eight) hours as needed for fever or mild pain. 07/20/16   Henrene DodgeJose Piscoya, MD  oxyCODONE (OXY IR/ROXICODONE) 5 MG immediate release tablet Take 1-2 tablets (5-10 mg total) by mouth every 4 (four) hours as needed for moderate pain or severe pain. 07/20/16   Henrene DodgeJose Piscoya, MD    Allergies  Allergen Reactions  . Penicillins Hives    Has patient had a PCN reaction causing immediate rash, facial/tongue/throat swelling, SOB or lightheadedness with hypotension: Yes Has patient had a PCN reaction causing severe rash involving mucus membranes or skin necrosis: No Has patient had a PCN reaction that  required hospitalization No Has patient had a PCN reaction occurring within the last 10 years: No If all of the above answers are "NO", then may proceed with Cephalosporin use.     History reviewed. No pertinent family history.  Social History Social History  Substance Use Topics  . Smoking status: Current Some Day Smoker  . Smokeless tobacco: Never Used  . Alcohol use Yes    Review of Systems Constitutional: Negative for fever. Cardiovascular: Negative for chest pain. Respiratory: Negative for shortness of breath. Gastrointestinal: Lower abdominal pain. Negative for vomiting or diarrhea. Neurological: Negative for headache 10-point ROS otherwise negative.  ____________________________________________   PHYSICAL EXAM:  VITAL SIGNS: ED Triage Vitals  Enc Vitals Group     BP 07/21/16 1439 126/83     Pulse Rate 07/21/16 1439 (!) 104     Resp 07/21/16 1439 20     Temp 07/21/16 1439 99.1 F (37.3 C)     Temp Source 07/21/16 1439 Oral     SpO2 07/21/16 1439 97 %     Weight 07/21/16 1435 193 lb (87.5 kg)     Height --      Head Circumference --      Peak Flow --      Pain Score 07/21/16 1435 10     Pain Loc --      Pain Edu? --      Excl. in GC? --     Constitutional: Alert and oriented. Well appearing and in no distress. Eyes: Normal exam ENT   Head: Normocephalic and atraumatic.   Mouth/Throat: Mucous membranes are moist. Cardiovascular:  Normal rate, regular rhythm. No murmur Respiratory: Normal respiratory effort without tachypnea nor retractions. Breath sounds are clear  Gastrointestinal: Soft, moderate lower abdominal tenderness palpation. No rebound or guarding. Well appearing surgical incisions. Musculoskeletal: Nontender with normal range of motion in all extremities.  Neurologic:  Normal speech and language. No gross focal neurologic deficits Skin:  Skin is warm, dry and intact.  Psychiatric: Mood and affect are normal. Speech and behavior are  normal.   ____________________________________________   INITIAL IMPRESSION / ASSESSMENT AND PLAN / ED COURSE  Pertinent labs & imaging results that were available during my care of the patient were reviewed by me and considered in my medical decision making (see chart for details).  The patient presents the emergency department with lower abdominal discomfort 2 days status post appendectomy. Patient's labs are normal including white blood cell count and lactic acid. Patient states severe pain. I discussed the patient Gen. surgery who will be down to see the patient. At this time we'll hold off on additional CT imaging as the patient is 2 days status post appendectomy CT imaging is unlikely to be of much benefit at this time. Of concern the patient does have a significant history of opiate prescriptions filled in the past including 3 narcotic prescriptions filled this month including 30 5 mg immediate release oxycodone's filled yesterday. Patient states he is now out of pain medication.  Dr. Aleen CampiPiscoya will be down to see the patient.  Dr.Piscoya has seen the patient. He is recommending 2 mg of Dilaudid orally along with 30 mg of Toradol IV. He will also perform local bupivacaine injections around the periumbilical incision site where the patient has sutures which appear to be bothering him. We'll discharge the patient home with a short course of oral Dilaudid per Dr.Piscoya's recommendation as the patient is 2 days status post appendectomy. Patient understands that he can only take this medication as prescribed, and then no further narcotics will be given. ____________________________________________   FINAL CLINICAL IMPRESSION(S) / ED DIAGNOSES  Lower abdominal pain.    Minna AntisKevin Karrisa Didio, MD 07/21/16 [phone]

## 2016-07-21 NOTE — ED Triage Notes (Signed)
Pt reports worsening and pain and nausea. 10/10. Nurse to give pt mediation for nausea and pain and place pt in subwait at this time. Father will remain by pt's side.

## 2016-07-23 LAB — SURGICAL PATHOLOGY

## 2016-07-26 ENCOUNTER — Other Ambulatory Visit: Payer: Self-pay

## 2016-08-02 ENCOUNTER — Ambulatory Visit: Payer: Self-pay | Admitting: Surgery

## 2016-08-05 DIAGNOSIS — Z9049 Acquired absence of other specified parts of digestive tract: Secondary | ICD-10-CM

## 2016-08-05 HISTORY — DX: Acquired absence of other specified parts of digestive tract: Z90.49

## 2016-08-05 NOTE — Progress Notes (Deleted)
08/05/2016  HPI: Patient is s/p laparoscopic appendectomy on 10/26.  He was discharged on 10/27 and presented to the ED with abdominal pain at the incision sites on 10/28.  He had bupivacaine injection on the incisions and his pain medication was changed to oral dilaudid.  Vital signs: There were no vitals taken for this visit.   Physical Exam: Constitutional:  Cardiac:   Pulm: Abdomen:  Assessment/Plan: ***   Alex IllJose Luis Donta Mcinroy, MD [facility]

## 2016-08-06 ENCOUNTER — Encounter: Payer: Self-pay | Admitting: Surgery

## 2016-08-10 ENCOUNTER — Emergency Department
Admission: EM | Admit: 2016-08-10 | Discharge: 2016-08-10 | Disposition: A | Discharge status: Home / Self Care | Payer: Self-pay | Attending: Student in an Organized Health Care Education/Training Program | Admitting: Student in an Organized Health Care Education/Training Program

## 2016-08-10 ENCOUNTER — Emergency Department: Payer: Self-pay

## 2016-08-10 ENCOUNTER — Encounter: Payer: Self-pay | Admitting: Emergency Medicine

## 2016-08-10 DIAGNOSIS — Z791 Long term (current) use of non-steroidal anti-inflammatories (NSAID): Secondary | ICD-10-CM | POA: Insufficient documentation

## 2016-08-10 DIAGNOSIS — R079 Chest pain, unspecified: Secondary | ICD-10-CM | POA: Insufficient documentation

## 2016-08-10 DIAGNOSIS — Z79899 Other long term (current) drug therapy: Secondary | ICD-10-CM | POA: Insufficient documentation

## 2016-08-10 DIAGNOSIS — F17200 Nicotine dependence, unspecified, uncomplicated: Secondary | ICD-10-CM | POA: Insufficient documentation

## 2016-08-10 LAB — CBC
HEMATOCRIT: 47.5 % (ref 40.0–52.0)
HEMOGLOBIN: 15.7 g/dL (ref 13.0–18.0)
MCH: 30.6 pg (ref 26.0–34.0)
MCHC: 33 g/dL (ref 32.0–36.0)
MCV: 92.7 fL (ref 80.0–100.0)
Platelets: 247 10*3/uL (ref 150–440)
RBC: 5.12 MIL/uL (ref 4.40–5.90)
RDW: 12.7 % (ref 11.5–14.5)
WBC: 6.5 10*3/uL (ref 3.8–10.6)

## 2016-08-10 LAB — BASIC METABOLIC PANEL
ANION GAP: 9 (ref 5–15)
BUN: 12 mg/dL (ref 6–20)
CHLORIDE: 105 mmol/L (ref 101–111)
CO2: 25 mmol/L (ref 22–32)
Calcium: 9.4 mg/dL (ref 8.9–10.3)
Creatinine, Ser: 1.1 mg/dL (ref 0.61–1.24)
GFR calc Af Amer: >60 mL/min (ref >60)
GLUCOSE: 90 mg/dL (ref 65–99)
POTASSIUM: 3.6 mmol/L (ref 3.5–5.1)
Sodium: 139 mmol/L (ref 135–145)

## 2016-08-10 LAB — TROPONIN I: Troponin I: <0.03 ng/mL (ref <0.03)

## 2016-08-10 LAB — FIBRIN DERIVATIVES D-DIMER (ARMC ONLY): FIBRIN DERIVATIVES D-DIMER (ARMC): 100 (ref 0–499)

## 2016-08-10 MED ORDER — TRAMADOL HCL 50 MG PO TABS: 100.0000 mg | ORAL_TABLET | Freq: Four times a day (QID) | ORAL | 0 refills | Status: DC | PRN

## 2016-08-10 MED ORDER — IBUPROFEN 600 MG PO TABS
600.0000 mg | ORAL_TABLET | Freq: Once | ORAL | Status: AC
  Administered 2016-08-10: 600 mg via ORAL
  Filled 2016-08-10: qty 1

## 2016-08-10 MED ORDER — NAPROXEN 500 MG PO TABS
500.0000 mg | ORAL_TABLET | Freq: Two times a day (BID) | ORAL | 0 refills | Status: DC
Start: 2016-08-10 — End: 2017-04-08

## 2016-08-10 MED ORDER — LORAZEPAM 1 MG PO TABS
1.0000 mg | ORAL_TABLET | Freq: Once | ORAL | Status: AC
  Administered 2016-08-10: 1 mg via ORAL
  Filled 2016-08-10: qty 1

## 2016-08-10 MED ORDER — TRAMADOL HCL 50 MG PO TABS
50.0000 mg | ORAL_TABLET | Freq: Once | ORAL | Status: AC
  Administered 2016-08-10: 50 mg via ORAL
  Filled 2016-08-10: qty 1

## 2016-08-10 NOTE — ED Triage Notes (Addendum)
C/o pain to left chest that radiates to shoulder and neck. Feels like someone sitting on chest.  Feels nauseated. Face was sweating earlier per pt. Started at 1300.  Denies cardiac problems or drug use. Skin dry at this time. Hurts worse when moving

## 2016-08-10 NOTE — ED Provider Notes (Signed)
[facility] Emergency Department Provider Note    First MD Initiated Contact with Patient 08/10/16 1612     (approximate)  I have reviewed the triage vital signs and the nursing notes.   HISTORY  Chief Complaint Chest Pain    HPI Qadir Folks. is a 35 y.o. male who presents with a chief complaint of left-sided chest pain with numbness and tingling radiating down left arm that occurred right around 1:00 while the patient was sitting at home watching TV. Patient denies any pain radiating through to his back. Denies any diaphoresis. Did feel significant weight on his chest and anxiety. States is been undergoing quite a bit of stress recently. Patient was recently admitted for acute appendicitis and underwent surgery. Denies any history of blood clots. Does still smoke daily. Denies any previous history of known cardiac disease. Does not have a history of significant hypertension, hypercholesterolemia, diabetes. Patient denies any shortness of breath at this time.   History reviewed. No pertinent past medical history. History reviewed. No pertinent family history. Past Surgical History:  Procedure Laterality Date  . LAPAROSCOPIC APPENDECTOMY N/A 07/19/2016   Procedure: APPENDECTOMY LAPAROSCOPIC;  Surgeon: [name], MD;  Location: [facility] ORS;  Service: General;  Laterality: N/A;   Patient Active Problem List   Diagnosis Date Noted  . Status post laparoscopic appendectomy 08/05/2016  . Pain following surgery or procedure   . Atrial fibrillation with rapid ventricular response (HCC)       Prior to Admission medications   Medication Sig Start Date End Date Taking? Authorizing Provider  alprazolam Prudy Feeler) 2 MG tablet Take 2 mg by mouth 4 (four) times daily.   Yes Historical Provider, MD  HYDROmorphone (DILAUDID) 2 MG tablet Take 1 tablet (2 mg total) by mouth every 12 (twelve) hours as needed for severe pain. 07/21/16 07/21/17  Minna Antis, MD    ibuprofen (ADVIL,MOTRIN) 600 MG tablet Take 1 tablet (600 mg total) by mouth every 6 (six) hours as needed. 07/21/16   Minna Antis, MD  naproxen (NAPROSYN) 500 MG tablet Take 1 tablet (500 mg total) by mouth 2 (two) times daily with a meal. 08/10/16 08/10/17  Willy Eddy, MD  oxyCODONE (OXY IR/ROXICODONE) 5 MG immediate release tablet Take 1-2 tablets (5-10 mg total) by mouth every 4 (four) hours as needed for moderate pain or severe pain. 07/20/16   [name], MD  traMADol (ULTRAM) 50 MG tablet Take 2 tablets (100 mg total) by mouth every 6 (six) hours as needed. 08/10/16 08/10/17  Willy Eddy, MD    Allergies Penicillins    Social History Social History  Substance Use Topics  . Smoking status: Current Some Day Smoker  . Smokeless tobacco: Never Used  . Alcohol use Yes    Review of Systems Patient denies headaches, rhinorrhea, blurry vision, numbness, shortness of breath, chest pain, edema, cough, abdominal pain, nausea, vomiting, diarrhea, dysuria, fevers, rashes or hallucinations unless otherwise stated above in HPI. ____________________________________________   PHYSICAL EXAM:  VITAL SIGNS: Vitals:   08/10/16 1800 08/10/16 1815  BP:    Pulse: 79 81  Resp: (!) 23 (!) 21  Temp:      Constitutional: Alert and oriented. Well appearing and in no acute distress. Eyes: Conjunctivae are normal. PERRL. EOMI. Head: Atraumatic. Nose: No congestion/rhinnorhea. Mouth/Throat: Mucous membranes are moist.  Oropharynx non-erythematous. Neck: No stridor. Painless ROM. No cervical spine tenderness to palpation Hematological/Lymphatic/Immunilogical: No cervical lymphadenopathy. Cardiovascular: Normal rate, regular rhythm. Grossly normal heart sounds.  Good peripheral circulation. Respiratory: Normal respiratory effort.  No retractions. Lungs CTAB. Gastrointestinal: Soft and nontender. No distention. No abdominal bruits. No CVA tenderness. Musculoskeletal: No lower  extremity tenderness nor edema.  No joint effusions. Neurologic:  Normal speech and language. No gross focal neurologic deficits are appreciated. No gait instability. Skin:  Skin is warm, dry and intact. No rash noted. Psychiatric: Mood and affect are normal. Speech and behavior are normal.  ____________________________________________   LABS (all labs ordered are listed, but only abnormal results are displayed)  Results for orders placed or performed during the hospital encounter of 08/10/16 (from the past 24 hour(s))  Basic metabolic panel     Status: None   Collection Time: 08/10/16  3:24 PM  Result Value Ref Range   Sodium 139 135 - 145 mmol/L   Potassium 3.6 3.5 - 5.1 mmol/L   Chloride 105 101 - 111 mmol/L   CO2 25 22 - 32 mmol/L   Glucose, Bld 90 65 - 99 mg/dL   BUN 12 6 - 20 mg/dL   Creatinine, Ser 9.561.10 0.61 - 1.24 mg/dL   Calcium 9.4 8.9 - 21.310.3 mg/dL   GFR calc non Af Amer >60 >60 mL/min   GFR calc Af Amer >60 >60 mL/min   Anion gap 9 5 - 15  CBC     Status: None   Collection Time: 08/10/16  3:24 PM  Result Value Ref Range   WBC 6.5 3.8 - 10.6 K/uL   RBC 5.12 4.40 - 5.90 MIL/uL   Hemoglobin 15.7 13.0 - 18.0 g/dL   HCT 08.647.5 57.840.0 - 46.952.0 %   MCV 92.7 80.0 - 100.0 fL   MCH 30.6 26.0 - 34.0 pg   MCHC 33.0 32.0 - 36.0 g/dL   RDW 62.912.7 52.811.5 - 41.314.5 %   Platelets 247 150 - 440 K/uL  Troponin I     Status: None   Collection Time: 08/10/16  3:24 PM  Result Value Ref Range   Troponin I <0.03 <0.03 ng/mL  Fibrin derivatives D-Dimer ([facility] only)     Status: None   Collection Time: 08/10/16  3:24 PM  Result Value Ref Range   Fibrin derivatives D-dimer ([facility]) 100 0 - 499  Troponin I     Status: None   Collection Time: 08/10/16  5:41 PM  Result Value Ref Range   Troponin I <0.03 <0.03 ng/mL   ____________________________________________  EKG My review and personal interpretation at Time: 15:18   Indication: chest paion  Rate: 85  Rhythm: sinus Axis: normal Other: no acute  ischemic changes, normal intervals ____________________________________________  RADIOLOGY  I personally reviewed all radiographic images ordered to evaluate for the above acute complaints and reviewed radiology reports and findings.  These findings were personally discussed with the patient.  Please see medical record for radiology report. ____________________________________________   PROCEDURES  Procedure(s) performed: none Procedures    Critical Care performed: no ____________________________________________   INITIAL IMPRESSION / ASSESSMENT AND PLAN / ED COURSE  Pertinent labs & imaging results that were available during my care of the patient were reviewed by me and considered in my medical decision making (see chart for details).  DDX: .ddcchest   Hal NeerKenneth G Holdren Jr. is a 35 y.o. who presents to the ED with Chest pain that started at 1 PM today while at rest. Patient arrives hyperventilating appears very stressed. Otherwise afebrile and hemodynamic stable. Patient is at increased risk for DVT given recent surgery. Otherwise without any hypoxia. We'll send  d-dimer to further risk stratify. His EKG shows no evidence of acute ischemia and his troponin is negative. Patient is low risk heart score of 1 therefore feel this is not clinically consistent with ACS. No evidence of pneumonia or pneumothorax on chest x-ray. His abdominal exam is soft and benign. Do not feel CT imaging clinically indicated. No evidence of of fever or acute infectious process at this time. Patient does appear very anxious and states he is undergoing a lot of stress therefore we will provide anxiolysis in the ED while further risk stratifying for Pe.  The patient will be placed on continuous pulse oximetry and telemetry for monitoring.  Laboratory evaluation will be sent to evaluate for the above complaints.     Clinical Course as of Aug 10 1938  Caleen EssexFri Aug 10, 2016  1735 D-dimer is negative. Not likely  consistent with pulmonary embolism.  He remains hemodynamic stable.  Is not consistent with ACS. No evidence of pericarditis. Possible pleuritis or muscle skeletal strain. Does not appear to be pain referred from the abdomen. We'll start treatment with conservative management and follow-up with PCP.  [PR]    Clinical Course User Index [PR] Willy EddyPatrick Modell Fendrick, MD     ____________________________________________   FINAL CLINICAL IMPRESSION(S) / ED DIAGNOSES  Final diagnoses:  Chest pain, unspecified type      NEW MEDICATIONS STARTED DURING THIS VISIT:  Discharge Medication List as of 08/10/2016  6:17 PM    START taking these medications   Details  naproxen (NAPROSYN) 500 MG tablet Take 1 tablet (500 mg total) by mouth 2 (two) times daily with a meal., Starting Fri 08/10/2016, Until Sat 08/10/2017, Print    traMADol (ULTRAM) 50 MG tablet Take 2 tablets (100 mg total) by mouth every 6 (six) hours as needed., Starting Fri 08/10/2016, Until Sat 08/10/2017, Print         Note:  This document was prepared using Dragon voice recognition software and may include unintentional dictation errors.    Willy EddyPatrick Montie Gelardi, MD 08/10/16 [phone]

## 2016-08-10 NOTE — ED Notes (Signed)
Resumed care from [place] rn.  Pt alert.  Pt has left side chest pain.  nsr on monitor.  Skin warm and dry.  Family in with pt.

## 2016-10-21 ENCOUNTER — Encounter: Payer: Self-pay | Admitting: Radiology

## 2016-10-21 ENCOUNTER — Emergency Department
Admission: EM | Admit: 2016-10-21 | Discharge: 2016-10-21 | Disposition: A | Discharge status: Home / Self Care | Payer: Self-pay | Attending: Emergency Medicine | Admitting: Emergency Medicine

## 2016-10-21 ENCOUNTER — Emergency Department: Payer: Self-pay

## 2016-10-21 DIAGNOSIS — Z79899 Other long term (current) drug therapy: Secondary | ICD-10-CM | POA: Insufficient documentation

## 2016-10-21 DIAGNOSIS — R0789 Other chest pain: Secondary | ICD-10-CM | POA: Insufficient documentation

## 2016-10-21 DIAGNOSIS — F1490 Cocaine use, unspecified, uncomplicated: Secondary | ICD-10-CM | POA: Insufficient documentation

## 2016-10-21 DIAGNOSIS — F17200 Nicotine dependence, unspecified, uncomplicated: Secondary | ICD-10-CM | POA: Insufficient documentation

## 2016-10-21 LAB — BASIC METABOLIC PANEL
ANION GAP: 8 (ref 5–15)
BUN: 10 mg/dL (ref 6–20)
CALCIUM: 9 mg/dL (ref 8.9–10.3)
CO2: 27 mmol/L (ref 22–32)
Chloride: 104 mmol/L (ref 101–111)
Creatinine, Ser: 1.2 mg/dL (ref 0.61–1.24)
GFR calc Af Amer: >60 mL/min (ref >60)
Glucose, Bld: 98 mg/dL (ref 65–99)
POTASSIUM: 3.6 mmol/L (ref 3.5–5.1)
SODIUM: 139 mmol/L (ref 135–145)

## 2016-10-21 LAB — CBC
HEMATOCRIT: 41.7 % (ref 40.0–52.0)
HEMOGLOBIN: 14.5 g/dL (ref 13.0–18.0)
MCH: 31.9 pg (ref 26.0–34.0)
MCHC: 34.9 g/dL (ref 32.0–36.0)
MCV: 91.5 fL (ref 80.0–100.0)
Platelets: 283 10*3/uL (ref 150–440)
RBC: 4.56 MIL/uL (ref 4.40–5.90)
RDW: 12.8 % (ref 11.5–14.5)
WBC: 5.7 10*3/uL (ref 3.8–10.6)

## 2016-10-21 LAB — TROPONIN I

## 2016-10-21 MED ORDER — IOPAMIDOL (ISOVUE-370) INJECTION 76%
75.0000 mL | Freq: Once | INTRAVENOUS | Status: AC | PRN
  Administered 2016-10-21: 75 mL via INTRAVENOUS

## 2016-10-21 MED ORDER — KETOROLAC TROMETHAMINE 30 MG/ML IJ SOLN
30.0000 mg | Freq: Once | INTRAMUSCULAR | Status: AC
  Administered 2016-10-21: 30 mg via INTRAVENOUS

## 2016-10-21 MED ORDER — KETOROLAC TROMETHAMINE 30 MG/ML IJ SOLN
INTRAMUSCULAR | Status: AC
  Administered 2016-10-21: 30 mg via INTRAVENOUS
  Filled 2016-10-21: qty 1

## 2016-10-21 MED ORDER — SODIUM CHLORIDE 0.9 % IV BOLUS (SEPSIS)
1000.0000 mL | Freq: Once | INTRAVENOUS | Status: AC
Start: 2016-10-21 — End: 2016-10-21
  Administered 2016-10-21: 1000 mL via INTRAVENOUS

## 2016-10-21 NOTE — ED Notes (Signed)
Patient reports he was awakened from sleep Saturday morning due to pain. Pt resports sharp/squeezing chest pain, SOB, N/V, diaphoresis, dizziness/lightheadedness, and weakness (all Saturday)  Patient currently c/o left sided chest pain, radiation to back, left arm and side. Pt c/o SOB, and weakness.

## 2016-10-21 NOTE — ED Notes (Signed)
CT notified of IV placement. 

## 2016-10-21 NOTE — ED Provider Notes (Addendum)
[facility] Emergency Department Provider Note  ____________________________________________   I have reviewed the triage vital signs and the nursing notes.   HISTORY  Chief Complaint Chest Pain    HPI Alex Alex G Bruns Jr. is a 36 y.o. male  who presents today complaining of left-sided chest pain which is been there every day all day for 2 months. We have seen him for this before. Patient does have some family history of heart disease. He himself denies other medical problems. He has a history of anxiety but does not feel this is because of anxiety. Sometimes he states it hurts to breathe but he doesn't have any real shortness of breath. Nothing really makes the pain better and nothing makes it worse. It is somewhat positional. The patient is worried he might be having a heart attack for the last 2 months. When I ask him of his depressed he states he is not although he has decreased energy. He states he has no SI or HI. I asked him if he is under any increased stress and he said no but at times that he actually lost his job right before the pain started and has been looking for a job since that time and is suffering from this pain ever since he lost his job.  Pain is sharp.   No past medical history on file.  Patient Active Problem List   Diagnosis Date Noted  . Status post laparoscopic appendectomy 08/05/2016  . Pain following surgery or procedure   . Atrial fibrillation with rapid ventricular response [facility](HCC)     Past Surgical History:  Procedure Laterality Date  . LAPAROSCOPIC APPENDECTOMY N/A 07/19/2016   Procedure: APPENDECTOMY LAPAROSCOPIC;  Surgeon: Henrene DodgeJose Piscoya, MD;  Location: [facility] ORS;  Service: General;  Laterality: N/A;    Prior to Admission medications   Medication Sig Start Date End Date Taking? Authorizing Provider  alprazolam Prudy Feeler(XANAX) 2 MG tablet Take 2 mg by mouth 4 (four) times daily.    Historical Provider, MD  HYDROmorphone (DILAUDID) 2 MG  tablet Take 1 tablet (2 mg total) by mouth every 12 (twelve) hours as needed for severe pain. 07/21/16 07/21/17  Minna AntisKevin Paduchowski, MD  ibuprofen (ADVIL,MOTRIN) 600 MG tablet Take 1 tablet (600 mg total) by mouth every 6 (six) hours as needed. 07/21/16   Minna AntisKevin Paduchowski, MD  naproxen (NAPROSYN) 500 MG tablet Take 1 tablet (500 mg total) by mouth 2 (two) times daily with a meal. 08/10/16 08/10/17  Willy EddyPatrick Robinson, MD  oxyCODONE (OXY IR/ROXICODONE) 5 MG immediate release tablet Take 1-2 tablets (5-10 mg total) by mouth every 4 (four) hours as needed for moderate pain or severe pain. 07/20/16   Henrene DodgeJose Piscoya, MD  traMADol (ULTRAM) 50 MG tablet Take 2 tablets (100 mg total) by mouth every 6 (six) hours as needed. 08/10/16 08/10/17  Willy EddyPatrick Robinson, MD    Allergies Penicillins  No family history on file.  Social History Social History  Substance Use Topics  . Smoking status: Current Some Day Smoker  . Smokeless tobacco: Never Used  . Alcohol use Yes    Review of Systems Constitutional: No fever/chills Eyes: No visual changes. ENT: No sore throat. No stiff neck no neck pain Cardiovascular: Positive chest pain. Respiratory: Denies shortness of breath. Gastrointestinal:   no vomiting.  No diarrhea.  No constipation. Genitourinary: Negative for dysuria. Musculoskeletal: Negative lower extremity swelling Skin: Negative for rash. Neurological: Negative for severe headaches, focal weakness or numbness. 10-point ROS otherwise negative.  ____________________________________________  PHYSICAL EXAM:  VITAL SIGNS: ED Triage Vitals  Enc Vitals Group     BP 10/21/16 0210 (!) 125/95     Pulse Rate 10/21/16 0210 91     Resp 10/21/16 0210 19     Temp --      Temp src --      SpO2 10/21/16 0210 99 %     Weight 10/21/16 0107 194 lb (88 kg)     Height 10/21/16 0107 6\' 1"  (1.854 m)     Head Circumference --      Peak Flow --      Pain Score 10/21/16 0107 8     Pain Loc --      Pain  Edu? --      Excl. in GC? --     Constitutional: Alert and oriented. Well appearing and in no acute distress.She is anxious and upset but in no acute medical distress. Eyes: Conjunctivae are normal. PERRL. EOMI. Head: Atraumatic. Nose: No congestion/rhinnorhea. Mouth/Throat: Mucous membranes are moist.  Oropharynx non-erythematous. Neck: No stridor.   Nontender with no meningismus Cardiovascular: Normal rate, regular rhythm. Grossly normal heart sounds.  Good peripheral circulation. Chest: Tender to palpation left chest wall which reproduces pain no shingles noted, no flail chest or crepitus much of cesarean he states "ouch that's the pain right there". No inflammation noted. Respiratory: Normal respiratory effort.  No retractions. Lungs CTAB. Abdominal: Soft and nontender. No distention. No guarding no rebound Back:  There is no focal tenderness or step off.  there is no midline tenderness there are no lesions noted. there is no CVA tenderness Musculoskeletal: No lower extremity tenderness, no upper extremity tenderness. No joint effusions, no DVT signs strong distal pulses no edema Neurologic:  Normal speech and language. No gross focal neurologic deficits are appreciated.  Skin:  Skin is warm, dry and intact. No rash noted. Psychiatric: Mood and affect are anxious. Speech and behavior are normal.  ____________________________________________   LABS (all labs ordered are listed, but only abnormal results are displayed)  Labs Reviewed  BASIC METABOLIC PANEL  CBC  TROPONIN I   ____________________________________________  EKG  I personally interpreted any EKGs ordered by me or triage Normal sinus rhythm rate 90 bpm no acute ST elevation or acute ST depression normal axis remarkable EKG ____________________________________________  RADIOLOGY  I reviewed any imaging ordered by me or triage that were performed during my shift and, if possible, patient and/or family made aware of  any abnormal findings. ____________________________________________   PROCEDURES  Procedure(s) performed: None  Procedures  Critical Care performed: None  ____________________________________________   INITIAL IMPRESSION / ASSESSMENT AND PLAN / ED COURSE  Pertinent labs & imaging results that were available during my care of the patient were reviewed by me and considered in my medical decision making (see chart for details).  Obviously, we do not believe the patient is been having a heart attack every day all day for 2 months. Nonetheless we did check a troponin which is reassuringly negative. My concern is that this is a somatic pathology, this pain started and a very anxious patient immediate after losing his job. He has no SI or HI and he has no insight into this possibility. Unfortunately, it is also possible other pathology is present and that clearly is a diagnoses of exclusion. Given that this is a second visit for this patient for this severe chest pain we'll obtain a CT scan of his chest to rule out pulmonary embolism as it is  certainly a possibility although he is perk negative and I feel not likely to have one. He has no real risk factors for that reason complaining of severe left-sided chest pain and certainly one cannot attribute that to anxiety without further workup. If that is negative and feel the patient will be safe for close outpatient follow-up. Patient did fail to follow-up after he saw his last time I have stressed the importance of the emergency room and the entire picture of his health and the need for further outpatient workup for these now chronic pains.   ----------------------------------------- 5:24 AM on 10/21/2016 -----------------------------------------  Awaiting CT results, patient requesting "something stronger" and Toradol. We will not give him narcotic pain medication for the bruise will chest wall pain he has now had since November. Abdomen remains  completely benign do not feel this is referred abdominal pathology.    ____________________________________________   FINAL CLINICAL IMPRESSION(S) / ED DIAGNOSES  Final diagnoses:  None      This chart was dictated using voice recognition software.  Despite best efforts to proofread,  errors can occur which can change meaning.      Jeanmarie Plant, MD 10/21/16 [id number]    Jeanmarie Plant, MD 10/21/16 [phone]

## 2016-10-21 NOTE — ED Triage Notes (Signed)
Patient reports woke Saturday morning with left sided chest pain that has gotten progressives worse. Patient describes it as sharpe in nature.

## 2017-01-18 ENCOUNTER — Emergency Department
Admission: EM | Admit: 2017-01-18 | Discharge: 2017-01-18 | Disposition: A | Discharge status: Home / Self Care | Payer: Self-pay | Attending: Emergency Medicine | Admitting: Emergency Medicine

## 2017-01-18 DIAGNOSIS — Z79899 Other long term (current) drug therapy: Secondary | ICD-10-CM | POA: Insufficient documentation

## 2017-01-18 DIAGNOSIS — Z791 Long term (current) use of non-steroidal anti-inflammatories (NSAID): Secondary | ICD-10-CM | POA: Insufficient documentation

## 2017-01-18 DIAGNOSIS — Y9389 Activity, other specified: Secondary | ICD-10-CM | POA: Insufficient documentation

## 2017-01-18 DIAGNOSIS — S0501XA Injury of conjunctiva and corneal abrasion without foreign body, right eye, initial encounter: Secondary | ICD-10-CM | POA: Insufficient documentation

## 2017-01-18 DIAGNOSIS — Y929 Unspecified place or not applicable: Secondary | ICD-10-CM | POA: Insufficient documentation

## 2017-01-18 DIAGNOSIS — Y990 Civilian activity done for income or pay: Secondary | ICD-10-CM | POA: Insufficient documentation

## 2017-01-18 DIAGNOSIS — W2209XA Striking against other stationary object, initial encounter: Secondary | ICD-10-CM | POA: Insufficient documentation

## 2017-01-18 DIAGNOSIS — F17200 Nicotine dependence, unspecified, uncomplicated: Secondary | ICD-10-CM | POA: Insufficient documentation

## 2017-01-18 MED ORDER — OXYCODONE-ACETAMINOPHEN 5-325 MG PO TABS: 1.0000 | ORAL_TABLET | Freq: Three times a day (TID) | ORAL | 0 refills | Status: AC | PRN

## 2017-01-18 MED ORDER — POLYMYXIN B-TRIMETHOPRIM 10000-0.1 UNIT/ML-% OP SOLN: 1.0000 [drp] | OPHTHALMIC | 0 refills | Status: AC

## 2017-01-18 MED ORDER — FLUORESCEIN SODIUM 0.6 MG OP STRP
1.0000 | ORAL_STRIP | Freq: Once | OPHTHALMIC | Status: AC
  Administered 2017-01-18: 1 via OPHTHALMIC

## 2017-01-18 MED ORDER — FLUORESCEIN SODIUM 0.6 MG OP STRP
ORAL_STRIP | OPHTHALMIC | Status: AC
  Filled 2017-01-18: qty 1

## 2017-01-18 MED ORDER — DICLOFENAC SODIUM 0.1 % OP SOLN: 2.0000 [drp] | Freq: Four times a day (QID) | OPHTHALMIC | 0 refills | Status: AC

## 2017-01-18 NOTE — ED Provider Notes (Signed)
[facility] Emergency Department Provider Note  ____________________________________________  Time seen: Approximately 9:54 PM  I have reviewed the triage vital signs and the nursing notes.   HISTORY  Chief Complaint Eye Injury    HPI Alex Wilkins. is a 36 y.o. male presenting to the emergency department with right eye pain after a branch from an apple tree collided with his eye. Patient has had photophobia and 10 out of 10 right eye pain. Patient noticed increased right eye tearing. Patient denies pain with extraocular eye muscle movement. Patient wears eyeglasses at baseline. He has noticed no changes in visual acuity. No alleviating measures have been undertaken. Patient denies nausea, vomiting and eye discharge.   History reviewed. No pertinent past medical history.  Patient Active Problem List   Diagnosis Date Noted  . Status post laparoscopic appendectomy 08/05/2016  . Pain following surgery or procedure   . Atrial fibrillation with rapid ventricular response [facility])     Past Surgical History:  Procedure Laterality Date  . LAPAROSCOPIC APPENDECTOMY N/A 07/19/2016   Procedure: APPENDECTOMY LAPAROSCOPIC;  Surgeon: [name], MD;  Location: [facility] ORS;  Service: General;  Laterality: N/A;    Prior to Admission medications   Medication Sig Start Date End Date Taking? Authorizing Provider  alprazolam Prudy Feeler) 2 MG tablet Take 2 mg by mouth 4 (four) times daily.    Historical Provider, MD  diclofenac (VOLTAREN) 0.1 % ophthalmic solution Place 2 drops into the right eye 4 (four) times daily. 01/18/17 01/22/17  Orvil Feil, PA-C  HYDROmorphone (DILAUDID) 2 MG tablet Take 1 tablet (2 mg total) by mouth every 12 (twelve) hours as needed for severe pain. 07/21/16 07/21/17  Minna Antis, MD  ibuprofen (ADVIL,MOTRIN) 600 MG tablet Take 1 tablet (600 mg total) by mouth every 6 (six) hours as needed. 07/21/16   Minna Antis, MD  naproxen (NAPROSYN)  500 MG tablet Take 1 tablet (500 mg total) by mouth 2 (two) times daily with a meal. 08/10/16 08/10/17  Willy Eddy, MD  oxyCODONE (OXY IR/ROXICODONE) 5 MG immediate release tablet Take 1-2 tablets (5-10 mg total) by mouth every 4 (four) hours as needed for moderate pain or severe pain. 07/20/16   [name], MD  oxyCODONE-acetaminophen (ROXICET) 5-325 MG tablet Take 1 tablet by mouth every 8 (eight) hours as needed for severe pain. 01/18/17 01/21/17  Orvil Feil, PA-C  traMADol (ULTRAM) 50 MG tablet Take 2 tablets (100 mg total) by mouth every 6 (six) hours as needed. 08/10/16 08/10/17  Willy Eddy, MD  trimethoprim-polymyxin b (POLYTRIM) ophthalmic solution Place 1 drop into the right eye every 4 (four) hours. 01/18/17 01/28/17  Orvil Feil, PA-C    Allergies Penicillins  History reviewed. No pertinent family history.  Social History Social History  Substance Use Topics  . Smoking status: Current Some Day Smoker  . Smokeless tobacco: Never Used  . Alcohol use Yes     Review of Systems  Constitutional: No fever/chills Eyes: Patient has right eye pain. No discharge ENT: No upper respiratory complaints. Cardiovascular: no chest pain. Respiratory: no cough. No SOB. Gastrointestinal: No abdominal pain.  No nausea, no vomiting.  No diarrhea.  No constipation. Skin: Negative for rash, abrasions, lacerations, ecchymosis. Neurological: Negative for headaches, focal weakness or numbness. ____________________________________________   PHYSICAL EXAM:  VITAL SIGNS: ED Triage Vitals  Enc Vitals Group     BP 01/18/17 2035 (!) 130/91     Pulse Rate 01/18/17 2035 90  Resp 01/18/17 2035 18     Temp 01/18/17 2035 98.1 F (36.7 C)     Temp Source 01/18/17 2035 Oral     SpO2 01/18/17 2035 99 %     Weight 01/18/17 2028 169 lb (76.7 kg)     Height 01/18/17 2028  (1.854 m)     Head Circumference --      Peak Flow --      Pain Score 01/18/17 2027 8     Pain Loc --       Pain Edu? --      Excl. in GC? --      Constitutional: Alert and oriented. Well appearing and in no acute distress. Eyes: Conjunctivae are normal. PERRL. EOMI. Patient has a region of uptake with fluorescein staining, right eye. No erythema or edema of the upper and lower eyelids, right. No crusting or exudate visualized at the medial canthus or eyelashes, right. Head: Atraumatic. ENT:      Ears: Tympanic membranes are pearly bilaterally.      Nose: No congestion/rhinnorhea.      Mouth/Throat: Mucous membranes are moist.  Cardiovascular: Normal rate, regular rhythm. Normal S1 and S2.  Good peripheral circulation. Respiratory: Normal respiratory effort without tachypnea or retractions. Lungs CTAB. Good air entry to the bases with no decreased or absent breath sounds. Skin:  Skin is warm, dry and intact. No rash noted. Psychiatric: Mood and affect are normal. Speech and behavior are normal. Patient exhibits appropriate insight and judgement.  ____________________________________________   LABS (all labs ordered are listed, but only abnormal results are displayed)  Labs Reviewed - No data to display ____________________________________________  EKG   ____________________________________________  RADIOLOGY   No results found.  ____________________________________________    PROCEDURES  Procedure(s) performed:    Procedures    Medications  fluorescein ophthalmic strip 1 strip (1 strip Right Eye Given 01/18/17 2139)     ____________________________________________   INITIAL IMPRESSION / ASSESSMENT AND PLAN / ED COURSE  Pertinent labs & imaging results that were available during my care of the patient were reviewed by me and considered in my medical decision making (see chart for details).  Review of the [place] CSRS was performed in accordance of the [facility] prior to dispensing any controlled drugs.  Assessment and plan: Corneal Abrasion:  Patient presents to the  emergency department with right eye pain, increased tearing and photophobia. On physical exam, patient had a region of uptake with fluorescein staining. History and physical exam findings are consistent with corneal abrasion. Patient was discharged with Roxicet, topical Toradol and Polytrim ophthalmic solution. A referral was made to ophthalmology. Vital signs were reassuring prior to discharge. Patient denies nausea and vomiting. Strict return precautions were given. Patient voiced was understanding regarding these return precautions. All patient questions were answered.  ____________________________________________  FINAL CLINICAL IMPRESSION(S) / ED DIAGNOSES  Final diagnoses:  Abrasion of right cornea, initial encounter      NEW MEDICATIONS STARTED DURING THIS VISIT:  Discharge Medication List as of 01/18/2017 10:07 PM    START taking these medications   Details  diclofenac (VOLTAREN) 0.1 % ophthalmic solution Place 2 drops into the right eye 4 (four) times daily., Starting Fri 01/18/2017, Until Tue 01/22/2017, Print    oxyCODONE-acetaminophen (ROXICET) 5-325 MG tablet Take 1 tablet by mouth every 8 (eight) hours as needed for severe pain., Starting Fri 01/18/2017, Until Mon 01/21/2017, Print    trimethoprim-polymyxin b (POLYTRIM) ophthalmic solution Place 1 drop into the right eye every  4 (four) hours., Starting Fri 01/18/2017, Until Mon 01/28/2017, Print            This chart was dictated using voice recognition software/Dragon. Despite best efforts to proofread, errors can occur which can change the meaning. Any change was purely unintentional.    Orvil Feil, PA-C 01/18/17 2231    Sharman Cheek, MD 01/21/17 [phone]

## 2017-01-18 NOTE — ED Notes (Signed)
Visual acuity assessed. Pt is only able to see the top line with the LEFT eye (20/200) and unable to open/see with the RIGHT eye.

## 2017-01-18 NOTE — ED Triage Notes (Signed)
Pt presents to ED via POV with c/o eye injury that happened around 4pm today. Pt states he was working removing tree branches when once poked him in the RIGHT eye. Pt's eyelids are red and swollen; pt reports he cannot open eye without discomfort. Pt states he supposed to wear glasses but does not on a regular bases; does not wear contacts.

## 2017-01-28 ENCOUNTER — Emergency Department: Payer: Self-pay

## 2017-01-28 ENCOUNTER — Emergency Department
Admission: EM | Admit: 2017-01-28 | Discharge: 2017-01-28 | Disposition: A | Discharge status: Home / Self Care | Payer: Self-pay | Attending: Emergency Medicine | Admitting: Emergency Medicine

## 2017-01-28 ENCOUNTER — Encounter: Payer: Self-pay | Admitting: Emergency Medicine

## 2017-01-28 DIAGNOSIS — R42 Dizziness and giddiness: Secondary | ICD-10-CM | POA: Insufficient documentation

## 2017-01-28 DIAGNOSIS — Z5321 Procedure and treatment not carried out due to patient leaving prior to being seen by health care provider: Secondary | ICD-10-CM | POA: Insufficient documentation

## 2017-01-28 DIAGNOSIS — F17290 Nicotine dependence, other tobacco product, uncomplicated: Secondary | ICD-10-CM | POA: Insufficient documentation

## 2017-01-28 DIAGNOSIS — R0602 Shortness of breath: Secondary | ICD-10-CM | POA: Insufficient documentation

## 2017-01-28 LAB — COMPREHENSIVE METABOLIC PANEL
ALK PHOS: 53 U/L (ref 38–126)
ALT: 15 U/L — ABNORMAL LOW (ref 17–63)
ANION GAP: 5 (ref 5–15)
AST: 24 U/L (ref 15–41)
Albumin: 3.5 g/dL (ref 3.5–5.0)
BILIRUBIN TOTAL: 1.1 mg/dL (ref 0.3–1.2)
BUN: 9 mg/dL (ref 6–20)
CALCIUM: 8.9 mg/dL (ref 8.9–10.3)
CO2: 31 mmol/L (ref 22–32)
CREATININE: 1.09 mg/dL (ref 0.61–1.24)
Chloride: 104 mmol/L (ref 101–111)
Glucose, Bld: 82 mg/dL (ref 65–99)
Potassium: 3.4 mmol/L — ABNORMAL LOW (ref 3.5–5.1)
Sodium: 140 mmol/L (ref 135–145)
TOTAL PROTEIN: 5.8 g/dL — AB (ref 6.5–8.1)

## 2017-01-28 LAB — CBC
HCT: 45.2 % (ref 40.0–52.0)
Hemoglobin: 15.2 g/dL (ref 13.0–18.0)
MCH: 30.8 pg (ref 26.0–34.0)
MCHC: 33.5 g/dL (ref 32.0–36.0)
MCV: 92 fL (ref 80.0–100.0)
PLATELETS: 224 10*3/uL (ref 150–440)
RBC: 4.92 MIL/uL (ref 4.40–5.90)
RDW: 13 % (ref 11.5–14.5)
WBC: 5.5 10*3/uL (ref 3.8–10.6)

## 2017-01-28 LAB — LIPASE, BLOOD: Lipase: 37 U/L (ref 11–51)

## 2017-01-28 LAB — TROPONIN I

## 2017-01-28 NOTE — ED Notes (Signed)
Patient left at this time.  Patient states, "I have an uncle at [place] who is a doctor who says he will take me back right away."  This RN told patient I would be happy to get him a room at this time but patient refused.

## 2017-01-28 NOTE — ED Triage Notes (Signed)
Pt reports shortness of breath and dizziness for the past few days. Pt reports it's hard to him to take a deep breath, reports pain with deep inspiration.

## 2017-01-30 ENCOUNTER — Telehealth: Payer: Self-pay | Admitting: Emergency Medicine

## 2017-01-30 NOTE — Telephone Encounter (Signed)
Called patient due to lwot to inquire about condition and follow up plans. Number disconnected 

## 2017-03-09 ENCOUNTER — Emergency Department
Admission: EM | Admit: 2017-03-09 | Discharge: 2017-03-09 | Disposition: A | Discharge status: Home / Self Care | Payer: Medicaid Other | Attending: Emergency Medicine | Admitting: Emergency Medicine

## 2017-03-09 DIAGNOSIS — T424X1A Poisoning by benzodiazepines, accidental (unintentional), initial encounter: Secondary | ICD-10-CM | POA: Diagnosis not present

## 2017-03-09 DIAGNOSIS — Z791 Long term (current) use of non-steroidal anti-inflammatories (NSAID): Secondary | ICD-10-CM | POA: Diagnosis not present

## 2017-03-09 DIAGNOSIS — R4182 Altered mental status, unspecified: Secondary | ICD-10-CM | POA: Diagnosis not present

## 2017-03-09 DIAGNOSIS — Z87898 Personal history of other specified conditions: Secondary | ICD-10-CM | POA: Insufficient documentation

## 2017-03-09 DIAGNOSIS — F17200 Nicotine dependence, unspecified, uncomplicated: Secondary | ICD-10-CM | POA: Insufficient documentation

## 2017-03-09 DIAGNOSIS — Z79891 Long term (current) use of opiate analgesic: Secondary | ICD-10-CM | POA: Insufficient documentation

## 2017-03-09 LAB — URINALYSIS, COMPLETE (UACMP) WITH MICROSCOPIC
BACTERIA UA: NONE SEEN
BILIRUBIN URINE: NEGATIVE
Glucose, UA: NEGATIVE mg/dL
HGB URINE DIPSTICK: NEGATIVE
Ketones, ur: NEGATIVE mg/dL
Leukocytes, UA: NEGATIVE
Nitrite: NEGATIVE
PH: 6 (ref 5.0–8.0)
Protein, ur: NEGATIVE mg/dL
SPECIFIC GRAVITY, URINE: 1.025 (ref 1.005–1.030)

## 2017-03-09 LAB — CBC WITH DIFFERENTIAL/PLATELET
BASOS ABS: 0 10*3/uL (ref 0–0.1)
Basophils Relative: 0 %
EOS PCT: 1 %
Eosinophils Absolute: 0.1 10*3/uL (ref 0–0.7)
HEMATOCRIT: 44 % (ref 40.0–52.0)
Hemoglobin: 14.9 g/dL (ref 13.0–18.0)
LYMPHS PCT: 22 %
Lymphs Abs: 1.3 10*3/uL (ref 1.0–3.6)
MCH: 31.7 pg (ref 26.0–34.0)
MCHC: 33.9 g/dL (ref 32.0–36.0)
MCV: 93.5 fL (ref 80.0–100.0)
MONO ABS: 0.5 10*3/uL (ref 0.2–1.0)
MONOS PCT: 9 %
Neutro Abs: 3.9 10*3/uL (ref 1.4–6.5)
Neutrophils Relative %: 68 %
PLATELETS: 223 10*3/uL (ref 150–440)
RBC: 4.7 MIL/uL (ref 4.40–5.90)
RDW: 13.4 % (ref 11.5–14.5)
WBC: 5.9 10*3/uL (ref 3.8–10.6)

## 2017-03-09 LAB — URINE DRUG SCREEN, QUALITATIVE (ARMC ONLY)
Amphetamines, Ur Screen: POSITIVE — AB
BARBITURATES, UR SCREEN: NOT DETECTED
BENZODIAZEPINE, UR SCRN: POSITIVE — AB
CANNABINOID 50 NG, UR ~~LOC~~: NOT DETECTED
COCAINE METABOLITE, UR ~~LOC~~: NOT DETECTED
MDMA (Ecstasy)Ur Screen: NOT DETECTED
Methadone Scn, Ur: NOT DETECTED
Opiate, Ur Screen: NOT DETECTED
Phencyclidine (PCP) Ur S: NOT DETECTED
Tricyclic, Ur Screen: NOT DETECTED

## 2017-03-09 LAB — COMPREHENSIVE METABOLIC PANEL
ALT: 13 U/L — ABNORMAL LOW (ref 17–63)
ANION GAP: 6 (ref 5–15)
AST: 17 U/L (ref 15–41)
Albumin: 3.8 g/dL (ref 3.5–5.0)
Alkaline Phosphatase: 73 U/L (ref 38–126)
BILIRUBIN TOTAL: 0.7 mg/dL (ref 0.3–1.2)
BUN: 13 mg/dL (ref 6–20)
CHLORIDE: 104 mmol/L (ref 101–111)
CO2: 29 mmol/L (ref 22–32)
Calcium: 8.7 mg/dL — ABNORMAL LOW (ref 8.9–10.3)
Creatinine, Ser: 1.21 mg/dL (ref 0.61–1.24)
Glucose, Bld: 103 mg/dL — ABNORMAL HIGH (ref 65–99)
POTASSIUM: 3.2 mmol/L — AB (ref 3.5–5.1)
Sodium: 139 mmol/L (ref 135–145)
TOTAL PROTEIN: 6.5 g/dL (ref 6.5–8.1)

## 2017-03-09 LAB — ETHANOL

## 2017-03-09 LAB — SALICYLATE LEVEL

## 2017-03-09 LAB — ACETAMINOPHEN LEVEL

## 2017-03-09 MED ORDER — SODIUM CHLORIDE 0.9 % IV BOLUS (SEPSIS)
1000.0000 mL | Freq: Once | INTRAVENOUS | Status: AC
  Administered 2017-03-09: 1000 mL via INTRAVENOUS

## 2017-03-09 NOTE — ED Notes (Addendum)
Pt wife speaking to pt on the phone

## 2017-03-09 NOTE — ED Triage Notes (Signed)
Per EMS pt was found in his car sleeping.  Pt responds to voice but is sleepy and sluggish.  Pupils are a 4 and sluggish.

## 2017-03-09 NOTE — ED Provider Notes (Signed)
[facility] Emergency Department Provider Note  ____________________________________________   First MD Initiated Contact with Patient 03/09/17 0805     (approximate)  I have reviewed the triage vital signs and the nursing notes.   HISTORY  Chief Complaint Altered Mental Status   HPI Alex Wilkins. is a 36 y.o. male presents with EMS after being found in his car unresponsive this morning. EMS found the patient have 4 mm pupils bilaterally with sluggish response. The patient was not given any Narcan because he was arousable and also because his breathing was normal.  Patient does not report any pain at this time. Says that he did take some pills last night but does not know what he took. Does not report any suicidal or homicidal ideation.  Patient has a history of substance abuse and was seen for cocaine use this past January 2018.   No past medical history on file.  Patient Active Problem List   Diagnosis Date Noted  . Status post laparoscopic appendectomy 08/05/2016  . Pain following surgery or procedure   . Atrial fibrillation with rapid ventricular response [facility])     Past Surgical History:  Procedure Laterality Date  . LAPAROSCOPIC APPENDECTOMY N/A 07/19/2016   Procedure: APPENDECTOMY LAPAROSCOPIC;  Surgeon: [name], MD;  Location: [facility] ORS;  Service: General;  Laterality: N/A;    Prior to Admission medications   Medication Sig Start Date End Date Taking? Authorizing Provider  alprazolam Prudy Feeler) 2 MG tablet Take 2 mg by mouth 4 (four) times daily.    [provider]  HYDROmorphone (DILAUDID) 2 MG tablet Take 1 tablet (2 mg total) by mouth every 12 (twelve) hours as needed for severe pain. 07/21/16 07/21/17  Minna Antis, MD  ibuprofen (ADVIL,MOTRIN) 600 MG tablet Take 1 tablet (600 mg total) by mouth every 6 (six) hours as needed. 07/21/16   Minna Antis, MD  naproxen (NAPROSYN) 500 MG tablet Take 1 tablet (500  mg total) by mouth 2 (two) times daily with a meal. 08/10/16 08/10/17  Willy Eddy, MD  oxyCODONE (OXY IR/ROXICODONE) 5 MG immediate release tablet Take 1-2 tablets (5-10 mg total) by mouth every 4 (four) hours as needed for moderate pain or severe pain. 07/20/16   [name], MD  traMADol (ULTRAM) 50 MG tablet Take 2 tablets (100 mg total) by mouth every 6 (six) hours as needed. 08/10/16 08/10/17  Willy Eddy, MD    Allergies Penicillins  No family history on file.  Social History Social History  Substance Use Topics  . Smoking status: Current Some Day Smoker  . Smokeless tobacco: Never Used  . Alcohol use Yes    Review of Systems  Constitutional: No fever/chills Eyes: No visual changes. ENT: No sore throat. Cardiovascular: Denies chest pain. Respiratory: Denies shortness of breath. Gastrointestinal: No abdominal pain.  No nausea, no vomiting.  No diarrhea.  No constipation. Genitourinary: Negative for dysuria. Musculoskeletal: Negative for back pain. Skin: Negative for rash. Neurological: Negative for headaches, focal weakness or numbness.   ____________________________________________   PHYSICAL EXAM:  VITAL SIGNS: ED Triage Vitals  Enc Vitals Group     BP      Pulse      Resp      Temp      Temp src      SpO2      Weight      Height      Head Circumference      Peak Flow  Pain Score      Pain Loc      Pain Edu?      Excl. in GC?     Constitutional: Initially with eyes closed but arousable to light touch. Alert and appears intoxicated but in no acute distress. Eyes: Conjunctivae are normal.  3 mm and bilaterally reactive pupils. Head: Atraumatic. Nose: No congestion/rhinnorhea. Mouth/Throat: Mucous membranes are moist.  Neck: No stridor.   Cardiovascular: Normal rate, regular rhythm. Grossly normal heart sounds.  Respiratory: Normal respiratory effort.  No retractions. Lungs CTAB. Gastrointestinal: Soft and nontender. No  distention.  Musculoskeletal: No lower extremity tenderness nor edema.  No joint effusions. Neurologic:  Normal speech and language. No gross focal neurologic deficits are appreciated. Skin:  Skin is warm, dry and intact. No rash noted.  No track marks to the bilateral ACs or upper extremities. No track marks to the lower extremities either.   ____________________________________________   LABS (all labs ordered are listed, but only abnormal results are displayed)  Labs Reviewed - No data to display ____________________________________________  EKG  ED ECG REPORT I, Hilda Wexler,  Teena Iraniavid M, the attending physician, personally viewed and interpreted this ECG.   Date: 03/09/2017  EKG Time: 0805  Rate: 90  Rhythm: normal sinus rhythm  Axis: normal  Intervals:none  ST&T Change: No ST segment elevation or depression. No abnormal T-wave inversion.  ____________________________________________  RADIOLOGY   ____________________________________________   PROCEDURES  Procedure(s) performed:   Procedures  Critical Care performed:   ____________________________________________   INITIAL IMPRESSION / ASSESSMENT AND PLAN / ED COURSE  Pertinent labs & imaging results that were available during my care of the patient were reviewed by me and considered in my medical decision making (see chart for details).    Clinical Course as of Mar 09 1530  Sat Mar 09, 2017  1148 Patient is still somnolent but arousable. Admitted to one of our medical techs that he had taken Xanax last night.  [DS]  1412 Patient now more awake and alert but still needing to be stimulated in order to wake up. He now admits to taking "one Xanax bar." Says that this medication is not prescribed to him. His mother is also now the bedside.   [DS]    Clinical Course User Index [DS] Myrna BlazerSchaevitz, Kasin Tonkinson Matthew, MD    ----------------------------------------- 3:31 PM on  03/09/2017 -----------------------------------------  Patient continues to be more awake and alert and conversive when aroused. He was awake and conversant long enough in order to say that he was not having any suicidal intent. However, quickly falls back to sleep. We'll need further observation. Signed out to Dr. Don PerkingVeronese.  ____________________________________________   FINAL CLINICAL IMPRESSION(S) / ED DIAGNOSES  Benzodiazepine overdose.    NEW MEDICATIONS STARTED DURING THIS VISIT:  New Prescriptions   No medications on file     Note:  This document was prepared using Dragon voice recognition software and may include unintentional dictation errors.     Myrna BlazerSchaevitz, Emylie Amster Matthew, MD 03/09/17 [id number]

## 2017-03-09 NOTE — ED Notes (Addendum)
Attempted to in an out cath however pt became very combative. He is given one more opportunity to urinate into urinal. However pt is falling right back to sleep after each attempted to communicated with him.  Lm edt

## 2017-03-09 NOTE — ED Notes (Signed)
Parents at bedside at this time.

## 2017-03-09 NOTE — ED Notes (Signed)
Pt asked for urine sample at this time, pt refuses in and out cath, states he will give a urine sample " in a minute"

## 2017-03-09 NOTE — ED Notes (Addendum)
Pt would not urinate and would not allow a catheter.  MD notified.
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
## 2017-03-09 NOTE — ED Notes (Signed)
Pt discharged with mother and father at this time. Pt A&Ox4, speech clear.

## 2017-03-09 NOTE — ED Notes (Signed)
Sandwich tray given at this time 

## 2017-03-09 NOTE — ED Notes (Signed)
This RN has informed pt that able for discharge he must provide a urine sample and have a safe ride home per MD Don PerkingVeronese. Pt friend on phone at this time talking to pt.

## 2017-03-09 NOTE — ED Notes (Signed)
Pt was also asked what drugs he used last night or this morning and he said "xanax".  Lm edt

## 2017-04-08 ENCOUNTER — Emergency Department: Payer: Medicaid Other

## 2017-04-08 ENCOUNTER — Encounter: Payer: Self-pay | Admitting: Emergency Medicine

## 2017-04-08 ENCOUNTER — Emergency Department
Admission: EM | Admit: 2017-04-08 | Discharge: 2017-04-08 | Disposition: A | Discharge status: Home / Self Care | Payer: Medicaid Other | Attending: Emergency Medicine | Admitting: Emergency Medicine

## 2017-04-08 DIAGNOSIS — S46912A Strain of unspecified muscle, fascia and tendon at shoulder and upper arm level, left arm, initial encounter: Secondary | ICD-10-CM | POA: Diagnosis not present

## 2017-04-08 DIAGNOSIS — F17200 Nicotine dependence, unspecified, uncomplicated: Secondary | ICD-10-CM | POA: Diagnosis not present

## 2017-04-08 DIAGNOSIS — Y92410 Unspecified street and highway as the place of occurrence of the external cause: Secondary | ICD-10-CM | POA: Insufficient documentation

## 2017-04-08 DIAGNOSIS — Y9389 Activity, other specified: Secondary | ICD-10-CM | POA: Insufficient documentation

## 2017-04-08 DIAGNOSIS — S60852A Superficial foreign body of left wrist, initial encounter: Secondary | ICD-10-CM | POA: Insufficient documentation

## 2017-04-08 DIAGNOSIS — S60212A Contusion of left wrist, initial encounter: Secondary | ICD-10-CM | POA: Diagnosis not present

## 2017-04-08 DIAGNOSIS — S161XXA Strain of muscle, fascia and tendon at neck level, initial encounter: Secondary | ICD-10-CM | POA: Insufficient documentation

## 2017-04-08 DIAGNOSIS — S199XXA Unspecified injury of neck, initial encounter: Secondary | ICD-10-CM | POA: Diagnosis present

## 2017-04-08 DIAGNOSIS — Y999 Unspecified external cause status: Secondary | ICD-10-CM | POA: Insufficient documentation

## 2017-04-08 MED ORDER — IBUPROFEN 600 MG PO TABS: 600.0000 mg | ORAL_TABLET | Freq: Three times a day (TID) | ORAL | 0 refills | Status: DC | PRN

## 2017-04-08 MED ORDER — OXYCODONE-ACETAMINOPHEN 5-325 MG PO TABS: 1.0000 | ORAL_TABLET | ORAL | 0 refills | Status: DC | PRN

## 2017-04-08 MED ORDER — OXYCODONE-ACETAMINOPHEN 5-325 MG PO TABS
2.0000 | ORAL_TABLET | Freq: Once | ORAL | Status: AC
  Administered 2017-04-08: 2 via ORAL
  Filled 2017-04-08: qty 2

## 2017-04-08 MED ORDER — HYDROMORPHONE HCL 1 MG/ML IJ SOLN
1.0000 mg | Freq: Once | INTRAMUSCULAR | Status: AC
  Administered 2017-04-08: 1 mg via INTRAMUSCULAR
  Filled 2017-04-08: qty 1

## 2017-04-08 NOTE — ED Notes (Signed)
Pts wife called and requested to talk to pt. Pt verbalized she does not want to talk to wife at this time.

## 2017-04-08 NOTE — ED Notes (Signed)
Sling applied and pt ambulatory at time of discharge.

## 2017-04-08 NOTE — Discharge Instructions (Signed)
Follow-up with your primary care doctor or [facility] if any continued problems. Watch abrasions for any signs of infection. Clean areas daily with mild soap and water. Watch the area on the left wrist especially since glass was removed from that area. Wear sling for support. Be  aware that you will be extremely sore for the next 4-5 days. Take Percocet as needed for severe pain. Ibuprofen 600 mg 3 times a day with food.

## 2017-04-08 NOTE — ED Notes (Signed)
Gave patient water 
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
## 2017-05-13 ENCOUNTER — Emergency Department
Admission: EM | Admit: 2017-05-13 | Discharge: 2017-05-13 | Disposition: A | Discharge status: Home / Self Care | Payer: Medicaid Other | Attending: Emergency Medicine | Admitting: Emergency Medicine

## 2017-05-13 DIAGNOSIS — F17210 Nicotine dependence, cigarettes, uncomplicated: Secondary | ICD-10-CM | POA: Insufficient documentation

## 2017-05-13 DIAGNOSIS — L03115 Cellulitis of right lower limb: Secondary | ICD-10-CM | POA: Diagnosis not present

## 2017-05-13 DIAGNOSIS — M79651 Pain in right thigh: Secondary | ICD-10-CM | POA: Diagnosis present

## 2017-05-13 DIAGNOSIS — Z79899 Other long term (current) drug therapy: Secondary | ICD-10-CM | POA: Insufficient documentation

## 2017-05-13 DIAGNOSIS — L0390 Cellulitis, unspecified: Secondary | ICD-10-CM

## 2017-05-13 LAB — CBC WITH DIFFERENTIAL/PLATELET
BASOS PCT: 0 %
Basophils Absolute: 0 10*3/uL (ref 0–0.1)
EOS ABS: 0 10*3/uL (ref 0–0.7)
EOS PCT: 0 %
HCT: 43.3 % (ref 40.0–52.0)
Hemoglobin: 14.6 g/dL (ref 13.0–18.0)
LYMPHS ABS: 0.5 10*3/uL — AB (ref 1.0–3.6)
Lymphocytes Relative: 3 %
MCH: 31.5 pg (ref 26.0–34.0)
MCHC: 33.8 g/dL (ref 32.0–36.0)
MCV: 93.1 fL (ref 80.0–100.0)
MONOS PCT: 7 %
Monocytes Absolute: 1.1 10*3/uL — ABNORMAL HIGH (ref 0.2–1.0)
NEUTROS PCT: 90 %
Neutro Abs: 15.1 10*3/uL — ABNORMAL HIGH (ref 1.4–6.5)
PLATELETS: 259 10*3/uL (ref 150–440)
RBC: 4.65 MIL/uL (ref 4.40–5.90)
RDW: 12.3 % (ref 11.5–14.5)
WBC: 16.8 10*3/uL — ABNORMAL HIGH (ref 3.8–10.6)

## 2017-05-13 LAB — COMPREHENSIVE METABOLIC PANEL
ALBUMIN: 4.1 g/dL (ref 3.5–5.0)
ALT: 16 U/L — AB (ref 17–63)
AST: 20 U/L (ref 15–41)
Alkaline Phosphatase: 97 U/L (ref 38–126)
Anion gap: 7 (ref 5–15)
BUN: 21 mg/dL — AB (ref 6–20)
CALCIUM: 9.4 mg/dL (ref 8.9–10.3)
CHLORIDE: 104 mmol/L (ref 101–111)
CO2: 29 mmol/L (ref 22–32)
CREATININE: 1.12 mg/dL (ref 0.61–1.24)
Glucose, Bld: 108 mg/dL — ABNORMAL HIGH (ref 65–99)
Potassium: 4 mmol/L (ref 3.5–5.1)
SODIUM: 140 mmol/L (ref 135–145)
Total Bilirubin: 1 mg/dL (ref 0.3–1.2)
Total Protein: 7.2 g/dL (ref 6.5–8.1)

## 2017-05-13 MED ORDER — CLINDAMYCIN PHOSPHATE 600 MG/50ML IV SOLN
600.0000 mg | Freq: Once | INTRAVENOUS | Status: AC
  Administered 2017-05-13: 600 mg via INTRAVENOUS
  Filled 2017-05-13 (×2): qty 50

## 2017-05-13 MED ORDER — OXYCODONE-ACETAMINOPHEN 5-325 MG PO TABS
ORAL_TABLET | ORAL | Status: AC
  Administered 2017-05-13: 1 via ORAL
  Filled 2017-05-13: qty 1

## 2017-05-13 MED ORDER — PENTAFLUOROPROP-TETRAFLUOROETH EX AERO
INHALATION_SPRAY | CUTANEOUS | Status: DC | PRN
  Filled 2017-05-13: qty 30

## 2017-05-13 MED ORDER — FENTANYL CITRATE (PF) 100 MCG/2ML IJ SOLN
50.0000 ug | Freq: Once | INTRAMUSCULAR | Status: AC
  Administered 2017-05-13: 50 ug via INTRAVENOUS
  Filled 2017-05-13: qty 2

## 2017-05-13 MED ORDER — OXYCODONE-ACETAMINOPHEN 5-325 MG PO TABS
1.0000 | ORAL_TABLET | Freq: Four times a day (QID) | ORAL | 0 refills | Status: DC | PRN
Start: 2017-05-13 — End: 2017-05-14

## 2017-05-13 MED ORDER — OXYCODONE-ACETAMINOPHEN 5-325 MG PO TABS
1.0000 | ORAL_TABLET | Freq: Once | ORAL | Status: AC
  Administered 2017-05-13: 1 via ORAL

## 2017-05-13 MED ORDER — LIDOCAINE HCL (PF) 1 % IJ SOLN
5.0000 mL | Freq: Once | INTRAMUSCULAR | Status: AC
  Administered 2017-05-13: 5 mL
  Filled 2017-05-13: qty 5

## 2017-05-13 MED ORDER — SODIUM CHLORIDE 0.9 % IV BOLUS (SEPSIS)
1000.0000 mL | Freq: Once | INTRAVENOUS | Status: AC
  Administered 2017-05-13: 1000 mL via INTRAVENOUS

## 2017-05-13 NOTE — ED Provider Notes (Signed)
[facility] Emergency Department Provider Note  ____________________________________________   I have reviewed the triage vital signs and the nursing notes.   HISTORY  Chief Complaint Cellulitis    HPI Alex Schwager. is a 36 y.o. male who states that over the last 3 days or so he's had gradually increasing redness and pain to his right thigh. He went to [facility] but they forgot to give him antibiotics; they did call them in today and he has taken one dose of bactrim. Denies ivda. No fever no chills.  Pain started with a 'pimple' that he popped on his thigh and has worsened with diffuse swellign. He is adamant that he does not want to be admitted.     History reviewed. No pertinent past medical history.  Patient Active Problem List   Diagnosis Date Noted  . Status post laparoscopic appendectomy 08/05/2016  . Pain following surgery or procedure   . Atrial fibrillation with rapid ventricular response [facility])     Past Surgical History:  Procedure Laterality Date  . LAPAROSCOPIC APPENDECTOMY N/A 07/19/2016   Procedure: APPENDECTOMY LAPAROSCOPIC;  Surgeon: [name], MD;  Location: [facility] ORS;  Service: General;  Laterality: N/A;    Prior to Admission medications   Medication Sig Start Date End Date Taking? Authorizing Provider  acetaminophen (TYLENOL) 500 MG tablet Take 500 mg by mouth every 4 (four) hours as needed.   Yes [provider]  diazepam (VALIUM) 5 MG tablet Take 1 tablet by mouth as needed. 05/01/17  Yes [provider]  ibuprofen (ADVIL,MOTRIN) 600 MG tablet Take 1 tablet (600 mg total) by mouth every 8 (eight) hours as needed. 04/08/17  Yes Tommi Rumps, PA-C  phentermine 37.5 MG capsule Take 37.5 mg by mouth every morning.   Yes [provider]  alprazolam Prudy Feeler) 2 MG tablet Take 2 mg by mouth 4 (four) times daily.    [provider]  cyclobenzaprine (FLEXERIL) 10 MG tablet Take 10 mg by mouth 3 (three)  times daily as needed. 04/23/17   [provider]  HYDROcodone-acetaminophen (NORCO/VICODIN) 5-325 MG tablet Take 1 tablet by mouth 4 (four) times daily as needed. 04/23/17   [provider]  oxyCODONE (OXY IR/ROXICODONE) 5 MG immediate release tablet Take 1 tablet by mouth every 4 (four) hours as needed. 05/02/17   [provider]  oxyCODONE-acetaminophen (PERCOCET) 5-325 MG tablet Take 1 tablet by mouth every 4 (four) hours as needed for severe pain. Patient not taking: Reported on 05/13/2017 04/08/17   Tommi Rumps, PA-C  sulfamethoxazole-trimethoprim (BACTRIM DS,SEPTRA DS) 800-160 MG tablet Take 2 tablets by mouth 2 (two) times daily. 05/13/17 05/20/17  [provider]    Allergies Penicillins and Hydrocodone  History reviewed. No pertinent family history.  Social History Social History  Substance Use Topics  . Smoking status: Current Some Day Smoker  . Smokeless tobacco: Never Used  . Alcohol use Yes    Review of Systems Constitutional: No fever/chills Eyes: No visual changes. ENT: No sore throat. No stiff neck no neck pain Cardiovascular: Denies chest pain. Respiratory: Denies shortness of breath. Gastrointestinal:   no vomiting.  No diarrhea.  No constipation. Genitourinary: Negative for dysuria. Musculoskeletal: Negative lower extremity swelling Skin: see hpi Neurological: Negative for severe headaches, focal weakness or numbness.   ____________________________________________   PHYSICAL EXAM:  VITAL SIGNS: ED Triage Vitals [05/13/17 1407]  Enc Vitals Group     BP (!) 144/93     Pulse Rate Marland Kitchen)  110     Resp 18     Temp 97.9 F (36.6 C)     Temp Source Oral     SpO2 100 %     Weight 165 lb (74.8 kg)     Height 6\' 1"  (1.854 m)     Head Circumference      Peak Flow      Pain Score 9     Pain Loc      Pain Edu?      Excl. in GC?     Constitutional: Alert and oriented. Well appearing and in no acute distress. Eyes:  Conjunctivae are normal Head: Atraumatic HEENT: No congestion/rhinnorhea. Mucous membranes are moist.  Oropharynx non-erythematous Neck:   Nontender with no meningismus, no masses, no stridor Cardiovascular: Normal rate, regular rhythm. Grossly normal heart sounds.  Good peripheral circulation. Respiratory: Normal respiratory effort.  No retractions. Lungs CTAB. Abdominal: Soft and nontender. No distention. No guarding no rebound Back:  There is no focal tenderness or step off.  there is no midline tenderness there are no lesions noted. there is no CVA tenderness Musculoskeletal: No lower extremity tenderness, no upper extremity tenderness. No joint effusions, no DVT signs strong distal pulses no edema Neurologic:  Normal speech and language. No gross focal neurologic deficits are appreciated.  Skin:  Skin is warm, dry and intact there is an area of induration around what appears to be an abscess with induration but minimal fluctuance in the R thigh. The erythema and warmth is diffuse to the L anterior thigh. Above the knee. Does not involve the knee, there are no streaks from the area. He did mark it this morning and the redness is largely within the bounds of his markings.   Psychiatric: Mood and affect are normal. Speech and behavior are normal.  ____________________________________________   LABS (all labs ordered are listed, but only abnormal results are displayed)  Labs Reviewed  COMPREHENSIVE METABOLIC PANEL - Abnormal; Notable for the following:       Result Value   Glucose, Bld 108 (*)    BUN 21 (*)    ALT 16 (*)    All other components within normal limits  CBC WITH DIFFERENTIAL/PLATELET - Abnormal; Notable for the following:    WBC 16.8 (*)    Neutro Abs 15.1 (*)    Lymphs Abs 0.5 (*)    Monocytes Absolute 1.1 (*)    All other components within normal limits  AEROBIC CULTURE (SUPERFICIAL SPECIMEN)  URINE DRUG SCREEN, QUALITATIVE ([facility] ONLY)    ____________________________________________  EKG  I personally interpreted any EKGs ordered by me or triage  ____________________________________________  RADIOLOGY  I reviewed any imaging ordered by me or triage that were performed during my shift and, if possible, patient and/or family made aware of any abnormal findings. ____________________________________________   PROCEDURES  Procedure(s) performed: I and D, informed consent obtained, time out performed.INCISION AND DRAINAGE Performed by: Jeanmarie Plant Consent: Verbal consent obtained. Risks and benefits: risks, benefits and alternatives were discussed Type: abscess  Body area: thigh    Anesthesia: local infiltration  Incision was made with a scalpel.  Local anesthetic: lidocaine 2% w/o epinephrine  Anesthetic total: 5 ml  Complexity: complex Blunt dissection to break up loculations  Drainage: purulent  Drainage amount: 6 ml  Packing material: 1/4 in iodoform gauze  Patient tolerance: Patient tolerated the procedure well with no immediate complications.    Procedures  Critical Care performed: None  ____________________________________________   INITIAL IMPRESSION /  ASSESSMENT AND PLAN / ED COURSE  Pertinent labs & imaging results that were available during my care of the patient were reviewed by me and considered in my medical decision making (see chart for details).  Pt with cellulitis and likely abscess. I did an I&D with some success and packing is placed. I did strongly advised admission. However patient refuses. He understands r/b/a of refusal and of admission. He has bactrim at home.  He will take it. I have given him clinda. He says he likely would not fill a clinda rx having already bought the bactrim.  I have instructed him to return to the emergency department for a recheck tomorrow.  He is not allergic to percocet. I have advised him not to drive on pain meds. Return precautions and   F/u given and understood.     ____________________________________________   FINAL CLINICAL IMPRESSION(S) / ED DIAGNOSES  Final diagnoses:  Cellulitis, unspecified cellulitis site      This chart was dictated using voice recognition software.  Despite best efforts to proofread,  errors can occur which can change meaning.       Jeanmarie Plant, MD 05/13/17 [phone]

## 2017-05-13 NOTE — ED Triage Notes (Signed)
Pt states he has an abscess to R thigh. States noticed yesterday. States came to [facility]  yesterday and was told he was going to be given an antibiotic but none was given. NO IV or IM or PO antibiotic. Pt has circled the redness from this AM. Redness has spread slightly outside of area. Has two areas that are raised, black spot in middle, redness and swelling noted to both areas.

## 2017-05-13 NOTE — ED Notes (Signed)
Patient was discharged in error.

## 2017-05-13 NOTE — ED Notes (Signed)
MD at bedside reviewing patient's condition and need for admission. At this time, patient is refusing admission at this time and expressing his desire to go home.

## 2017-05-13 NOTE — Discharge Instructions (Signed)
Continue taking the bactrim. If you have fever, spreading redness, increased pain or other new or worrisome symptoms return to the emergency department. Come back tomorrow for recheck. We have advised admission but you would prefer to go home, this does limit our ability to take care of you and places more responsibility on you to be sure that you return.  Do not drive or drink on percocet.

## 2017-05-13 NOTE — ED Notes (Signed)
Redness around abscess marked with skin marker. Patient instructed to keep a close eye on redness to ensure it is not worsening.

## 2017-05-14 ENCOUNTER — Encounter: Payer: Self-pay | Admitting: Emergency Medicine

## 2017-05-14 ENCOUNTER — Emergency Department
Admission: EM | Admit: 2017-05-14 | Discharge: 2017-05-14 | Disposition: A | Discharge status: Home / Self Care | Payer: Medicaid Other | Source: Home / Self Care | Attending: Emergency Medicine | Admitting: Emergency Medicine

## 2017-05-14 DIAGNOSIS — L03115 Cellulitis of right lower limb: Secondary | ICD-10-CM | POA: Insufficient documentation

## 2017-05-14 DIAGNOSIS — F17200 Nicotine dependence, unspecified, uncomplicated: Secondary | ICD-10-CM | POA: Insufficient documentation

## 2017-05-14 DIAGNOSIS — Z09 Encounter for follow-up examination after completed treatment for conditions other than malignant neoplasm: Secondary | ICD-10-CM

## 2017-05-14 DIAGNOSIS — Z79899 Other long term (current) drug therapy: Secondary | ICD-10-CM

## 2017-05-14 MED ORDER — MORPHINE SULFATE (PF) 2 MG/ML IV SOLN
2.0000 mg | Freq: Once | INTRAVENOUS | Status: AC
  Administered 2017-05-14: 2 mg via INTRAMUSCULAR
  Filled 2017-05-14: qty 1

## 2017-05-14 MED ORDER — LIDOCAINE HCL (PF) 1 % IJ SOLN
INTRAMUSCULAR | Status: AC
  Filled 2017-05-14: qty 10

## 2017-05-14 MED ORDER — CEPHALEXIN 500 MG PO CAPS: 500.0000 mg | ORAL_CAPSULE | Freq: Four times a day (QID) | ORAL | 0 refills | Status: DC

## 2017-05-14 MED ORDER — LIDOCAINE HCL (PF) 1 % IJ SOLN
10.0000 mL | Freq: Once | INTRAMUSCULAR | Status: AC
Start: 2017-05-14 — End: 2017-05-14
  Administered 2017-05-14: 10 mL

## 2017-05-14 MED ORDER — OXYCODONE-ACETAMINOPHEN 5-325 MG PO TABS: 1.0000 | ORAL_TABLET | Freq: Four times a day (QID) | ORAL | 0 refills | Status: DC | PRN

## 2017-05-14 MED ORDER — OXYCODONE-ACETAMINOPHEN 5-325 MG PO TABS
1.0000 | ORAL_TABLET | Freq: Once | ORAL | Status: AC
  Administered 2017-05-14: 1 via ORAL
  Filled 2017-05-14: qty 1

## 2017-05-14 NOTE — Discharge Instructions (Signed)
Return tomorrow for recheck of your leg. Continue taking Bactrim twice a day. Begin Taking Keflex 500 mg 4 times a day. Discontinue taking if any rash and immediately begin taking Benadryl. Continue taking Percocet as needed for pain. Also call Dr. Talmage Coin office tomorrow to see if he is able to see you in the office for evaluation of your abscess. Please consider hospitalization if your leg is not improving.

## 2017-05-14 NOTE — ED Provider Notes (Signed)
Physician's assistant Levada Schilling asked me to come evaluate the patient. Briefly the patient is a 36 year old man with an abscess and cellulitis to his right anterior thigh. He was seen in our emergency department yesterday by Dr. Alphonzo Lemmings performed an incision and drainage and packed the wound. The patient was alert he taking Bactrim at home and he refused further antibiotics and refused inpatient admission. He returns today with persistent pain. He said at home he expressed a large amount. Material. He denies fevers or chills.  On evaluation the patient has cellulitis that appears beyond the demarcated area yesterday from Dr. Alphonzo Lemmings in some areas and improved in other areas. No bulla blisters sloughing or other signs of necrotizing soft tissue infection. I evaluated the patient with bedside ultrasound and it appears that in addition to cobblestoning there is remaining abscess underneath the wound.  My recommendation is repeat incision and drainage with a larger initial opening incision going caudally with aggressive breaking up with the loculations. I would also add on Keflex 500 mg by mouth 4 times a day in addition to his current Bactrim.  Medical screening examination/treatment/procedure(s) were conducted as a shared visit with non-physician practitioner(s) and myself.  I personally evaluated the patient during the encounter.     Merrily Brittle, MD 05/14/17 [phone]

## 2017-05-14 NOTE — ED Triage Notes (Signed)
Pt to ed with c/o abscess to right upper leg.  Pt states was seen here yesterday for same and told to come back for recheck today.

## 2017-05-14 NOTE — ED Provider Notes (Signed)
[facility] Emergency Department Provider Note  ____________________________________________   None    (approximate)  I have reviewed the triage vital signs and the nursing notes.   HISTORY  Chief Complaint Wound Check   HPI Alex Wilkins. is a 36 y.o. male is here for recheck of his cellulitis to his right thigh. He was seen in the emergency room yesterday by Dr. Alphonzo Lemmings. Area was I&D and patient began  taking Bactrim DS. Patient states that at approximately 4 AM the pain was so bad that he took another pain pill and began squeezing his thigh hoping to get more pus out of it. He denies any fever or chills at home. In looking over Dr. Iline Oven notes patient was adamantly against being hospitalized yesterday. Patient expresses the same feelings today. He rates pain as 7 out of 10.   History reviewed. No pertinent past medical history.  Patient Active Problem List   Diagnosis Date Noted  . Status post laparoscopic appendectomy 08/05/2016  . Pain following surgery or procedure   . Atrial fibrillation with rapid ventricular response [facility])     Past Surgical History:  Procedure Laterality Date  . LAPAROSCOPIC APPENDECTOMY N/A 07/19/2016   Procedure: APPENDECTOMY LAPAROSCOPIC;  Surgeon: [name], MD;  Location: [facility] ORS;  Service: General;  Laterality: N/A;    Prior to Admission medications   Medication Sig Start Date End Date Taking? Authorizing Provider  acetaminophen (TYLENOL) 500 MG tablet Take 500 mg by mouth every 4 (four) hours as needed.    [provider]  alprazolam Prudy Feeler) 2 MG tablet Take 2 mg by mouth 4 (four) times daily.    [provider]  cephALEXin (KEFLEX) 500 MG capsule Take 1 capsule (500 mg total) by mouth 4 (four) times daily. 05/14/17   Tommi Rumps, PA-C  cyclobenzaprine (FLEXERIL) 10 MG tablet Take 10 mg by mouth 3 (three) times daily as needed. 04/23/17   [provider]  diazepam (VALIUM)  5 MG tablet Take 1 tablet by mouth as needed. 05/01/17   [provider]  HYDROcodone-acetaminophen (NORCO/VICODIN) 5-325 MG tablet Take 1 tablet by mouth 4 (four) times daily as needed. 04/23/17   [provider]  oxyCODONE (OXY IR/ROXICODONE) 5 MG immediate release tablet Take 1 tablet by mouth every 4 (four) hours as needed. 05/02/17   [provider]  oxyCODONE-acetaminophen (ROXICET) 5-325 MG tablet Take 1 tablet by mouth every 6 (six) hours as needed. 05/14/17 05/14/18  Tommi Rumps, PA-C  phentermine 37.5 MG capsule Take 37.5 mg by mouth every morning.    [provider]  sulfamethoxazole-trimethoprim (BACTRIM DS,SEPTRA DS) 800-160 MG tablet Take 2 tablets by mouth 2 (two) times daily. 05/13/17 05/20/17  [provider]    Allergies Penicillins and Hydrocodone  No family history on file.  Social History Social History  Substance Use Topics  . Smoking status: Current Some Day Smoker  . Smokeless tobacco: Never Used  . Alcohol use Yes    Review of Systems Constitutional: No fever/chills Cardiovascular: Denies chest pain. Respiratory: Denies shortness of breath. Gastrointestinal:   No nausea, no vomiting.  Musculoskeletal: Right leg pain secondary to abscess. Skin: Positive for abscess/cellulitis right thigh. Neurological: Negative for focal weakness or numbness. ___________________________________________   PHYSICAL EXAM:  VITAL SIGNS: ED Triage Vitals  Enc Vitals Group     BP 05/14/17 1120 (!) 138/96     Pulse Rate 05/14/17 1120 100     Resp 05/14/17 1120 18  Temp 05/14/17 1120 98.2 F (36.8 C)     Temp Source 05/14/17 1120 Oral     SpO2 05/14/17 1120 100 %     Weight 05/14/17 1121 165 lb (74.8 kg)     Height --      Head Circumference --      Peak Flow --      Pain Score 05/14/17 1120 7     Pain Loc --      Pain Edu? --      Excl. in GC? --     Constitutional: Alert and oriented. Well appearing and in no acute  distress. Eyes: Conjunctivae are normal. PERRL. EOMI. Head: Atraumatic. Neck: No stridor.   Cardiovascular: Normal rate, regular rhythm. Grossly normal heart sounds.  Good peripheral circulation. Respiratory: Normal respiratory effort.  No retractions. Lungs CTAB. Musculoskeletal: No gross deformity is noted of joints and patient is able move upper and lower extremities without any difficulty. Neurologic:  Normal speech and language. No gross focal neurologic deficits are appreciated. No gait instability. Skin:  Skin is warm, dry. Examination of the right thigh there is an area that has been opened with packing present. There is no active drainage present. Area is warm and tender to touch. There is a pen marking from yesterday's visit and cellulitis has extended past the mark slightly and concerning.  Psychiatric: Mood and affect are normal. Speech and behavior are normal.  ____________________________________________   LABS (all labs ordered are listed, but only abnormal results are displayed)  Labs Reviewed - No data to display   PROCEDURES  Procedure(s) performed: INCISION AND DRAINAGE Performed by: Tommi Rumps Consent: Verbal consent obtained. Risks and benefits: risks, benefits and alternatives were discussed Type: abscess  Body area: Right thigh anterior aspect  Anesthesia: local infiltration  Incision was made with a scalpel.  Local anesthetic: lidocaine 1 % without epinephrine  Anesthetic total: 4.0 ml  Complexity: complex Blunt dissection to break up loculations  Drainage: purulent  Drainage amount: Minimal   Packing material: 1/4 in iodoform gauze  Patient tolerance: Patient did not have any immediate complications but did not tolerate the procedure well. Patient was given Percocet by mouth prior to procedure but requested IM medication because yesterday's procedure was extremely painful. Patient was given morphine 4 mg IM which he states was not enough.  Initial incision was extended however patient continued to move making it difficult to extend further.     Procedures  Critical Care performed: No  ____________________________________________   INITIAL IMPRESSION / ASSESSMENT AND PLAN / ED COURSE  Pertinent labs & imaging results that were available during my care of the patient were reviewed by me and considered in my medical decision making (see chart for details).  Dr. Lamont Snowball was in to see the patient also because of concern for increased infection and patient's refusal for hospitalization. Bedside ultrasound showed cellulitis with abscess.  Multiple attempts to have the patient consider hospitalization were made however patient states that he does not like hospitals and does not want to be hospitalized. We discussed his returning tomorrow for reevaluation of his leg and if not improving that even if he does not like hospitals he should be hospitalized and a surgical consult for his abscess. Patient will continue taking Bactrim DS and Keflex 500 mg 4 times a day was started. Patient was given a prescription for a limited amount of oxycodone since patient does not recall how many are still left in his bottle. Patient called his  mother who states that his penicillin allergy was when he was small and was a rash. There was no respiratory difficulty involved with his reaction. Patient is aware that if he develops a rash he is to discontinue taking the Keflex and take Benadryl. Patient will return tomorrow. He is also aware that he is not to manipulate his leg dressing   ____________________________________________   FINAL CLINICAL IMPRESSION(S) / ED DIAGNOSES  Final diagnoses:  Encounter for recheck of abscess following incision and drainage  Cellulitis of right lower extremity      NEW MEDICATIONS STARTED DURING THIS VISIT:  Discharge Medication List as of 05/14/2017  2:35 PM    START taking these medications   Details    cephALEXin (KEFLEX) 500 MG capsule Take 1 capsule (500 mg total) by mouth 4 (four) times daily., Starting Tue 05/14/2017, Print         Note:  This document was prepared using Dragon voice recognition software and may include unintentional dictation errors.    Tommi Rumps, PA-C 05/14/17 1710    Merrily Brittle, MD 05/15/17 [phone]

## 2017-05-15 ENCOUNTER — Encounter
Admission: EM | Disposition: A | Discharge status: Home / Self Care | Payer: Self-pay | Source: Home / Self Care | Attending: Internal Medicine

## 2017-05-15 ENCOUNTER — Inpatient Hospital Stay
Admission: EM | Admit: 2017-05-15 | Discharge: 2017-05-16 | DRG: 857 | Disposition: A | Discharge status: Home / Self Care | Payer: Medicaid Other | Attending: Internal Medicine | Admitting: Internal Medicine

## 2017-05-15 ENCOUNTER — Inpatient Hospital Stay: Payer: Medicaid Other | Admitting: Anesthesiology

## 2017-05-15 ENCOUNTER — Encounter: Payer: Self-pay | Admitting: Emergency Medicine

## 2017-05-15 DIAGNOSIS — T814XXA Infection following a procedure, initial encounter: Principal | ICD-10-CM | POA: Diagnosis present

## 2017-05-15 DIAGNOSIS — Z880 Allergy status to penicillin: Secondary | ICD-10-CM

## 2017-05-15 DIAGNOSIS — Y838 Other surgical procedures as the cause of abnormal reaction of the patient, or of later complication, without mention of misadventure at the time of the procedure: Secondary | ICD-10-CM | POA: Diagnosis present

## 2017-05-15 DIAGNOSIS — Z8614 Personal history of Methicillin resistant Staphylococcus aureus infection: Secondary | ICD-10-CM | POA: Diagnosis not present

## 2017-05-15 DIAGNOSIS — L02415 Cutaneous abscess of right lower limb: Secondary | ICD-10-CM | POA: Diagnosis present

## 2017-05-15 DIAGNOSIS — L03115 Cellulitis of right lower limb: Secondary | ICD-10-CM

## 2017-05-15 DIAGNOSIS — F17200 Nicotine dependence, unspecified, uncomplicated: Secondary | ICD-10-CM | POA: Diagnosis present

## 2017-05-15 DIAGNOSIS — Z79899 Other long term (current) drug therapy: Secondary | ICD-10-CM

## 2017-05-15 DIAGNOSIS — F419 Anxiety disorder, unspecified: Secondary | ICD-10-CM | POA: Diagnosis present

## 2017-05-15 DIAGNOSIS — K219 Gastro-esophageal reflux disease without esophagitis: Secondary | ICD-10-CM | POA: Diagnosis present

## 2017-05-15 DIAGNOSIS — Z885 Allergy status to narcotic agent status: Secondary | ICD-10-CM

## 2017-05-15 DIAGNOSIS — I10 Essential (primary) hypertension: Secondary | ICD-10-CM | POA: Diagnosis present

## 2017-05-15 HISTORY — PX: INCISION AND DRAINAGE ABSCESS: SHX5864

## 2017-05-15 HISTORY — DX: Cellulitis of right lower limb: L03.115

## 2017-05-15 HISTORY — DX: Cutaneous abscess of right lower limb: L02.415

## 2017-05-15 LAB — COMPREHENSIVE METABOLIC PANEL
ALBUMIN: 3.7 g/dL (ref 3.5–5.0)
ALK PHOS: 83 U/L (ref 38–126)
ALT: 24 U/L (ref 17–63)
ANION GAP: 8 (ref 5–15)
AST: 21 U/L (ref 15–41)
BUN: 8 mg/dL (ref 6–20)
CALCIUM: 9.4 mg/dL (ref 8.9–10.3)
CO2: 28 mmol/L (ref 22–32)
Chloride: 101 mmol/L (ref 101–111)
Creatinine, Ser: 0.89 mg/dL (ref 0.61–1.24)
GFR calc Af Amer: >60 mL/min (ref >60)
GFR calc non Af Amer: >60 mL/min (ref >60)
GLUCOSE: 112 mg/dL — AB (ref 65–99)
POTASSIUM: 4 mmol/L (ref 3.5–5.1)
SODIUM: 137 mmol/L (ref 135–145)
Total Bilirubin: 0.6 mg/dL (ref 0.3–1.2)
Total Protein: 7.5 g/dL (ref 6.5–8.1)

## 2017-05-15 LAB — CREATININE, SERUM: Creatinine, Ser: 0.91 mg/dL (ref 0.61–1.24)

## 2017-05-15 LAB — URINE DRUG SCREEN, QUALITATIVE (ARMC ONLY)
AMPHETAMINES, UR SCREEN: POSITIVE — AB
BARBITURATES, UR SCREEN: NOT DETECTED
BENZODIAZEPINE, UR SCRN: NOT DETECTED
COCAINE METABOLITE, UR ~~LOC~~: NOT DETECTED
Cannabinoid 50 Ng, Ur ~~LOC~~: NOT DETECTED
MDMA (Ecstasy)Ur Screen: NOT DETECTED
METHADONE SCREEN, URINE: NOT DETECTED
Opiate, Ur Screen: POSITIVE — AB
Phencyclidine (PCP) Ur S: NOT DETECTED
TRICYCLIC, UR SCREEN: NOT DETECTED

## 2017-05-15 LAB — CBC
HEMATOCRIT: 39.7 % — AB (ref 40.0–52.0)
HEMOGLOBIN: 13.3 g/dL (ref 13.0–18.0)
MCH: 31.2 pg (ref 26.0–34.0)
MCHC: 33.5 g/dL (ref 32.0–36.0)
MCV: 93.1 fL (ref 80.0–100.0)
Platelets: 234 10*3/uL (ref 150–440)
RBC: 4.26 MIL/uL — ABNORMAL LOW (ref 4.40–5.90)
RDW: 12.7 % (ref 11.5–14.5)
WBC: 12.2 10*3/uL — AB (ref 3.8–10.6)

## 2017-05-15 LAB — CBC WITH DIFFERENTIAL/PLATELET
Basophils Absolute: 0 10*3/uL (ref 0–0.1)
Basophils Relative: 0 %
Eosinophils Absolute: 0.1 10*3/uL (ref 0–0.7)
Eosinophils Relative: 1 %
HEMATOCRIT: 39.8 % — AB (ref 40.0–52.0)
Hemoglobin: 13.7 g/dL (ref 13.0–18.0)
LYMPHS PCT: 6 %
Lymphs Abs: 0.9 10*3/uL — ABNORMAL LOW (ref 1.0–3.6)
MCH: 31.8 pg (ref 26.0–34.0)
MCHC: 34.3 g/dL (ref 32.0–36.0)
MCV: 92.6 fL (ref 80.0–100.0)
MONO ABS: 1.2 10*3/uL — AB (ref 0.2–1.0)
MONOS PCT: 8 %
NEUTROS ABS: 12.1 10*3/uL — AB (ref 1.4–6.5)
Neutrophils Relative %: 85 %
Platelets: 248 10*3/uL (ref 150–440)
RBC: 4.3 MIL/uL — ABNORMAL LOW (ref 4.40–5.90)
RDW: 12.5 % (ref 11.5–14.5)
WBC: 14.3 10*3/uL — ABNORMAL HIGH (ref 3.8–10.6)

## 2017-05-15 LAB — URINALYSIS, COMPLETE (UACMP) WITH MICROSCOPIC
BACTERIA UA: NONE SEEN
BILIRUBIN URINE: NEGATIVE
Glucose, UA: NEGATIVE mg/dL
Hgb urine dipstick: NEGATIVE
Ketones, ur: NEGATIVE mg/dL
Leukocytes, UA: NEGATIVE
Nitrite: NEGATIVE
Protein, ur: NEGATIVE mg/dL
RBC / HPF: NONE SEEN RBC/hpf (ref 0–5)
SPECIFIC GRAVITY, URINE: 1.014 (ref 1.005–1.030)
pH: 6 (ref 5.0–8.0)

## 2017-05-15 SURGERY — INCISION AND DRAINAGE, ABSCESS
Anesthesia: General | Site: Thigh | Laterality: Right | Wound class: Dirty or Infected

## 2017-05-15 MED ORDER — FENTANYL CITRATE (PF) 100 MCG/2ML IJ SOLN
INTRAMUSCULAR | Status: AC
  Filled 2017-05-15: qty 2

## 2017-05-15 MED ORDER — PROPOFOL 10 MG/ML IV BOLUS
INTRAVENOUS | Status: AC
Start: 2017-05-15 — End: 2017-05-15
  Filled 2017-05-15: qty 20

## 2017-05-15 MED ORDER — SUCCINYLCHOLINE CHLORIDE 20 MG/ML IJ SOLN
INTRAMUSCULAR | Status: AC
  Filled 2017-05-15: qty 1

## 2017-05-15 MED ORDER — ONDANSETRON HCL 4 MG/2ML IJ SOLN: 4.0000 mg | Freq: Four times a day (QID) | INTRAMUSCULAR | Status: DC | PRN

## 2017-05-15 MED ORDER — BISACODYL 5 MG PO TBEC
5.0000 mg | DELAYED_RELEASE_TABLET | Freq: Every day | ORAL | Status: DC | PRN
  Filled 2017-05-15: qty 1

## 2017-05-15 MED ORDER — MIDAZOLAM HCL 2 MG/2ML IJ SOLN
INTRAMUSCULAR | Status: DC | PRN
  Administered 2017-05-15: 2 mg via INTRAVENOUS

## 2017-05-15 MED ORDER — DEXTROSE 5 % IV SOLN: 500.0000 mg | Freq: Three times a day (TID) | INTRAVENOUS | Status: DC

## 2017-05-15 MED ORDER — HYDROMORPHONE HCL 1 MG/ML IJ SOLN
1.0000 mg | INTRAMUSCULAR | Status: DC | PRN
  Administered 2017-05-15 (×2): 1 mg via INTRAVENOUS
  Filled 2017-05-15 (×2): qty 1

## 2017-05-15 MED ORDER — FENTANYL CITRATE (PF) 100 MCG/2ML IJ SOLN
INTRAMUSCULAR | Status: DC | PRN
  Administered 2017-05-15: 50 ug via INTRAVENOUS

## 2017-05-15 MED ORDER — ONDANSETRON HCL 4 MG PO TABS: 4.0000 mg | ORAL_TABLET | Freq: Four times a day (QID) | ORAL | Status: DC | PRN

## 2017-05-15 MED ORDER — ONDANSETRON HCL 4 MG/2ML IJ SOLN: 4.0000 mg | Freq: Once | INTRAMUSCULAR | Status: DC | PRN

## 2017-05-15 MED ORDER — ENOXAPARIN SODIUM 40 MG/0.4ML ~~LOC~~ SOLN
40.0000 mg | SUBCUTANEOUS | Status: DC
  Administered 2017-05-15: 40 mg via SUBCUTANEOUS
  Filled 2017-05-15: qty 0.4

## 2017-05-15 MED ORDER — DOCUSATE SODIUM 100 MG PO CAPS
100.0000 mg | ORAL_CAPSULE | Freq: Two times a day (BID) | ORAL | Status: DC
  Administered 2017-05-15: 100 mg via ORAL
  Filled 2017-05-15: qty 1

## 2017-05-15 MED ORDER — DEXAMETHASONE SODIUM PHOSPHATE 10 MG/ML IJ SOLN
INTRAMUSCULAR | Status: DC | PRN
  Administered 2017-05-15: 10 mg via INTRAVENOUS

## 2017-05-15 MED ORDER — VANCOMYCIN HCL 10 G IV SOLR
1500.0000 mg | Freq: Once | INTRAVENOUS | Status: AC
  Administered 2017-05-15: 1500 mg via INTRAVENOUS
  Filled 2017-05-15: qty 1500

## 2017-05-15 MED ORDER — FENTANYL CITRATE (PF) 100 MCG/2ML IJ SOLN: 25.0000 ug | INTRAMUSCULAR | Status: DC | PRN

## 2017-05-15 MED ORDER — DIPHENHYDRAMINE HCL 25 MG PO CAPS: 25.0000 mg | ORAL_CAPSULE | Freq: Four times a day (QID) | ORAL | Status: DC | PRN

## 2017-05-15 MED ORDER — ESMOLOL HCL 100 MG/10ML IV SOLN
INTRAVENOUS | Status: AC
  Filled 2017-05-15: qty 10

## 2017-05-15 MED ORDER — SODIUM CHLORIDE 0.9 % IV SOLN: INTRAVENOUS | Status: DC

## 2017-05-15 MED ORDER — ONDANSETRON HCL 4 MG/2ML IJ SOLN
INTRAMUSCULAR | Status: DC | PRN
  Administered 2017-05-15: 4 mg via INTRAVENOUS

## 2017-05-15 MED ORDER — ACETAMINOPHEN 325 MG PO TABS
650.0000 mg | ORAL_TABLET | Freq: Four times a day (QID) | ORAL | Status: DC | PRN
Start: 2017-05-15 — End: 2017-05-16

## 2017-05-15 MED ORDER — MIDAZOLAM HCL 2 MG/2ML IJ SOLN
INTRAMUSCULAR | Status: AC
  Filled 2017-05-15: qty 2

## 2017-05-15 MED ORDER — CEFEPIME HCL 1 G IJ SOLR
500.0000 mg | Freq: Once | INTRAMUSCULAR | Status: AC
  Administered 2017-05-15: 500 mg via INTRAVENOUS
  Filled 2017-05-15: qty 0.5

## 2017-05-15 MED ORDER — VANCOMYCIN HCL 10 G IV SOLR
1250.0000 mg | Freq: Two times a day (BID) | INTRAVENOUS | Status: DC
  Administered 2017-05-15: 1250 mg via INTRAVENOUS
  Filled 2017-05-15 (×3): qty 1250

## 2017-05-15 MED ORDER — SODIUM CHLORIDE 0.9 % IV BOLUS (SEPSIS)
1000.0000 mL | Freq: Once | INTRAVENOUS | Status: AC
  Administered 2017-05-15: 1000 mL via INTRAVENOUS

## 2017-05-15 MED ORDER — ACETAMINOPHEN 650 MG RE SUPP: 650.0000 mg | Freq: Four times a day (QID) | RECTAL | Status: DC | PRN

## 2017-05-15 MED ORDER — HYDROMORPHONE HCL 1 MG/ML IJ SOLN
0.5000 mg | Freq: Once | INTRAMUSCULAR | Status: AC
  Administered 2017-05-15: 0.5 mg via INTRAVENOUS
  Filled 2017-05-15: qty 1

## 2017-05-15 MED ORDER — SUCCINYLCHOLINE CHLORIDE 20 MG/ML IJ SOLN
INTRAMUSCULAR | Status: DC | PRN
  Administered 2017-05-15: 100 mg via INTRAVENOUS

## 2017-05-15 MED ORDER — PROPOFOL 10 MG/ML IV BOLUS
INTRAVENOUS | Status: DC | PRN
  Administered 2017-05-15: 200 mg via INTRAVENOUS

## 2017-05-15 SURGICAL SUPPLY — 18 items
BLADE CLIPPER SURG (BLADE) ×3 IMPLANT
BLADE SURG 15 STRL LF DISP TIS (BLADE) ×1 IMPLANT
BLADE SURG 15 STRL SS (BLADE) ×2
CANISTER SUCT 3000ML PPV (MISCELLANEOUS) ×3 IMPLANT
ELECT REM PT RETURN 9FT ADLT (ELECTROSURGICAL) ×3
ELECTRODE REM PT RTRN 9FT ADLT (ELECTROSURGICAL) ×1 IMPLANT
GAUZE PACKING IODOFORM 1/2 (PACKING) ×3 IMPLANT
GLOVE BIO SURGEON STRL SZ7 (GLOVE) ×9 IMPLANT
GOWN STRL REUS W/ TWL LRG LVL3 (GOWN DISPOSABLE) ×2 IMPLANT
GOWN STRL REUS W/TWL LRG LVL3 (GOWN DISPOSABLE) ×4
NEEDLE HYPO 22GX1.5 SAFETY (NEEDLE) ×3 IMPLANT
NS IRRIG 1000ML POUR BTL (IV SOLUTION) ×3 IMPLANT
PACK BASIN MINOR ARMC (MISCELLANEOUS) ×3 IMPLANT
PAD ABD DERMACEA PRESS 5X9 (GAUZE/BANDAGES/DRESSINGS) ×3 IMPLANT
SCRUB POVIDONE IODINE 4 OZ (MISCELLANEOUS) ×3 IMPLANT
SOL PREP PVP 2OZ (MISCELLANEOUS) ×3
SOLUTION PREP PVP 2OZ (MISCELLANEOUS) ×1 IMPLANT
SPONGE LAP 18X18 5 PK (GAUZE/BANDAGES/DRESSINGS) ×3 IMPLANT

## 2017-05-15 NOTE — Consult Note (Signed)
Patient ID: Alex Wilkins., male   DOB: 11/25/1980, 36 y.o.   MRN: [id number]  CC: Right leg infection  HPI Alex Wilkins. is a 36 y.o. male who is currently admitted to the medicine service. General surgery consult was requested by Dr. Luberta Mutter for evaluation of a right leg abscess. Per report he's been seen in the ER numerous times for this right leg infection. It was I&D by the ER 2 days ago and he was started on antibiotics. Since that time the infection has worsened and he has been seen in the ER 2 additional times prior to today. Today he states it worsened and due to his anxiety he was admitted for additional wound care and consultation with surgery. He had a similar infection to his left arm that resolved but his right leg continues to worsen. He feels like it's going to rupture and Alex Wilkins at any point. He denies any fevers. Has had chills. He denies nausea, vomiting, chest pain, shortness of breath, diarrhea, constipation.  HPI  Past medical history: Anxiety  Past Surgical History:  Procedure Laterality Date  . LAPAROSCOPIC APPENDECTOMY N/A 07/19/2016   Procedure: APPENDECTOMY LAPAROSCOPIC;  Surgeon: [name], MD;  Location: [facility] ORS;  Service: General;  Laterality: N/A;    Family history: Father with a history of heart block but no history of myocardial infarction or stroke within the family. No family history of cancer or diabetes.  Social History Social History  Substance Use Topics  . Smoking status: Current Some Day Smoker  . Smokeless tobacco: Never Used  . Alcohol use Yes    Allergies  Allergen Reactions  . Penicillins Hives    Has patient had a PCN reaction causing immediate rash, facial/tongue/throat swelling, SOB or lightheadedness with hypotension:no Has patient had a PCN reaction causing severe rash involving mucus membranes or skin necrosis: No Has patient had a PCN reaction that required hospitalization No Has patient had a PCN reaction occurring  within the last 10 years: No If all of the above answers are "NO", then may proceed with Cephalosporin use.   Marland Kitchen Hydrocodone Hives and Rash    Current Facility-Administered Medications  Medication Dose Route Frequency Provider Last Rate Last Dose  . 0.9 %  sodium chloride infusion   Intravenous Continuous Katha Hamming, MD 50 mL/hr at 05/15/17 1314    . acetaminophen (TYLENOL) tablet 650 mg  650 mg Oral Q6H PRN Katha Hamming, MD       Or  . acetaminophen (TYLENOL) suppository 650 mg  650 mg Rectal Q6H PRN Katha Hamming, MD      . bisacodyl (DULCOLAX) EC tablet 5 mg  5 mg Oral Daily PRN Katha Hamming, MD      . diphenhydrAMINE (BENADRYL) capsule 25 mg  25 mg Oral Q6H PRN Katha Hamming, MD      . docusate sodium (COLACE) capsule 100 mg  100 mg Oral BID Katha Hamming, MD      . enoxaparin (LOVENOX) injection 40 mg  40 mg Subcutaneous Q24H Katha Hamming, MD      . HYDROmorphone (DILAUDID) injection 1 mg  1 mg Intravenous Q4H PRN Katha Hamming, MD   1 mg at 05/15/17 1309  . ondansetron (ZOFRAN) tablet 4 mg  4 mg Oral Q6H PRN Katha Hamming, MD       Or  . ondansetron (ZOFRAN) injection 4 mg  4 mg Intravenous Q6H PRN Katha Hamming, MD      . vancomycin (VANCOCIN) 1,250 mg in  sodium chloride 0.9 % 250 mL IVPB  1,250 mg Intravenous Q12H Cindi Carbon, RPH         Review of Systems A Multi-point review of systems was asked and was negative except for the findings documented in history of present illness  Physical Exam Blood pressure (!) 123/95, pulse 82, temperature 97.9 F (36.6 C), temperature source Oral, resp. rate 19, height 6\' 1"  (1.854 m), weight 74.8 kg (165 lb), SpO2 98 %. CONSTITUTIONAL: Resting in bed in no acute distress. EYES: Pupils are equal, round, and reactive to light, Sclera are non-icteric. EARS, NOSE, MOUTH AND THROAT: The oropharynx is clear. The oral mucosa is pink and moist. Hearing is intact to  voice. LYMPH NODES:  Lymph nodes in the neck are normal. RESPIRATORY:  Lungs are clear. There is normal respiratory effort, with equal breath sounds bilaterally, and without pathologic use of accessory muscles. CARDIOVASCULAR: Heart is regular without murmurs, gallops, or rubs. GI: The abdomen is  soft, nontender, and nondistended. There are no palpable masses. There is no hepatosplenomegaly. There are normal bowel sounds in all quadrants. GU: Rectal deferred.   MUSCULOSKELETAL: Normal muscle strength and tone. No cyanosis or edema.   SKIN: Right leg with significant erythema and palpable fluctuance around the central area that has packing in it that is dried with blood. Very tender to palpation. Additionally. Distal this with erythema but no obvious fluctuance. NEUROLOGIC: Motor and sensation is grossly normal. Cranial nerves are grossly intact. PSYCH:  Oriented to person, place and time. Affect is normal.  Data Reviewed Images and labs reviewed. No obvious images for this. This was what was a count is 12.2. Urine drug screen is clear of cocaine. I have personally reviewed the patient's imaging, laboratory findings and medical records.    Assessment    Right leg infection with abscess    Plan    36 year old male with a right leg abscess. Although it is previously been I&D it continues to have fluctuance at the area of the I&D. Discussed with the patient the indication for taken to the operating room for an incision and drainage with washout of the wound. Additional cultures were taken at that time although he has cultured positive for MRSA as an outpatient. Patient voiced understanding and desires to proceed. He understands it'll be done after 7:00 by my partner Dr. Everlene Farrier. He voiced understanding and agrees with this plan.     Time spent with the patient was 50 minutes, with more than 50% of the time spent in face-to-face education, counseling and care coordination.     [name],  MD FACS General Surgeon 05/15/2017, 6:58 PM

## 2017-05-15 NOTE — Anesthesia Postprocedure Evaluation (Signed)
Anesthesia Post Note  Patient: Alex Wilkins.  Procedure(s) Performed: Procedure(s) (LRB): INCISION AND DRAINAGE ABSCESS-RIGHT THIGH (Right)  Patient location during evaluation: PACU Anesthesia Type: General Level of consciousness: awake and alert Pain management: pain level controlled Vital Signs Assessment: post-procedure vital signs reviewed and stable Respiratory status: spontaneous breathing and respiratory function stable Cardiovascular status: stable Anesthetic complications: no     Last Vitals:  Vitals:   05/15/17 2013 05/15/17 2028  BP: 120/89 128/87  Pulse: 95 89  Resp: 19 18  Temp: 36.8 C   SpO2: 100% 100%    Last Pain:  Vitals:   05/15/17 2028  TempSrc:   PainSc: Asleep                 Ilan Kahrs K

## 2017-05-15 NOTE — Anesthesia Procedure Notes (Signed)
Procedure Name: Intubation Date/Time: 05/15/2017 7:27 PM Performed by: Waldo Laine Pre-anesthesia Checklist: Patient identified, Patient being monitored, Timeout performed, Emergency Drugs available and Suction available Patient Re-evaluated:Patient Re-evaluated prior to induction Oxygen Delivery Method: Circle system utilized Preoxygenation: Pre-oxygenation with 100% oxygen Induction Type: Rapid sequence, Cricoid Pressure applied and IV induction Laryngoscope Size: Miller and 2 Grade View: Grade I Tube type: Oral Tube size: 7.0 mm Number of attempts: 1 Airway Equipment and Method: Stylet Placement Confirmation: ETT inserted through vocal cords under direct vision,  positive ETCO2 and breath sounds checked- equal and bilateral Secured at: 21 cm Tube secured with: Tape Dental Injury: Teeth and Oropharynx as per pre-operative assessment

## 2017-05-15 NOTE — Anesthesia Post-op Follow-up Note (Signed)
Anesthesia QCDR form completed.        

## 2017-05-15 NOTE — ED Triage Notes (Signed)
Pt here for MRSA/wound check and possible admission for IV antibiotics. Was seen ihere Sunday and Monday for the same.

## 2017-05-15 NOTE — ED Notes (Signed)
Affected leg iced and elevated at this time

## 2017-05-15 NOTE — Progress Notes (Signed)
Pharmacy Antibiotic Note  Alex Wilkins. is a 36 y.o. male admitted on 05/15/2017 with right thigh abscess/wound/cellulitis.  Pharmacy has been consulted for vancomycin dosing.  Patient was in ED on 8/20 and underwent I&D and started on SMX/TMP. Cephalexin was added on 8/21.   Plan: Vancomycin 1500 mg (20mg /kg) dose given in ED Will order vancomycin 1250 mg IV q12h Goal VT 15-20 mcg/mL VT ordered 8/24 @ 1030 which is prior to 5th dose and should represent steady state  Kinetics: using actual body weight = 75 kg, CrCl 100 mL/min Ke: 0.087 Half-life: 8 hours Vd: 52 L Cmin (estimated) 16 mcg/mL  Height: 6\' 1"  (185.4 cm) Weight: 165 lb (74.8 kg) IBW/kg (Calculated) : 79.9  Temp (24hrs), Avg:97.9 F (36.6 C), Min:97.8 F (36.6 C), Max:97.9 F (36.6 C)   Recent Labs Lab 05/13/17 1407 05/15/17 1011  WBC 16.8* 14.3*  CREATININE 1.12 0.89    Estimated Creatinine Clearance: 123.7 mL/min (by C-G formula based on SCr of 0.89 mg/dL).    Allergies  Allergen Reactions  . Penicillins Hives    Has patient had a PCN reaction causing immediate rash, facial/tongue/throat swelling, SOB or lightheadedness with hypotension:no Has patient had a PCN reaction causing severe rash involving mucus membranes or skin necrosis: No Has patient had a PCN reaction that required hospitalization No Has patient had a PCN reaction occurring within the last 10 years: No If all of the above answers are "NO", then may proceed with Cephalosporin use.   Marland Kitchen Hydrocodone Hives and Rash    Antimicrobials this admission: vancomycin 8/22 >>   Dose adjustments this admission:  Microbiology results: 8/22 BCx: Sent 8/22 Wound: Sent  Thank you for allowing pharmacy to be a part of this patient's care.  Cindi Carbon, PharmD Clinical Pharmacist 05/15/2017 12:34 PM

## 2017-05-15 NOTE — ED Notes (Signed)
Per Dr Mayford Knife, no lactic draw at this time.

## 2017-05-15 NOTE — Progress Notes (Signed)
Pt return from PACU after I&D abscess of the right thigh to room 2227, drowsy but easily arousable , VSS, Pt reoriented to room and call light and bed to lowest position, MIVF retated at NS at ,. Pt was given water to drink per his request. Will cont to monitor.

## 2017-05-15 NOTE — Anesthesia Preprocedure Evaluation (Signed)
Anesthesia Evaluation  Patient identified by MRN, date of birth, ID band Patient awake    Reviewed: Allergy & Precautions, NPO status , Patient's Chart, lab work & pertinent test results  History of Anesthesia Complications Negative for: history of anesthetic complications  Airway Mallampati: I       Dental   Pulmonary neg sleep apnea, neg COPD, Current Smoker,           Cardiovascular hypertension, Pt. on medications (-) Past MI and (-) CHF (-) dysrhythmias (-) Valvular Problems/Murmurs     Neuro/Psych neg Seizures    GI/Hepatic Neg liver ROS, GERD  Poorly Controlled,  Endo/Other  neg diabetes  Renal/GU negative Renal ROS     Musculoskeletal   Abdominal   Peds  Hematology   Anesthesia Other Findings   Reproductive/Obstetrics                             Anesthesia Physical Anesthesia Plan  ASA: II and emergent  Anesthesia Plan: General   Post-op Pain Management:    Induction: Intravenous  PONV Risk Score and Plan: 1 and Ondansetron and Dexamethasone  Airway Management Planned: Oral ETT  Additional Equipment:   Intra-op Plan:   Post-operative Plan:   Informed Consent: I have reviewed the patients History and Physical, chart, labs and discussed the procedure including the risks, benefits and alternatives for the proposed anesthesia with the patient or authorized representative who has indicated his/her understanding and acceptance.     Plan Discussed with:   Anesthesia Plan Comments:         Anesthesia Quick Evaluation

## 2017-05-15 NOTE — Op Note (Addendum)
  05/15/2017  8:07 PM  PATIENT:  Alex Wilkins.  36 y.o. male  PRE-OPERATIVE DIAGNOSIS:  Right Thigh  Abscesses x 2  POST-OPERATIVE DIAGNOSIS:  Same  PROCEDURE:   1. Incision and drainage of complex abscess Right Mid Thigh 2. Excisional debridement of skin subcutaneous tissue and muscle Right Mid thigh measuring 12 square centimeters 3. Incision and drainage of complex abscess Right Anteromedial distal Thigh 2. Excisional debridement of skin subcutaneous tissue and muscle Right anteromedial distal  thigh measuring 2 square centimeters     SURGEON:  Surgeon(s) and Role:    * Kristina Mcnorton F, MD - Primary   ANESTHESIA: GETA  INDICATIONS FOR PROCEDURE Thigh  abscesses  DICTATION:  Patient was playing about proceeding detail, risk benefits possible complications and a consent was obtained. The patient taken to the operating room and placed in the supine position.     Elliptical incision created incorporated small defect, abscess was drained and  pus cultured. There was complex luculations that we were able to lyse with a combination of finger fracture and suction device. All the loculations were broken down. Using a sharp curette we debrided the sub q tissue down to the muscle to include fascia. Hemostasis was obtained with electrocautery.   Attention was turned to the distal thigh with a second abscess was found. Using a 15 blade knife we perform an elliptical incision and drainage the abscess. We broke down the loculations with a hemostat and using a curette we perform an excisional debridement to incorporate the subcutis tissue and muscle. This is achieved with electrocautery.  Irrigation with normal saline was perfomed and the wounds were packed with half-inch packing.   Needle and laparotomy counts were correct and there were no immediate complications  Given the multiple abscesses, significant cellulitis and patient's rick factors to include polysubstance abuse I am going  to add cefepime to his antibiotic regimen.  [name], MD

## 2017-05-15 NOTE — Transfer of Care (Signed)
Immediate Anesthesia Transfer of Care Note  Patient: Alex Wilkins.  Procedure(s) Performed: Procedure(s): INCISION AND DRAINAGE ABSCESS-RIGHT THIGH (Right)  Patient Location: PACU  Anesthesia Type:General  Level of Consciousness: sedated and patient cooperative  Airway & Oxygen Therapy: Patient Spontanous Breathing and Patient connected to face mask oxygen  Post-op Assessment: Report given to RN and Post -op Vital signs reviewed and stable  Post vital signs: Reviewed and stable  Last Vitals:  Vitals:   05/15/17 1220 05/15/17 2013  BP:  120/89  Pulse:  95  Resp:    Temp: 36.6 C 36.8 C  SpO2:  100%    Last Pain:  Vitals:   05/15/17 2013  TempSrc:   PainSc: Asleep         Complications: No apparent anesthesia complications

## 2017-05-15 NOTE — ED Provider Notes (Addendum)
[facility] Emergency Department Provider Note  ____________________________________________  Time seen: Approximately 10:50 AM  I have reviewed the triage vital signs and the nursing notes.   HISTORY  Chief Complaint Wound Infection    HPI Alex Jepperson. is a 36 y.o. male , otherwise healthy, presenting with right upper thigh abscess and cellulitis that is failing outpatient antibiotic treatment. The patient was seen 8/20 for abscess which was incised and drained and started on Bactrim. He re-presented 8/58for worsening symptoms and refused admission. Keflex was added to his antibiotic regimen. Today, the patient is here because of significantly worsening erythema, swelling, and pain at the abscess site on the right medial thigh. He denies any systemic symptoms including nausea or vomiting, fever or chills. He notes a "bump" in the inguinal crease    History reviewed. No pertinent past medical history.  Patient Active Problem List   Diagnosis Date Noted  . Status post laparoscopic appendectomy 08/05/2016  . Pain following surgery or procedure   . Atrial fibrillation with rapid ventricular response [facility])     Past Surgical History:  Procedure Laterality Date  . LAPAROSCOPIC APPENDECTOMY N/A 07/19/2016   Procedure: APPENDECTOMY LAPAROSCOPIC;  Surgeon: [name], MD;  Location: [facility] ORS;  Service: General;  Laterality: N/A;    Current Outpatient Rx  . Order #: [id number] Class: Historical Med  . Order #: [id number] Class: Historical Med  . Order #: [id number] Class: Print  . Order #: [id number] Class: Historical Med  . Order #: [id number] Class: Historical Med  . Order #: [id number] Class: Historical Med  . Order #: [id number] Class: Historical Med  . Order #: [id number] Class: Print  . Order #: [id number] Class: Historical Med  . Order #: [id number] Class: Historical Med    Allergies Penicillins and Hydrocodone  No family history on file.  Social  History Social History  Substance Use Topics  . Smoking status: Current Some Day Smoker  . Smokeless tobacco: Never Used  . Alcohol use Yes    Review of Systems  Constitutional: No fever/chills. No lightheadedness or syncope. No general malaise. Eyes: No visual changes. ENT: No sore throat. No congestion or rhinorrhea. Respiratory: Denies shortness of breath.   Gastrointestinal: No abdominal pain.  No nausea, no vomiting.  No diarrhea.  No constipation. Musculoskeletal: Negative for back pain. No pain in the right hip or knee joint. Skin: Positive for abscess and cellulitis. Neurological: Negative for headaches. No focal numbness, tingling or weakness.     ____________________________________________   PHYSICAL EXAM:  VITAL SIGNS: ED Triage Vitals  Enc Vitals Group     BP 05/15/17 0957 (P) 124/83     Pulse Rate 05/15/17 0957 (P) 91     Resp 05/15/17 0957 (P) 16     Temp 05/15/17 0957 (P) 97.8 F (36.6 C)     Temp Source 05/15/17 0957 (P) Oral     SpO2 05/15/17 0957 (P) 97 %     Weight 05/15/17 0946 165 lb (74.8 kg)     Height 05/15/17 0946 6\' 1"  (1.854 m)     Head Circumference --      Peak Flow --      Pain Score 05/15/17 0945 7     Pain Loc --      Pain Edu? --      Excl. in GC? --     Constitutional: Alert and oriented. Well appearing and in no acute distress. Answers questions appropriately. Eyes: Conjunctivae are normal.  EOMI. No scleral icterus. Head: Atraumatic. Nose: No congestion/rhinnorhea. Mouth/Throat:  Mucous membranes are moist.  Neck: No stridor.  Supple.  No JVD. No meningismus. Cardiovascular: Normal rate, regular rhythm. No murmurs, rubs or gallops.  Respiratory: Normal respiratory effort.  No accessory muscle use or retractions. Lungs CTAB.  No wheezes, rales or ronchi. Gastrointestinal: Soft, nontender and nondistended.  No guarding or rebound.  No peritoneal signs. Musculoskeletal: No LE edema.  Neurologic:  A&Ox3.  Speech is clear.  Face  and smile are symmetric.  EOMI.  Moves all extremities well. Skin:  The patient has a 1.5 cm incision site in the mid right medial thigh that is packed with sterile ribbon. There is some mild purulent discharge from the site. He has significantly extensive erythema at least 10 inches in circumference from the incision site. He has a palpable inguinal lymph node on the right. Full range of motion of the right hip and knee without pain. Normal DP and PT pulse on the right. Psychiatric: Mood and affect are normal. Speech and behavior are normal.  Normal judgement.  ____________________________________________   LABS (all labs ordered are listed, but only abnormal results are displayed)  Labs Reviewed  COMPREHENSIVE METABOLIC PANEL - Abnormal; Notable for the following:       Result Value   Glucose, Bld 112 (*)    All other components within normal limits  CBC WITH DIFFERENTIAL/PLATELET - Abnormal; Notable for the following:    WBC 14.3 (*)    RBC 4.30 (*)    HCT 39.8 (*)    Neutro Abs 12.1 (*)    Lymphs Abs 0.9 (*)    Monocytes Absolute 1.2 (*)    All other components within normal limits  CULTURE, BLOOD (ROUTINE X 2)  CULTURE, BLOOD (ROUTINE X 2)  URINALYSIS, COMPLETE (UACMP) WITH MICROSCOPIC   ____________________________________________  EKG  Not indicated ____________________________________________  RADIOLOGY  No results found.  ____________________________________________   PROCEDURES  Procedure(s) performed: None  Procedures  Critical Care performed: No ____________________________________________   INITIAL IMPRESSION / ASSESSMENT AND PLAN / ED COURSE  Pertinent labs & imaging results that were available during my care of the patient were reviewed by me and considered in my medical decision making (see chart for details).  36 y.o. male with right medial thigh abscess and cellulitis that is failing outpatient antibiotic treatment with Bactrim and Keflex. At  this time the patient has reassuring vital signs and has no evidence of systemic illness or sepsis. His white blood cell count is 14.3, down from 16 2 days ago. I have ordered vancomycin and he has been admitted to the hospitalist for further evaluation and treatment. He shouldn't will be given symptomatic treatment in the emergency department and is in agreement with the plan today.  ____________________________________________  FINAL CLINICAL IMPRESSION(S) / ED DIAGNOSES  Final diagnoses:  Cellulitis of right thigh         NEW MEDICATIONS STARTED DURING THIS VISIT:  New Prescriptions   No medications on file      Rockne Menghini, MD 05/15/17 1054    Rockne Menghini, MD 05/15/17 1124

## 2017-05-15 NOTE — H&P (Signed)
[facility] [place] [facility]   PATIENT NAME: Alex Wilkins    MR#:  [id number]  DATE OF BIRTH:  02-Jul-1981  DATE OF ADMISSION:  05/15/2017  PRIMARY CARE PHYSICIAN: Patient, No Pcp Per   REQUESTING/REFERRING PHYSICIAN: Sharma Covert  CHIEF COMPLAINT: Wound infection    Chief Complaint  Patient presents with  . Wound Infection    HISTORY OF PRESENT ILLNESS:  Alex Wilkins  is a 36 y.o. male with a known history of Chronic anxiety comes in because of right I abscess, cellulitis, failed outpatient antibiotic treatment. Patient was seen in the emergency room on August 20 for abscess and it was incised and drained and started on Bactrim and he was discharged from the emergency room. He came yesterday again for worsening symptoms and he refused admission so Keflex was added to his antibiotic regimen. Today he came because of worsening redness, swelling, pain at the abscess site on the right medial thigh. Patient has no nausea or vomiting. No fevers. Because of worsening infection he came here she has significant pain in the right thigh. Patient still has right thigh abscess but ER physician gave me admission for right thigh cellulitis. Patient also still needs drainage of a right thigh abscess. Admit to medical service today, call surgery and continue IV vancomycin and get wound cultures from right thigh 1. And do dressing changes to the right thigh.  PAST MEDICAL HISTORY:  History reviewed. No pertinent past medical history.  PAST SURGICAL HISTOIRY:   Past Surgical History:  Procedure Laterality Date  . LAPAROSCOPIC APPENDECTOMY N/A 07/19/2016   Procedure: APPENDECTOMY LAPAROSCOPIC;  Surgeon: [name], MD;  Location: [facility] ORS;  Service: General;  Laterality: N/A;    SOCIAL HISTORY:   Social History  Substance Use Topics  . Smoking status: Current Some Day Smoker  . Smokeless tobacco: Never Used  . Alcohol use Yes    FAMILY HISTORY:  No family history  on file.  DRUG ALLERGIES:   Allergies  Allergen Reactions  . Penicillins Hives    Has patient had a PCN reaction causing immediate rash, facial/tongue/throat swelling, SOB or lightheadedness with hypotension:no Has patient had a PCN reaction causing severe rash involving mucus membranes or skin necrosis: No Has patient had a PCN reaction that required hospitalization No Has patient had a PCN reaction occurring within the last 10 years: No If all of the above answers are "NO", then may proceed with Cephalosporin use.   Marland Kitchen Hydrocodone Hives and Rash    REVIEW OF SYSTEMS:  CONSTITUTIONAL: No fever, fatigue or weakness.  EYES: No blurred or double vision.  EARS, NOSE, AND THROAT: No tinnitus or ear pain.  RESPIRATORY: No cough, shortness of breath, wheezing or hemoptysis.  CARDIOVASCULAR: No chest pain, orthopnea, edema.  GASTROINTESTINAL: No nausea, vomiting, diarrhea or abdominal pain.  GENITOURINARY: No dysuria, hematuria.  ENDOCRINE: No polyuria, nocturia,  HEMATOLOGY: No anemia, easy bruising or bleeding SKIN: No rash or lesion. MUSCULOSKELETAL:Pain, swelling, tenderness in the right thigh going on for the past 3 days.  NEUROLOGIC: No tingling, numbness, weakness.  PSYCHIATRY: No anxiety or depression.   MEDICATIONS AT HOME:   Prior to Admission medications   Medication Sig Start Date End Date Taking? Authorizing Provider  acetaminophen (TYLENOL) 500 MG tablet Take 500 mg by mouth every 4 (four) hours as needed.   Yes [provider]  alprazolam Prudy Feeler) 2 MG tablet Take 2 mg by mouth 4 (four) times daily.   Yes [provider]  cephALEXin (KEFLEX) 500 MG capsule Take 1 capsule (500 mg total) by mouth 4 (four) times daily. 05/14/17  Yes Tommi Rumps, PA-C  oxyCODONE-acetaminophen (ROXICET) 5-325 MG tablet Take 1 tablet by mouth every 6 (six) hours as needed. 05/14/17 05/14/18 Yes Summers, Rhonda L, PA-C  sulfamethoxazole-trimethoprim (BACTRIM DS,SEPTRA DS)  800-160 MG tablet Take 2 tablets by mouth 2 (two) times daily. 05/13/17 05/20/17 Yes [provider]      VITAL SIGNS:  Blood pressure 130/87, pulse 91, temperature (P) 97.8 F (36.6 C), temperature source (P) Oral, resp. rate (!) 24, height 6\' 1"  (1.854 m), weight 74.8 kg (165 lb), SpO2 96 %.  PHYSICAL EXAMINATION:  GENERAL:  36 y.o.-year-old patient lying in the bed with no acute distress.  EYES: Pupils equal, round, reactive to light and accommodation. No scleral icterus. Extraocular muscles intact.  HEENT: Head atraumatic, normocephalic. Oropharynx and nasopharynx clear.  NECK:  Supple, no jugular venous distention. No thyroid enlargement, no tenderness.  LUNGS: Normal breath sounds bilaterally, no wheezing, rales,rhonchi or crepitation. No use of accessory muscles of respiration.  CARDIOVASCULAR: S1, S2 normal. No murmurs, rubs, or gallops.  ABDOMEN: Soft, nontender, nondistended. Bowel sounds present. No organomegaly or mass.  EXTREMITIES:Patient has history of tenderness and abscess and purulent drainage with extensive erythema on the right thigh. Patient has palpable inguinal lymph node on the right side.  NEUROLOGIC: Cranial nerves II through XII are intact. Muscle strength 5/5 in all extremities. Sensation intact. Gait not checked.  PSYCHIATRIC: The patient is alert and oriented x 3.  SKIN: Nredness, swelling, tenderness in the right thigh cellulitis area. CBC  Recent Labs Lab 05/15/17 1011  WBC 14.3*  HGB 13.7  HCT 39.8*  PLT 248   ------------------------------------------------------------------------------------------------------------------  Chemistries   Recent Labs Lab 05/15/17 1011  NA 137  K 4.0  CL 101  CO2 28  GLUCOSE 112*  BUN 8  CREATININE 0.89  CALCIUM 9.4  AST 21  ALT 24  ALKPHOS 83  BILITOT 0.6   ------------------------------------------------------------------------------------------------------------------  Cardiac Enzymes No  results for input(s): TROPONINI in the last 168 hours. ------------------------------------------------------------------------------------------------------------------  RADIOLOGY:  No results found.  EKG:   Orders placed or performed during the hospital encounter of 03/09/17  . EKG 12-Lead  . EKG 12-Lead    IMPRESSION AND PLAN:   #1 .right medial thigh abscess, failed outpatient therapy. Patient still has a right medial thigh abscess that needs to be drained further.  Consult surgery for further abscess drainage, continue vancomycin, pain medicines.     All the records are reviewed and case discussed with ED provider. Management plans discussed with the patient, family and they are in agreement.  CODE STATUS: full  TOTAL TIME TAKING CARE OF THIS PATIENT: .    Katha Hamming M.D on 05/15/2017 at 11:55 AM  Between 7am to 6pm - Pager - [phone]  After 6pm go to [email or web address] - password [email or web address]  [facility]  Office  [phone]  CC: Primary care physician; Patient, No Pcp Per  Note: This dictation was prepared with Dragon dictation along with smaller phrase technology. Any transcriptional errors that result from this process are unintentional.

## 2017-05-16 LAB — AEROBIC CULTURE  (SUPERFICIAL SPECIMEN): SPECIAL REQUESTS: NORMAL

## 2017-05-16 LAB — CBC
HEMATOCRIT: 41 % (ref 40.0–52.0)
HEMOGLOBIN: 14.1 g/dL (ref 13.0–18.0)
MCH: 31.6 pg (ref 26.0–34.0)
MCHC: 34.3 g/dL (ref 32.0–36.0)
MCV: 92.2 fL (ref 80.0–100.0)
Platelets: 267 10*3/uL (ref 150–440)
RBC: 4.45 MIL/uL (ref 4.40–5.90)
RDW: 12.1 % (ref 11.5–14.5)
WBC: 12.3 10*3/uL — AB (ref 3.8–10.6)

## 2017-05-16 LAB — BASIC METABOLIC PANEL
ANION GAP: 7 (ref 5–15)
BUN: 9 mg/dL (ref 6–20)
CO2: 31 mmol/L (ref 22–32)
Calcium: 9.1 mg/dL (ref 8.9–10.3)
Chloride: 102 mmol/L (ref 101–111)
Creatinine, Ser: 0.78 mg/dL (ref 0.61–1.24)
Glucose, Bld: 166 mg/dL — ABNORMAL HIGH (ref 65–99)
POTASSIUM: 4.7 mmol/L (ref 3.5–5.1)
SODIUM: 140 mmol/L (ref 135–145)

## 2017-05-16 LAB — AEROBIC CULTURE W GRAM STAIN (SUPERFICIAL SPECIMEN)

## 2017-05-16 LAB — HIV ANTIBODY (ROUTINE TESTING W REFLEX): HIV Screen 4th Generation wRfx: NONREACTIVE

## 2017-05-16 LAB — GLUCOSE, CAPILLARY: Glucose-Capillary: 159 mg/dL — ABNORMAL HIGH (ref 65–99)

## 2017-05-16 MED ORDER — OXYCODONE HCL 5 MG PO TABS
5.0000 mg | ORAL_TABLET | ORAL | Status: DC | PRN
Start: 2017-05-16 — End: 2017-05-16
  Administered 2017-05-16 (×2): 10 mg via ORAL
  Filled 2017-05-16 (×2): qty 2

## 2017-05-16 MED ORDER — OXYCODONE HCL 5 MG PO TABS: 5.0000 mg | ORAL_TABLET | Freq: Four times a day (QID) | ORAL | 0 refills | Status: DC | PRN

## 2017-05-16 MED ORDER — SULFAMETHOXAZOLE-TRIMETHOPRIM 800-160 MG PO TABS: 2.0000 | ORAL_TABLET | Freq: Two times a day (BID) | ORAL | 0 refills | Status: AC

## 2017-05-16 MED ORDER — HYDROMORPHONE HCL 1 MG/ML IJ SOLN
1.0000 mg | INTRAMUSCULAR | Status: DC | PRN
  Administered 2017-05-16 (×2): 1 mg via INTRAVENOUS
  Filled 2017-05-16 (×2): qty 1

## 2017-05-16 MED ORDER — MORPHINE SULFATE (PF) 2 MG/ML IV SOLN
2.0000 mg | INTRAVENOUS | Status: DC | PRN
  Administered 2017-05-16: 2 mg via INTRAVENOUS
  Administered 2017-05-16: 4 mg via INTRAVENOUS
  Filled 2017-05-16 (×2): qty 2

## 2017-05-16 NOTE — Discharge Summary (Signed)
[facility] [place] at [facility]   PATIENT NAME: Alex Wilkins    MR#:  [id number]  DATE OF BIRTH:  Jun 28, 1981  DATE OF ADMISSION:  05/15/2017 ADMITTING PHYSICIAN: Katha Hamming, MD  DATE OF DISCHARGE: 05/16/2017  PRIMARY CARE PHYSICIAN: Patient, No Pcp Per    ADMISSION DIAGNOSIS:  Cellulitis of right thigh [L03.115]  DISCHARGE DIAGNOSIS:  Active Problems:   Cellulitis and abscess of right leg   SECONDARY DIAGNOSIS:  None  HOSPITAL COURSE:  36 year old male with chronic anxiety presents with right leg abscess.  1. Right leg abscess: Patient had culture reported from August 20 which was MRSA positive and sensitive to Bactrim. He presented due to worsening of cellulitis and abscess. He had an incision and drainage by the surgical services on the day of admission which is August 22. He will continue with Bactrim as per sensitivities. He will need to keep packing in place until evaluated by surgery on Tuesday. He will need a few tablets of narcotics due to the intensity of the pain.     DISCHARGE CONDITIONS AND DIET:   Stable for discharge and regular diet  CONSULTS OBTAINED:  Treatment Team:  [name], MD  DRUG ALLERGIES:   Allergies  Allergen Reactions  . Penicillins Hives    Has patient had a PCN reaction causing immediate rash, facial/tongue/throat swelling, SOB or lightheadedness with hypotension:no Has patient had a PCN reaction causing severe rash involving mucus membranes or skin necrosis: No Has patient had a PCN reaction that required hospitalization No Has patient had a PCN reaction occurring within the last 10 years: No If all of the above answers are "NO", then may proceed with Cephalosporin use.   Marland Kitchen Hydrocodone Hives and Rash    DISCHARGE MEDICATIONS:   Current Discharge Medication List    START taking these medications   Details  oxyCODONE (OXY IR/ROXICODONE) 5 MG immediate release tablet Take 1 tablet (5 mg total)  by mouth every 6 (six) hours as needed for moderate pain. Qty: 10 tablet, Refills: 0      CONTINUE these medications which have CHANGED   Details  sulfamethoxazole-trimethoprim (BACTRIM DS,SEPTRA DS) 800-160 MG tablet Take 2 tablets by mouth 2 (two) times daily. Qty: 36 tablet, Refills: 0      CONTINUE these medications which have NOT CHANGED   Details  acetaminophen (TYLENOL) 500 MG tablet Take 500 mg by mouth every 4 (four) hours as needed.    alprazolam (XANAX) 2 MG tablet Take 2 mg by mouth 4 (four) times daily.      STOP taking these medications     cephALEXin (KEFLEX) 500 MG capsule      oxyCODONE-acetaminophen (ROXICET) 5-325 MG tablet           Today   CHIEF COMPLAINT:   Feels that the pain is getting a bit better after incision and drainage. Although still has 8 out of 10 pain at times. Cellulitis is improved. He has good range of motion of his knee joint   VITAL SIGNS:  Blood pressure 125/80, pulse 84, temperature 97.9 F (36.6 C), temperature source Oral, resp. rate 18, height 6\' 1"  (1.854 m), weight 74.8 kg (165 lb), SpO2 100 %.   REVIEW OF SYSTEMS:  Review of Systems  Constitutional: Negative.  Negative for chills, fever and malaise/fatigue.  HENT: Negative.  Negative for ear discharge, ear pain, hearing loss, nosebleeds and sore throat.   Eyes: Negative.  Negative for blurred vision and pain.  Respiratory: Negative.  Negative for cough, hemoptysis, shortness of breath and wheezing.   Cardiovascular: Negative.  Negative for chest pain, palpitations and leg swelling.  Gastrointestinal: Negative.  Negative for abdominal pain, blood in stool, diarrhea, nausea and vomiting.  Genitourinary: Negative.  Negative for dysuria.  Musculoskeletal: Negative.  Negative for back pain.  Skin:       Improved erythema  Neurological: Negative for dizziness, tremors, speech change, focal weakness, seizures and headaches.  Endo/Heme/Allergies: Negative.  Does not  bruise/bleed easily.  Psychiatric/Behavioral: Negative.  Negative for depression, hallucinations and suicidal ideas.     PHYSICAL EXAMINATION:  GENERAL:  36 y.o.-year-old patient lying in the bed with no acute distress.  NECK:  Supple, no jugular venous distention. No thyroid enlargement, no tenderness.  LUNGS: Normal breath sounds bilaterally, no wheezing, rales,rhonchi  No use of accessory muscles of respiration.  CARDIOVASCULAR: S1, S2 normal. No murmurs, rubs, or gallops.  ABDOMEN: Soft, non-tender, non-distended. Bowel sounds present. No organomegaly or mass.  EXTREMITIES: No pedal edema, cyanosis, or clubbing.  PSYCHIATRIC: The patient is alert and oriented x 3.  SKIN: He has packing placed for 2 sites of incision. Erythema has much resolved from demarcated area. Very mild tenderness no area of fluctuance  DATA REVIEW:   CBC  Recent Labs Lab 05/16/17 0310  WBC 12.3*  HGB 14.1  HCT 41.0  PLT 267    Chemistries   Recent Labs Lab 05/15/17 1011  05/16/17 0310  NA 137  --  140  K 4.0  --  4.7  CL 101  --  102  CO2 28  --  31  GLUCOSE 112*  --  166*  BUN 8  --  9  CREATININE 0.89  < > 0.78  CALCIUM 9.4  --  9.1  AST 21  --   --   ALT 24  --   --   ALKPHOS 83  --   --   BILITOT 0.6  --   --   < > = values in this interval not displayed.  Cardiac Enzymes No results for input(s): TROPONINI in the last 168 hours.  Microbiology Results  @MICRORSLT48 @  RADIOLOGY:  No results found.    Current Discharge Medication List    START taking these medications   Details  oxyCODONE (OXY IR/ROXICODONE) 5 MG immediate release tablet Take 1 tablet (5 mg total) by mouth every 6 (six) hours as needed for moderate pain. Qty: 10 tablet, Refills: 0      CONTINUE these medications which have CHANGED   Details  sulfamethoxazole-trimethoprim (BACTRIM DS,SEPTRA DS) 800-160 MG tablet Take 2 tablets by mouth 2 (two) times daily. Qty: 36 tablet, Refills: 0      CONTINUE  these medications which have NOT CHANGED   Details  acetaminophen (TYLENOL) 500 MG tablet Take 500 mg by mouth every 4 (four) hours as needed.    alprazolam (XANAX) 2 MG tablet Take 2 mg by mouth 4 (four) times daily.      STOP taking these medications     cephALEXin (KEFLEX) 500 MG capsule      oxyCODONE-acetaminophen (ROXICET) 5-325 MG tablet            Management plans discussed with the patient and he is in agreement. Stable for discharge home  Patient should follow up with surgery  CODE STATUS:     Code Status Orders        Start     Ordered   05/15/17 1152  Full code  Continuous     05/15/17 1153    Code Status History    Date Active Date Inactive Code Status Order ID Comments User Context   07/19/2016  4:56 AM 07/20/2016  6:21 PM Full Code [id number]  [name], MD Inpatient      TOTAL TIME TAKING CARE OF THIS PATIENT: 37 minutes.    Note: This dictation was prepared with Dragon dictation along with smaller phrase technology. Any transcriptional errors that result from this process are unintentional.  Enedina Pair M.D on 05/16/2017 at 8:30 AM  Between 7am to 6pm - Pager - [phone] After 6pm go to [email or web address] - password [email or web address]  [facility]  Office  [phone]  CC: Primary care physician; Patient, No Pcp Per

## 2017-05-16 NOTE — Care Management (Signed)
No identified RNCM needs. No changes to previous antibiotics.

## 2017-05-16 NOTE — Progress Notes (Signed)
Pt to be discharged per MD order. IV removed. Instructions reviewed with pt and all questions answered. Education provided for dressing changes, pt demonstrates proper teach back. Scripts given and follow up appointments made. WIll discharge later this afternoon when ride is available.

## 2017-05-17 ENCOUNTER — Telehealth: Payer: Self-pay

## 2017-05-17 NOTE — Telephone Encounter (Signed)
Patient was not available at this time. Told spouse that I would try back on Monday to see how he was doing.

## 2017-05-20 ENCOUNTER — Other Ambulatory Visit: Payer: Self-pay

## 2017-05-20 ENCOUNTER — Encounter: Payer: Self-pay | Admitting: Surgery

## 2017-05-20 LAB — CULTURE, BLOOD (ROUTINE X 2)
CULTURE: NO GROWTH
Culture: NO GROWTH
SPECIAL REQUESTS: ADEQUATE

## 2017-05-20 NOTE — Progress Notes (Signed)
Patient was discharged on Septra for a MRSA wound infection. Subsequent wound culture resulted with MRSA resistant to Septra. Dr. Cherlynn Kaiser authorized a prescription for doxycycline 100 mg BID x 10 days that was called into [address] [facility] previously) on [address] in [place]. Patient was instructed to discontinue Septra.   Luisa Hart, PharmD Clinical Pharmacist

## 2017-05-21 ENCOUNTER — Encounter: Payer: Self-pay | Admitting: Surgery

## 2017-05-21 ENCOUNTER — Ambulatory Visit (INDEPENDENT_AMBULATORY_CARE_PROVIDER_SITE_OTHER): Payer: Self-pay | Admitting: Surgery

## 2017-05-21 VITALS — BP 132/89 | HR 108 | Temp 98.2°F | Ht 73.0 in | Wt 156.4 lb

## 2017-05-21 DIAGNOSIS — Z09 Encounter for follow-up examination after completed treatment for conditions other than malignant neoplasm: Secondary | ICD-10-CM

## 2017-05-21 LAB — AEROBIC/ANAEROBIC CULTURE W GRAM STAIN (SURGICAL/DEEP WOUND): Gram Stain: NONE SEEN

## 2017-05-21 LAB — AEROBIC/ANAEROBIC CULTURE (SURGICAL/DEEP WOUND)

## 2017-05-21 NOTE — Patient Instructions (Signed)
Continue to change the packing in your wound daily.  We will send ina referral to a Primary Care Provider for you. They will contact you with an appointment to follow care on your wounds.  If you need anything else from [place] please give our office a call.

## 2017-05-21 NOTE — Progress Notes (Signed)
S/p I/D thigh abscess doing well MRSA res to bactrim A/Bs was changed by hospitalist but he has not pick up his new script No fevers or chills Apparently did not know to change his packing daily. HE removed the packing yesterday  PE NAD Wounds healing well, repacked. Significant improvement  A/p Doing well Recommend daily packing change A/Bs  pcp referal

## 2017-06-02 DIAGNOSIS — Z8614 Personal history of Methicillin resistant Staphylococcus aureus infection: Secondary | ICD-10-CM | POA: Insufficient documentation

## 2017-06-02 DIAGNOSIS — Z4801 Encounter for change or removal of surgical wound dressing: Secondary | ICD-10-CM | POA: Diagnosis present

## 2017-06-02 NOTE — ED Triage Notes (Signed)
Pt states he had surgery to drain MRSA infection in leg, but pt states he has 3 more spots.  Pt states they gave him abx, but that they have no helped.  Pt A&Ox4, in NAD, ambulatory to triage.

## 2017-06-03 ENCOUNTER — Emergency Department
Admission: EM | Admit: 2017-06-03 | Discharge: 2017-06-03 | Disposition: A | Discharge status: Home / Self Care | Payer: Medicaid Other | Attending: Emergency Medicine | Admitting: Emergency Medicine

## 2017-06-04 ENCOUNTER — Emergency Department
Admission: EM | Admit: 2017-06-04 | Discharge: 2017-06-04 | Disposition: A | Discharge status: Home / Self Care | Payer: Medicaid Other | Attending: Emergency Medicine | Admitting: Emergency Medicine

## 2017-06-04 ENCOUNTER — Encounter: Payer: Self-pay | Admitting: General Practice

## 2017-06-04 DIAGNOSIS — F17200 Nicotine dependence, unspecified, uncomplicated: Secondary | ICD-10-CM | POA: Insufficient documentation

## 2017-06-04 DIAGNOSIS — L03115 Cellulitis of right lower limb: Secondary | ICD-10-CM | POA: Diagnosis not present

## 2017-06-04 DIAGNOSIS — L03113 Cellulitis of right upper limb: Secondary | ICD-10-CM | POA: Diagnosis not present

## 2017-06-04 DIAGNOSIS — I480 Paroxysmal atrial fibrillation: Secondary | ICD-10-CM | POA: Diagnosis not present

## 2017-06-04 DIAGNOSIS — Z4801 Encounter for change or removal of surgical wound dressing: Secondary | ICD-10-CM | POA: Diagnosis present

## 2017-06-04 NOTE — ED Provider Notes (Signed)
[facility] Emergency Department Provider Note   ____________________________________________   First MD Initiated Contact with Patient 06/04/17 [phone]     (approximate)  I have reviewed the triage vital signs and the nursing notes.   HISTORY  Chief Complaint Wound Check    HPI Alex NeerKenneth G Greenhalgh Jr. is a 36 y.o. male patient presents today with multiple abscesses on the upper and lower right extremities. Patient recently had an I&D of the right thigh on 05/13/2017. Patient is followed by the surgical clinic but decided come to the emergency room secondary to no lesions. 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 Patient states is upset because he came to the emergency room and stated he waited 7 hours yesterday and left without being seen. The patient's history and records reveal that he is being followed by the surgical clinic but elected not to contact them for follow-up.  Past Medical History:  Diagnosis Date  . Atrial fibrillation with rapid ventricular response (HCC)   . Cellulitis and abscess of right leg 05/15/2017  . Pain following surgery or procedure   . Status post laparoscopic appendectomy 08/05/2016    Patient Active Problem List   Diagnosis Date Noted  . Cellulitis and abscess of right leg 05/15/2017  . Status post laparoscopic appendectomy 08/05/2016  . Pain following surgery or procedure   . Atrial fibrillation with rapid ventricular response [facility](HCC)     Past Surgical History:  Procedure Laterality Date  . INCISION AND DRAINAGE ABSCESS Right 05/15/2017   Procedure: INCISION AND DRAINAGE ABSCESS-RIGHT THIGH;  Surgeon: Leafy RoPabon, Diego F, MD;  Location: [facility] ORS;  Service: General;  Laterality: Right;  . LAPAROSCOPIC APPENDECTOMY N/A 07/19/2016   Procedure: APPENDECTOMY LAPAROSCOPIC;  Surgeon: Henrene DodgeJose Piscoya, MD;  Location: [facility] ORS;  Service: General;  Laterality: N/A;     Prior to Admission medications   Medication Sig Start Date End Date Taking? Authorizing Provider  cephALEXin (KEFLEX) 500 MG capsule take 1 capsule by mouth four times a day 05/14/17   [provider]  diazepam (VALIUM) 5 MG tablet take 1 tablet by mouth 30 MINUTES PRIOR TO MRI. MAY REPEAT 1 TIME FOR ANXIETY/CLAUSTROPHOBIA 05/01/17   [provider]  doxycycline (VIBRAMYCIN) 100 MG capsule Take 1 capsule by mouth 2 (two) times daily. 05/20/17   [provider]  ibuprofen (ADVIL,MOTRIN) 600 MG tablet Take 600 mg by mouth. 05/12/17   [provider]  oxyCODONE (OXY IR/ROXICODONE) 5 MG immediate release tablet Take 1 tablet (5 mg total) by mouth every 6 (six) hours as needed for moderate pain. 05/16/17   Adrian SaranMody, Sital, MD  oxyCODONE-acetaminophen (PERCOCET/ROXICET) 5-325 MG tablet take 1 tablet by mouth every 6 hours if needed for pain 05/16/17   [provider]    Allergies Penicillins and Hydrocodone  No family history on file.  Social History Social History  Substance Use Topics  . Smoking status: Current Some Day Smoker  . Smokeless tobacco: Never Used  . Alcohol use No    Review of Systems Constitutional: No fever/chills Eyes: No visual changes. ENT: No sore throat. Cardiovascular: Denies chest pain. Respiratory: Denies shortness of breath. Gastrointestinal: No abdominal pain.  No nausea, no vomiting.  No diarrhea.  No constipation. Genitourinary: Negative for dysuria. Musculoskeletal: Negative for back pain. Skin: Negative for rash. Redness and swelling to the upper and lower right extremities. Neurological: Negative for headaches, focal weakness or numbness. Hematological/Lymphatic: Allergic/Immunilogical: Penicillin and  Percocet ____________________________________________   PHYSICAL EXAM:  VITAL SIGNS: ED Triage Vitals [06/04/17 0744]  Enc Vitals Group     BP (!) 137/98     Pulse Rate 100     Resp 18     Temp 97.6 F (36.4  C)     Temp Source Oral     SpO2 100 %     Weight 156 lb (70.8 kg)     Height  (1.854 m)     Head Circumference      Peak Flow      Pain Score 7     Pain Loc      Pain Edu?      Excl. in GC?     Constitutional: Alert and oriented. Well appearing and in no acute distress. Cardiovascular: Normal rate, regular rhythm. Grossly normal heart sounds.  Good peripheral circulation. Respiratory: Normal respiratory effort.  No retractions. Lungs CTAB. Gastrointestinal: Soft and nontender. No distention. No abdominal bruits. No CVA tenderness. Neurologic:  Normal speech and language. No gross focal neurologic deficits are appreciated. No gait instability. Skin:  Skin is warm, dry and intact. No rash noted. Erythematous nodular lesions on the right upper and lower extremities. Patient is nonfluctuant. Psychiatric: Mood and affect are normal. Speech and behavior are normal.  ____________________________________________   LABS (all labs ordered are listed, but only abnormal results are displayed)  Labs Reviewed - No data to display ____________________________________________  EKG   ____________________________________________  RADIOLOGY  No results found.  ____________________________________________   PROCEDURES  Procedure(s) performed: None  Procedures  Critical Care performed: No  ____________________________________________   INITIAL IMPRESSION / ASSESSMENT AND PLAN / ED COURSE  Pertinent labs & imaging results that were available during my care of the patient were reviewed by me and considered in my medical decision making (see chart for details).  Cellulitis secondary to abscess to the upper and lower extremities. I contacted the surgical clinic and scheduled patient for follow-up visit tomorrow morning at 845.    ____________________________________________   FINAL CLINICAL IMPRESSION(S) / ED DIAGNOSES  Final diagnoses:  Cellulitis of right leg   Cellulitis of right arm      NEW MEDICATIONS STARTED DURING THIS VISIT:  Discharge Medication List as of 06/04/2017  8:19 AM       Note:  This document was prepared using Dragon voice recognition software and may include unintentional dictation errors.    Oryon, Gary, PA-C 06/04/17 Libby Maw    Jene Every, MD 06/04/17 [phone]

## 2017-06-04 NOTE — ED Triage Notes (Signed)
Pt is here for wound check of multiple abscess on right leg and right arm

## 2017-06-04 NOTE — Telephone Encounter (Signed)
Error

## 2017-06-04 NOTE — Discharge Instructions (Signed)
Follow up tomorrow with Surgical clinic in the morning at 8:45.

## 2017-06-05 ENCOUNTER — Ambulatory Visit (INDEPENDENT_AMBULATORY_CARE_PROVIDER_SITE_OTHER): Payer: Medicaid Other | Admitting: General Surgery

## 2017-06-05 ENCOUNTER — Encounter: Payer: Self-pay | Admitting: General Surgery

## 2017-06-05 VITALS — BP 127/87 | HR 99 | Temp 97.6°F | Ht 73.0 in | Wt 162.4 lb

## 2017-06-05 DIAGNOSIS — Z22322 Carrier or suspected carrier of Methicillin resistant Staphylococcus aureus: Secondary | ICD-10-CM | POA: Diagnosis not present

## 2017-06-05 MED ORDER — CLINDAMYCIN HCL 300 MG PO CAPS: 300.0000 mg | ORAL_CAPSULE | Freq: Three times a day (TID) | ORAL | 0 refills | Status: DC

## 2017-06-05 MED ORDER — TRAMADOL HCL 50 MG PO TABS: 50.0000 mg | ORAL_TABLET | Freq: Four times a day (QID) | ORAL | 0 refills | Status: DC | PRN

## 2017-06-05 NOTE — Patient Instructions (Signed)
We have sent your Antibiotic to the pharmacy. We would like to follow up with you next week. Please see your appointment listed below. Please continue to apply warm compresses to the abscess daily and keep a dressing over the area.

## 2017-06-05 NOTE — Progress Notes (Signed)
Outpatient Surgical Follow Up  06/05/2017  Alex NeerKenneth G Mckain Jr. is an 36 y.o. male.   Chief Complaint  Patient presents with  . Follow-up    ED F/U- Abscess Right Arm-06/04/17    HPI: A 36 year old male that is well-known the surgery service returns to clinic for follow-up secondary to MRSA infection. He had a previous abscess on his right thigh that was I&D by my partner Dr. Everlene FarrierPabon. He is completely antibiotics for that. However, he noted a new area of swelling, redness, pain to his right forearm which brought him to the ER yesterday. He was evaluated and referred back to us this morning. He reports that he's had some spontaneous drainage from the area. It is been mildly erythematous but very tender. He also has complaints of pain to his right shoulder. He denies any current fevers but he's had subjective chills. He denies any nausea, vomiting, chest pain, shortness of breath, diarrhea, constipation.  Past Medical History:  Diagnosis Date  . Atrial fibrillation with rapid ventricular response (HCC)   . Cellulitis and abscess of right leg 05/15/2017  . Pain following surgery or procedure   . Status post laparoscopic appendectomy 08/05/2016    Past Surgical History:  Procedure Laterality Date  . INCISION AND DRAINAGE ABSCESS Right 05/15/2017   Procedure: INCISION AND DRAINAGE ABSCESS-RIGHT THIGH;  Surgeon: Leafy RoPabon, Diego F, MD;  Location: [facility] ORS;  Service: General;  Laterality: Right;  . LAPAROSCOPIC APPENDECTOMY N/A 07/19/2016   Procedure: APPENDECTOMY LAPAROSCOPIC;  Surgeon: Henrene DodgeJose Piscoya, MD;  Location: [facility] ORS;  Service: General;  Laterality: N/A;    History reviewed. No pertinent family history.  Social History:  reports that he has been smoking.  He has never used smokeless tobacco. He reports that he does not drink alcohol or use drugs.  Allergies:  Allergies  Allergen Reactions  . Penicillins Hives    Has patient had a PCN reaction causing immediate rash, facial/tongue/throat  swelling, SOB or lightheadedness with hypotension:no Has patient had a PCN reaction causing severe rash involving mucus membranes or skin necrosis: No Has patient had a PCN reaction that required hospitalization No Has patient had a PCN reaction occurring within the last 10 years: No If all of the above answers are "NO", then may proceed with Cephalosporin use.   Marland Kitchen. Hydrocodone Hives and Rash    Medications reviewed.    ROS A multipoint review of systems was completed, all pertinent positives and negatives are documented within the history of present illness the remainder negative   BP 127/87   Pulse 99   Temp 97.6 F (36.4 C) (Oral)   Ht 6\' 1"  (1.854 m)   Wt 73.7 kg (162 lb 6.4 oz)   BMI 21.43 kg/m   Physical Exam Gen.: No acute distress Neck: Supple and nontender Chest: Clear to auscultation Heart: Regular rhythm Abdomen: Soft and nontender in excellent skin: Right forearm with a 3 cm diameter area of erythema and induration without palpable fluctuance. Scabs to his right thigh prior infections sites without erythema or drainage. Scab 2 laceration to his right shoulder without evidence of erythema or drainage. Ultrasound of the right forearm performed which showed swelling but no obvious fluid collection.    No results found for this or any previous visit (from the past 48 hour(s)). No results found.  Assessment/Plan:  1. MRSA (methicillin resistant staph aureus) culture positive 36 year old male with known MRSA from a prior right thigh abscess which drained less than 2 weeks ago. Now with new  infection to the right forearm. No evidence of fluid requiring drainage on exam or ultrasound. Discussed using warm compresses, elevation, antibiotics. Prescription for clindamycin sent in to his pharmacy has his last culture showed that it was sensitive. Provided with Ultram for pain relief. He will follow up next week for an additional exam with Dr. Everlene Farrier.  A total of 15 minutes  was used on this encounter. 50% of each recounts coordination of care.   [name], MD FACS General Surgeon  06/05/2017,9:08 AM

## 2017-06-12 ENCOUNTER — Ambulatory Visit: Payer: Self-pay | Admitting: Surgery

## 2017-06-18 ENCOUNTER — Encounter: Payer: Self-pay | Admitting: Surgery

## 2017-06-19 HISTORY — PX: SHOULDER ARTHROSCOPY WITH OPEN ROTATOR CUFF REPAIR: SHX6092

## 2017-06-20 ENCOUNTER — Emergency Department
Admission: EM | Admit: 2017-06-20 | Discharge: 2017-06-20 | Disposition: A | Discharge status: Home / Self Care | Payer: Medicaid Other | Attending: Student in an Organized Health Care Education/Training Program | Admitting: Student in an Organized Health Care Education/Training Program

## 2017-06-20 DIAGNOSIS — Z79899 Other long term (current) drug therapy: Secondary | ICD-10-CM | POA: Insufficient documentation

## 2017-06-20 DIAGNOSIS — F17200 Nicotine dependence, unspecified, uncomplicated: Secondary | ICD-10-CM | POA: Diagnosis not present

## 2017-06-20 DIAGNOSIS — Y69 Unspecified misadventure during surgical and medical care: Secondary | ICD-10-CM | POA: Insufficient documentation

## 2017-06-20 DIAGNOSIS — Z22322 Carrier or suspected carrier of Methicillin resistant Staphylococcus aureus: Secondary | ICD-10-CM | POA: Insufficient documentation

## 2017-06-20 DIAGNOSIS — I4891 Unspecified atrial fibrillation: Secondary | ICD-10-CM | POA: Diagnosis not present

## 2017-06-20 DIAGNOSIS — M25512 Pain in left shoulder: Secondary | ICD-10-CM

## 2017-06-20 DIAGNOSIS — T819XXA Unspecified complication of procedure, initial encounter: Secondary | ICD-10-CM | POA: Diagnosis present

## 2017-06-20 DIAGNOSIS — G8918 Other acute postprocedural pain: Secondary | ICD-10-CM | POA: Diagnosis not present

## 2017-06-20 LAB — CBC WITH DIFFERENTIAL/PLATELET
Basophils Absolute: 0 10*3/uL (ref 0–0.1)
Basophils Relative: 0 %
Eosinophils Absolute: 0.1 10*3/uL (ref 0–0.7)
Eosinophils Relative: 1 %
HEMATOCRIT: 31.4 % — AB (ref 40.0–52.0)
HEMOGLOBIN: 10.9 g/dL — AB (ref 13.0–18.0)
LYMPHS ABS: 1.2 10*3/uL (ref 1.0–3.6)
LYMPHS PCT: 14 %
MCH: 32.2 pg (ref 26.0–34.0)
MCHC: 34.6 g/dL (ref 32.0–36.0)
MCV: 93.2 fL (ref 80.0–100.0)
Monocytes Absolute: 0.9 10*3/uL (ref 0.2–1.0)
Monocytes Relative: 11 %
NEUTROS PCT: 74 %
Neutro Abs: 6 10*3/uL (ref 1.4–6.5)
Platelets: 243 10*3/uL (ref 150–440)
RBC: 3.37 MIL/uL — AB (ref 4.40–5.90)
RDW: 12.8 % (ref 11.5–14.5)
WBC: 8.2 10*3/uL (ref 3.8–10.6)

## 2017-06-20 LAB — COMPREHENSIVE METABOLIC PANEL
ALT: 18 U/L (ref 17–63)
AST: 24 U/L (ref 15–41)
Albumin: 3.6 g/dL (ref 3.5–5.0)
Alkaline Phosphatase: 67 U/L (ref 38–126)
Anion gap: 6 (ref 5–15)
BUN: 17 mg/dL (ref 6–20)
CHLORIDE: 105 mmol/L (ref 101–111)
CO2: 26 mmol/L (ref 22–32)
Calcium: 8.2 mg/dL — ABNORMAL LOW (ref 8.9–10.3)
Creatinine, Ser: 1.05 mg/dL (ref 0.61–1.24)
Glucose, Bld: 110 mg/dL — ABNORMAL HIGH (ref 65–99)
POTASSIUM: 3.8 mmol/L (ref 3.5–5.1)
SODIUM: 137 mmol/L (ref 135–145)
Total Bilirubin: 0.5 mg/dL (ref 0.3–1.2)
Total Protein: 6 g/dL — ABNORMAL LOW (ref 6.5–8.1)

## 2017-06-20 MED ORDER — MORPHINE SULFATE (PF) 4 MG/ML IV SOLN
8.0000 mg | INTRAVENOUS | Status: DC | PRN
  Administered 2017-06-20: 8 mg via INTRAVENOUS
  Filled 2017-06-20: qty 2

## 2017-06-20 MED ORDER — KETOROLAC TROMETHAMINE 30 MG/ML IJ SOLN
15.0000 mg | Freq: Once | INTRAMUSCULAR | Status: AC
  Administered 2017-06-20: 15 mg via INTRAVENOUS

## 2017-06-20 MED ORDER — PROMETHAZINE HCL 25 MG/ML IJ SOLN
12.5000 mg | Freq: Four times a day (QID) | INTRAMUSCULAR | Status: DC | PRN
  Administered 2017-06-20: 12.5 mg via INTRAVENOUS
  Filled 2017-06-20: qty 1

## 2017-06-20 MED ORDER — ACETAMINOPHEN 500 MG PO TABS
ORAL_TABLET | ORAL | Status: AC
  Filled 2017-06-20: qty 2

## 2017-06-20 MED ORDER — MORPHINE SULFATE (PF) 4 MG/ML IV SOLN
4.0000 mg | Freq: Once | INTRAVENOUS | Status: AC
  Administered 2017-06-20: 4 mg via INTRAVENOUS

## 2017-06-20 MED ORDER — ACETAMINOPHEN 500 MG PO TABS
1000.0000 mg | ORAL_TABLET | Freq: Once | ORAL | Status: AC
  Administered 2017-06-20: 1000 mg via ORAL
  Filled 2017-06-20: qty 2

## 2017-06-20 MED ORDER — OXYCODONE-ACETAMINOPHEN 5-325 MG PO TABS
ORAL_TABLET | ORAL | Status: AC
  Administered 2017-06-20: 1 via ORAL
  Filled 2017-06-20: qty 1

## 2017-06-20 MED ORDER — MORPHINE SULFATE (PF) 4 MG/ML IV SOLN
INTRAVENOUS | Status: DC
Start: 2017-06-20 — End: 2017-06-21
  Filled 2017-06-20: qty 1

## 2017-06-20 MED ORDER — FENTANYL CITRATE (PF) 100 MCG/2ML IJ SOLN
50.0000 ug | INTRAMUSCULAR | Status: DC | PRN
  Administered 2017-06-20: 50 ug via INTRAVENOUS
  Filled 2017-06-20: qty 2

## 2017-06-20 MED ORDER — OXYCODONE-ACETAMINOPHEN 5-325 MG PO TABS
1.0000 | ORAL_TABLET | Freq: Once | ORAL | Status: AC
  Administered 2017-06-20: 1 via ORAL

## 2017-06-20 MED ORDER — KETOROLAC TROMETHAMINE 30 MG/ML IJ SOLN
INTRAMUSCULAR | Status: AC
  Filled 2017-06-20: qty 1

## 2017-06-20 MED ORDER — NALOXONE HCL 4 MG/0.1ML NA LIQD
1.0000 | Freq: Once | NASAL | Status: AC
  Administered 2017-06-20: 1 via NASAL
  Filled 2017-06-20: qty 8
  Filled 2017-06-20: qty 4

## 2017-06-20 NOTE — ED Provider Notes (Signed)
[facility] Emergency Department Provider Note    First MD Initiated Contact with Patient 06/20/17 2011     (approximate)  I have reviewed the triage vital signs and the nursing notes.   HISTORY  Chief Complaint Post-op Problem    HPI Alex Wilkins. is a 36 y.o. male presents today status post operative repair for rotator cuff injury of the left upper extremity with intractable postop pain. States the pain is severe and intractable. He's been taking his Roxicodone, including tripling up on his dosing for the past day without any improvement. He was discharged home with what appears to be an interscalene block that the patient cannot provide any further history. The infusion catheter has been displaced and is dripping medicine into his sling. Patient refusing to provide any additional history states "I'm in pain." review the patient's medical record it does appear that he has a history of polysubstance abuse as well as anxiety and narcotic dependence.    Past Medical History:  Diagnosis Date  . Atrial fibrillation with rapid ventricular response (HCC)   . Cellulitis and abscess of right leg 05/15/2017  . Pain following surgery or procedure   . Status post laparoscopic appendectomy 08/05/2016   History reviewed. No pertinent family history. Past Surgical History:  Procedure Laterality Date  . INCISION AND DRAINAGE ABSCESS Right 05/15/2017   Procedure: INCISION AND DRAINAGE ABSCESS-RIGHT THIGH;  Surgeon: Leafy Ro, MD;  Location: [facility] ORS;  Service: General;  Laterality: Right;  . LAPAROSCOPIC APPENDECTOMY N/A 07/19/2016   Procedure: APPENDECTOMY LAPAROSCOPIC;  Surgeon: [name], MD;  Location: [facility] ORS;  Service: General;  Laterality: N/A;   Patient Active Problem List   Diagnosis Date Noted  . MRSA (methicillin resistant staph aureus) culture positive 06/05/2017  . Cellulitis and abscess of right leg 05/15/2017  . Status post laparoscopic  appendectomy 08/05/2016  . Pain following surgery or procedure   . Atrial fibrillation with rapid ventricular response (HCC)       Prior to Admission medications   Medication Sig Start Date End Date Taking? Authorizing Provider  clindamycin (CLEOCIN) 300 MG capsule Take 1 capsule (300 mg total) by mouth 3 (three) times daily. 06/05/17   [name], MD  doxycycline (VIBRAMYCIN) 100 MG capsule Take 1 capsule by mouth 2 (two) times daily. 05/20/17   [provider]  ibuprofen (ADVIL,MOTRIN) 600 MG tablet Take 600 mg by mouth. 05/12/17   [provider]  traMADol (ULTRAM) 50 MG tablet Take 1 tablet (50 mg total) by mouth every 6 (six) hours as needed. 06/05/17   [name], MD    Allergies Penicillins and Hydrocodone    Social History Social History  Substance Use Topics  . Smoking status: Current Some Day Smoker  . Smokeless tobacco: Never Used  . Alcohol use No    Review of Systems Patient denies headaches, rhinorrhea, blurry vision, numbness, shortness of breath, chest pain, edema, cough, abdominal pain, nausea, vomiting, diarrhea, dysuria, fevers, rashes or hallucinations unless otherwise stated above in HPI. ____________________________________________   PHYSICAL EXAM:  VITAL SIGNS: Vitals:   06/20/17 1931  BP: (!) 144/88  Pulse: (!) 118  Resp: 20  Temp: (!) 97.5 F (36.4 C)  SpO2: 100%    Constitutional: Alert and oriented. In obvious discomfort Eyes: Conjunctivae are normal.  Head: Atraumatic. Nose: No congestion/rhinnorhea. Mouth/Throat: Mucous membranes are moist.   Neck: No stridor. Painless ROM.  Cardiovascular: Normal rate, regular rhythm. Grossly normal heart sounds.  Good  peripheral circulation. Respiratory: Normal respiratory effort.  No retractions. Lungs CTAB. Gastrointestinal: Soft and nontender. No distention. No abdominal bruits. No CVA tenderness. Musculoskeletal: LUE in sling and post op dressing, 2+ distal pulses,  SILT distally.  Interscalene catheter No lower extremity tenderness nor edema.  No joint effusions. Neurologic:  Normal speech and language. No gross focal neurologic deficits are appreciated. No facial droop Skin:  Skin is warm, dry and intact. No rash noted. Psychiatric: Mood and affect are normal. Speech and behavior are normal.  ____________________________________________   LABS (all labs ordered are listed, but only abnormal results are displayed)  Results for orders placed or performed during the hospital encounter of 06/20/17 (from the past 24 hour(s))  Comprehensive metabolic panel     Status: Abnormal   Collection Time: 06/20/17  7:42 PM  Result Value Ref Range   Sodium 137 135 - 145 mmol/L   Potassium 3.8 3.5 - 5.1 mmol/L   Chloride 105 101 - 111 mmol/L   CO2 26 22 - 32 mmol/L   Glucose, Bld 110 (H) 65 - 99 mg/dL   BUN 17 6 - 20 mg/dL   Creatinine, Ser 6.96 0.61 - 1.24 mg/dL   Calcium 8.2 (L) 8.9 - 10.3 mg/dL   Total Protein 6.0 (L) 6.5 - 8.1 g/dL   Albumin 3.6 3.5 - 5.0 g/dL   AST 24 15 - 41 U/L   ALT 18 17 - 63 U/L   Alkaline Phosphatase 67 38 - 126 U/L   Total Bilirubin 0.5 0.3 - 1.2 mg/dL   GFR calc non Af Amer >60 >60 mL/min   GFR calc Af Amer >60 >60 mL/min   Anion gap 6 5 - 15  CBC with Differential     Status: Abnormal   Collection Time: 06/20/17  7:42 PM  Result Value Ref Range   WBC 8.2 3.8 - 10.6 K/uL   RBC 3.37 (L) 4.40 - 5.90 MIL/uL   Hemoglobin 10.9 (L) 13.0 - 18.0 g/dL   HCT 29.5 (L) 28.4 - 13.2 %   MCV 93.2 80.0 - 100.0 fL   MCH 32.2 26.0 - 34.0 pg   MCHC 34.6 32.0 - 36.0 g/dL   RDW 44.0 10.2 - 72.5 %   Platelets 243 150 - 440 K/uL   Neutrophils Relative % 74 %   Neutro Abs 6.0 1.4 - 6.5 K/uL   Lymphocytes Relative 14 %   Lymphs Abs 1.2 1.0 - 3.6 K/uL   Monocytes Relative 11 %   Monocytes Absolute 0.9 0.2 - 1.0 K/uL   Eosinophils Relative 1 %   Eosinophils Absolute 0.1 0 - 0.7 K/uL   Basophils Relative 0 %   Basophils Absolute 0.0 0 - 0.1  K/uL   ____________________________________________  EKG My review and personal interpretation at Time: 22:45   Indication: post op pain, tachycardia Rate: 90  Rhythm: sinus Axis: normal Other: normal intervals, no stemi, no depressions ____________________________________________  RADIOLOGY   ____________________________________________   PROCEDURES  Procedure(s) performed:  Procedures    Critical Care performed: no ____________________________________________   INITIAL IMPRESSION / ASSESSMENT AND PLAN / ED COURSE  Pertinent labs & imaging results that were available during my care of the patient were reviewed by me and considered in my medical decision making (see chart for details).  DDX: postop pain, trauma, spasm, infection  Barret Esquivel. is a 36 y.o. who presents to the ED with Symptoms as described above. Patient is obviously uncomfortable. Does seem to be fairly tolerant  of narcotic medications as is not getting any relief with the IV fentanyl given in triage. We'll give*bolus of IV morphine while reassessing. It does appear that the majority of his pain is secondary to inadequate anesthesia from a displaced anesthesia catheter. Spoke with anesthesia at our facility and they do not perform this procedure. Will contact [facility] regarding the recommendations. On review of the chart it does appear that he was scheduled to have this catheter removed tomorrow. We'll try to keep patient's pain under control. Does not seem that this is consistent with infectious or recurrent trauma. Most consistent with persistent postoperative pain  Clinical Course as of Jun 21 2303  Thu Jun 20, 2017  2139 Patient's pain seems more well controlled at this point.  [PR]  2144 have consulted with orthopedics at [facility] and anesthesiology referring replacement of the interscalene infusion catheter. Service is not available until tomorrow but they would be available to see him then.  At this point he states that his pain is well controlled enough that he be appropriate for discharge home tonight with follow-up at their facility for replacement of his catheter. He does not have any evidence of any infectious process.   [PR]  2248 Patient states his pain is currently 4 out of 10 which seems appropriate given his surgery.  [PR]    Clinical Course User Index [PR] Willy Eddy, MD     ____________________________________________   FINAL CLINICAL IMPRESSION(S) / ED DIAGNOSES  Final diagnoses:  Post-op pain  Acute pain of left shoulder      NEW MEDICATIONS STARTED DURING THIS VISIT:  New Prescriptions   No medications on file     Note:  This document was prepared using Dragon voice recognition software and may include unintentional dictation errors.    Willy Eddy, MD 06/20/17 [phone]

## 2017-06-20 NOTE — ED Notes (Signed)
Refused d/c vitals.

## 2017-06-20 NOTE — ED Notes (Signed)
Patient transported to X-ray 

## 2017-06-20 NOTE — ED Triage Notes (Signed)
Pt had surgery on L arm/shoulder at [facility]. Prescribed oxycodone , last dose 2 hr PTA. States pain 10/10 sharp. Is moaning in triage. States he had surgery done  On his nerves. Shoulder immobilizer in place.

## 2017-06-20 NOTE — ED Notes (Signed)
ED Provider at bedside. 

## 2017-06-28 ENCOUNTER — Emergency Department (HOSPITAL_COMMUNITY)
Admission: EM | Admit: 2017-06-28 | Discharge: 2017-06-28 | Disposition: A | Discharge status: Home / Self Care | Payer: Medicaid Other | Attending: Emergency Medicine | Admitting: Emergency Medicine

## 2017-06-28 ENCOUNTER — Encounter (HOSPITAL_COMMUNITY): Payer: Self-pay | Admitting: *Deleted

## 2017-06-28 DIAGNOSIS — Z5321 Procedure and treatment not carried out due to patient leaving prior to being seen by health care provider: Secondary | ICD-10-CM | POA: Diagnosis not present

## 2017-06-28 DIAGNOSIS — M25519 Pain in unspecified shoulder: Secondary | ICD-10-CM | POA: Insufficient documentation

## 2017-06-28 LAB — COMPREHENSIVE METABOLIC PANEL
ALT: 22 U/L (ref 17–63)
ANION GAP: 5 (ref 5–15)
AST: 30 U/L (ref 15–41)
Albumin: 3.5 g/dL (ref 3.5–5.0)
Alkaline Phosphatase: 87 U/L (ref 38–126)
BUN: 12 mg/dL (ref 6–20)
CHLORIDE: 104 mmol/L (ref 101–111)
CO2: 26 mmol/L (ref 22–32)
Calcium: 8.7 mg/dL — ABNORMAL LOW (ref 8.9–10.3)
Creatinine, Ser: 0.96 mg/dL (ref 0.61–1.24)
Glucose, Bld: 154 mg/dL — ABNORMAL HIGH (ref 65–99)
Potassium: 3.3 mmol/L — ABNORMAL LOW (ref 3.5–5.1)
SODIUM: 135 mmol/L (ref 135–145)
Total Bilirubin: 0.8 mg/dL (ref 0.3–1.2)
Total Protein: 6.4 g/dL — ABNORMAL LOW (ref 6.5–8.1)

## 2017-06-28 LAB — CBC WITH DIFFERENTIAL/PLATELET
Basophils Absolute: 0 10*3/uL (ref 0.0–0.1)
Basophils Relative: 0 %
EOS ABS: 0.1 10*3/uL (ref 0.0–0.7)
EOS PCT: 2 %
HCT: 29.3 % — ABNORMAL LOW (ref 39.0–52.0)
Hemoglobin: 9.6 g/dL — ABNORMAL LOW (ref 13.0–17.0)
LYMPHS ABS: 1.3 10*3/uL (ref 0.7–4.0)
Lymphocytes Relative: 24 %
MCH: 31.8 pg (ref 26.0–34.0)
MCHC: 32.8 g/dL (ref 30.0–36.0)
MCV: 97 fL (ref 78.0–100.0)
MONO ABS: 0.7 10*3/uL (ref 0.1–1.0)
MONOS PCT: 12 %
Neutro Abs: 3.3 10*3/uL (ref 1.7–7.7)
Neutrophils Relative %: 62 %
PLATELETS: 320 10*3/uL (ref 150–400)
RBC: 3.02 MIL/uL — ABNORMAL LOW (ref 4.22–5.81)
RDW: 14.6 % (ref 11.5–15.5)
WBC: 5.3 10*3/uL (ref 4.0–10.5)

## 2017-06-28 NOTE — ED Triage Notes (Signed)
Patient presents stating he had surgery on his shoulder Wednesday a week ago.  Now has extensive bruising to the forearm with fingertips feeling numb.  +radial pulse, warm to touch.

## 2017-10-27 ENCOUNTER — Other Ambulatory Visit: Payer: Self-pay

## 2017-10-27 ENCOUNTER — Emergency Department: Payer: Medicaid Other

## 2017-10-27 ENCOUNTER — Emergency Department
Admission: EM | Admit: 2017-10-27 | Discharge: 2017-10-27 | Disposition: A | Discharge status: Home / Self Care | Payer: Medicaid Other | Attending: Emergency Medicine | Admitting: Emergency Medicine

## 2017-10-27 ENCOUNTER — Encounter: Payer: Self-pay | Admitting: Emergency Medicine

## 2017-10-27 DIAGNOSIS — M25511 Pain in right shoulder: Secondary | ICD-10-CM | POA: Diagnosis not present

## 2017-10-27 DIAGNOSIS — Z5321 Procedure and treatment not carried out due to patient leaving prior to being seen by health care provider: Secondary | ICD-10-CM | POA: Diagnosis not present

## 2017-10-27 NOTE — ED Triage Notes (Signed)
Pt reports right shoulder pain for 2-3 months; pt says he was moving furniture about 15 minutes ago and says he felt a popping sensation; pain worse with movement;

## 2017-10-27 NOTE — ED Notes (Signed)
States he needed to leave d/t child issues    Encouraged to stay wait explained  States he understood

## 2017-10-27 NOTE — ED Notes (Signed)
See triage note  Presents with pain to right shoulder area  States pain started after lifting furniture  No deformity noted  Pain increases with movement

## 2017-12-31 ENCOUNTER — Ambulatory Visit: Payer: Medicaid Other | Admitting: Family Medicine

## 2018-01-10 ENCOUNTER — Emergency Department: Payer: Medicaid Other

## 2018-01-10 ENCOUNTER — Emergency Department
Admission: EM | Admit: 2018-01-10 | Discharge: 2018-01-10 | Disposition: A | Discharge status: Home / Self Care | Payer: Medicaid Other | Attending: Emergency Medicine | Admitting: Emergency Medicine

## 2018-01-10 ENCOUNTER — Encounter: Payer: Self-pay | Admitting: Emergency Medicine

## 2018-01-10 DIAGNOSIS — R079 Chest pain, unspecified: Secondary | ICD-10-CM | POA: Diagnosis present

## 2018-01-10 DIAGNOSIS — G8929 Other chronic pain: Secondary | ICD-10-CM | POA: Diagnosis not present

## 2018-01-10 DIAGNOSIS — F17210 Nicotine dependence, cigarettes, uncomplicated: Secondary | ICD-10-CM | POA: Diagnosis not present

## 2018-01-10 DIAGNOSIS — M25512 Pain in left shoulder: Secondary | ICD-10-CM | POA: Diagnosis not present

## 2018-01-10 LAB — BASIC METABOLIC PANEL
Anion gap: 5 (ref 5–15)
BUN: 15 mg/dL (ref 6–20)
CHLORIDE: 102 mmol/L (ref 101–111)
CO2: 30 mmol/L (ref 22–32)
Calcium: 8.7 mg/dL — ABNORMAL LOW (ref 8.9–10.3)
Creatinine, Ser: 1.16 mg/dL (ref 0.61–1.24)
GFR calc Af Amer: >60 mL/min (ref >60)
GLUCOSE: 116 mg/dL — AB (ref 65–99)
Potassium: 4.5 mmol/L (ref 3.5–5.1)
Sodium: 137 mmol/L (ref 135–145)

## 2018-01-10 LAB — CBC
HEMATOCRIT: 43.2 % (ref 40.0–52.0)
Hemoglobin: 15.3 g/dL (ref 13.0–18.0)
MCH: 32.9 pg (ref 26.0–34.0)
MCHC: 35.4 g/dL (ref 32.0–36.0)
MCV: 92.9 fL (ref 80.0–100.0)
Platelets: 248 10*3/uL (ref 150–440)
RBC: 4.65 MIL/uL (ref 4.40–5.90)
RDW: 12.4 % (ref 11.5–14.5)
WBC: 7.3 10*3/uL (ref 3.8–10.6)

## 2018-01-10 LAB — TROPONIN I: Troponin I: <0.03 ng/mL (ref <0.03)

## 2018-01-10 MED ORDER — OXYCODONE-ACETAMINOPHEN 5-325 MG PO TABS: 1.0000 | ORAL_TABLET | ORAL | 0 refills | Status: DC | PRN

## 2018-01-10 MED ORDER — OXYCODONE-ACETAMINOPHEN 5-325 MG PO TABS
1.0000 | ORAL_TABLET | Freq: Once | ORAL | Status: AC
  Administered 2018-01-10: 1 via ORAL
  Filled 2018-01-10: qty 1

## 2018-01-10 MED ORDER — IBUPROFEN 600 MG PO TABS: 600.0000 mg | ORAL_TABLET | Freq: Three times a day (TID) | ORAL | 0 refills | Status: DC | PRN

## 2018-01-10 MED ORDER — IBUPROFEN 800 MG PO TABS
800.0000 mg | ORAL_TABLET | Freq: Once | ORAL | Status: AC
  Administered 2018-01-10: 800 mg via ORAL
  Filled 2018-01-10: qty 1
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
## 2018-01-10 NOTE — ED Notes (Signed)
NAD noted at time of D/C. Pt denies questions or concerns. Pt ambulatory to the lobby at this time.  

## 2018-01-10 NOTE — ED Triage Notes (Signed)
Pt c/o left shoulder pain with tightness across left side of chest x1 week. Pt denies n/v.
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
## 2018-01-10 NOTE — Discharge Instructions (Signed)
Please take your pain medication as needed for severe symptoms and make an appointment to follow-up with orthopedic surgery here in [facility] [place] for [place].  Return to the emergency department sooner for any concerns whatsoever.   It was a pleasure to take care of you today, and thank you for coming to our emergency department.  If you have any questions or concerns before leaving please ask the nurse to grab me and I'm more than happy to go through your aftercare instructions again.  If you were prescribed any opioid pain medication today such as Norco, Vicodin, Percocet, morphine, hydrocodone, or oxycodone please make sure you do not drive when you are taking this medication as it can alter your ability to drive safely.  If you have any concerns once you are home that you are not improving or are in fact getting worse before you can make it to your follow-up appointment, please do not hesitate to call 911 and come back for further evaluation.  Merrily BrittleNeil Holy [place], MD  Results for orders placed or performed during the hospital encounter of 01/10/18  Basic metabolic panel  Result Value Ref Range   Sodium 137 135 - 145 mmol/L   Potassium 4.5 3.5 - 5.1 mmol/L   Chloride 102 101 - 111 mmol/L   CO2 30 22 - 32 mmol/L   Glucose, Bld 116 (H) 65 - 99 mg/dL   BUN 15 6 - 20 mg/dL   Creatinine, Ser 1.611.16 0.61 - 1.24 mg/dL   Calcium 8.7 (L) 8.9 - 10.3 mg/dL   GFR calc non Af Amer >60 >60 mL/min   GFR calc Af Amer >60 >60 mL/min   Anion gap 5 5 - 15  CBC  Result Value Ref Range   WBC 7.3 3.8 - 10.6 K/uL   RBC 4.65 4.40 - 5.90 MIL/uL   Hemoglobin 15.3 13.0 - 18.0 g/dL   HCT 09.643.2 04.540.0 - 40.952.0 %   MCV 92.9 80.0 - 100.0 fL   MCH 32.9 26.0 - 34.0 pg   MCHC 35.4 32.0 - 36.0 g/dL   RDW 81.112.4 91.411.5 - 78.214.5 %   Platelets 248 150 - 440 K/uL  Troponin I  Result Value Ref Range   Troponin I <0.03 <0.03 ng/mL   Dg Chest 2 View  Result Date: 01/10/2018 CLINICAL DATA:  Left shoulder pain and tightness across  the left chest x1 week. EXAM: CHEST - 2 VIEW COMPARISON:  01/28/2017 CXR on same day left shoulder radiographs FINDINGS: The heart size and mediastinal contours are within normal limits. Both lungs are clear. No pneumothorax. No effusion or pulmonary edema. Subtle soft tissue density in the left axillary pouch appears to represent posttraumatic change off the humeral head. IMPRESSION: No active cardiopulmonary disease. Ossific density off the included humeral head projecting over the left axillary pouch may reflect posttraumatic change. Electronically Signed   By: Tollie Ethavid  Kwon M.D.   On: 01/10/2018 20:15   Dg Shoulder Left  Result Date: 01/10/2018 CLINICAL DATA:  Left shoulder pain and tightness across the chest x1 week. EXAM: LEFT SHOULDER - 2+ VIEW COMPARISON:  None. FINDINGS: Posttraumatic deformity of the anterior aspect of the humeral head, best seen on the axillary view. This likely accounts for the soft tissue densities projecting over the humeral neck on the external rotation view. Given it somewhat sclerotic appearance, findings are likely remote. No joint dislocation. [facility] and glenohumeral joints are maintained. The adjacent ribs and lung are nonacute. IMPRESSION: Remote appearing fracture deformity of the humeral  head, best seen on the axillary view. Correlate with patient clinical history. Electronically Signed   By: Tollie Eth M.D.   On: 01/10/2018 20:21

## 2018-03-05 ENCOUNTER — Other Ambulatory Visit: Payer: Self-pay

## 2018-03-05 ENCOUNTER — Emergency Department
Admission: EM | Admit: 2018-03-05 | Discharge: 2018-03-05 | Disposition: A | Discharge status: Home / Self Care | Payer: Medicaid Other | Attending: Emergency Medicine | Admitting: Emergency Medicine

## 2018-03-05 ENCOUNTER — Encounter: Payer: Self-pay | Admitting: Emergency Medicine

## 2018-03-05 DIAGNOSIS — Y999 Unspecified external cause status: Secondary | ICD-10-CM | POA: Diagnosis not present

## 2018-03-05 DIAGNOSIS — Y929 Unspecified place or not applicable: Secondary | ICD-10-CM | POA: Insufficient documentation

## 2018-03-05 DIAGNOSIS — Y939 Activity, unspecified: Secondary | ICD-10-CM | POA: Insufficient documentation

## 2018-03-05 DIAGNOSIS — Z5321 Procedure and treatment not carried out due to patient leaving prior to being seen by health care provider: Secondary | ICD-10-CM | POA: Diagnosis not present

## 2018-03-05 DIAGNOSIS — S30862A Insect bite (nonvenomous) of penis, initial encounter: Secondary | ICD-10-CM | POA: Diagnosis not present

## 2018-03-05 DIAGNOSIS — W57XXXA Bitten or stung by nonvenomous insect and other nonvenomous arthropods, initial encounter: Secondary | ICD-10-CM | POA: Diagnosis not present

## 2018-03-05 NOTE — ED Provider Notes (Signed)
Patient left without being seen.   Pia MauWoods, Jaylen Claude PathforkM, PA-C 03/05/18 2204    Arnaldo NatalMalinda, Paul F, MD 03/05/18 2242

## 2018-03-05 NOTE — ED Notes (Signed)
Pt not in rm at this time.

## 2018-03-05 NOTE — ED Notes (Signed)
Pt continues to not be in rm at this time, not in lobby either, Pia MauJaclyn Woods aware

## 2018-03-05 NOTE — ED Triage Notes (Signed)
Patient ambulatory to triage with steady gait, without difficulty or distress noted; pt reports tick to penis x wk, unable to remove

## 2018-03-20 ENCOUNTER — Emergency Department
Admission: EM | Admit: 2018-03-20 | Discharge: 2018-03-20 | Disposition: A | Discharge status: Home / Self Care | Payer: Medicaid Other | Attending: Emergency Medicine | Admitting: Emergency Medicine

## 2018-03-20 ENCOUNTER — Encounter: Payer: Self-pay | Admitting: Emergency Medicine

## 2018-03-20 DIAGNOSIS — N489 Disorder of penis, unspecified: Secondary | ICD-10-CM | POA: Diagnosis not present

## 2018-03-20 DIAGNOSIS — Z5321 Procedure and treatment not carried out due to patient leaving prior to being seen by health care provider: Secondary | ICD-10-CM | POA: Diagnosis not present

## 2018-03-20 LAB — URINALYSIS, COMPLETE (UACMP) WITH MICROSCOPIC
BACTERIA UA: NONE SEEN
Bilirubin Urine: NEGATIVE
Glucose, UA: NEGATIVE mg/dL
Hgb urine dipstick: NEGATIVE
Ketones, ur: NEGATIVE mg/dL
LEUKOCYTES UA: NEGATIVE
Nitrite: NEGATIVE
Protein, ur: NEGATIVE mg/dL
SPECIFIC GRAVITY, URINE: 1.029 (ref 1.005–1.030)
SQUAMOUS EPITHELIAL / LPF: NONE SEEN (ref 0–5)
pH: 5 (ref 5.0–8.0)

## 2018-03-20 LAB — URINE DRUG SCREEN, QUALITATIVE (ARMC ONLY)
Amphetamines, Ur Screen: POSITIVE — AB
BENZODIAZEPINE, UR SCRN: NOT DETECTED
CANNABINOID 50 NG, UR ~~LOC~~: POSITIVE — AB
Cocaine Metabolite,Ur ~~LOC~~: NOT DETECTED
MDMA (Ecstasy)Ur Screen: NOT DETECTED
METHADONE SCREEN, URINE: NOT DETECTED
Opiate, Ur Screen: NOT DETECTED
Phencyclidine (PCP) Ur S: NOT DETECTED
TRICYCLIC, UR SCREEN: NOT DETECTED

## 2018-03-20 LAB — CHLAMYDIA/NGC RT PCR (ARMC ONLY)
Chlamydia Tr: NOT DETECTED
N GONORRHOEAE: NOT DETECTED

## 2018-03-20 NOTE — ED Triage Notes (Signed)
Pt ambulatory to triage with steady gait, no distress noted. Pt sts. "I have something under the head of my penis. Its like a worm or something. It wraps around my groin and feels like its shooting spurs into my pecker." Pt reports this has been going on x2 weeks.

## 2018-05-02 ENCOUNTER — Emergency Department
Admission: EM | Admit: 2018-05-02 | Discharge: 2018-05-02 | Discharge status: Against Medical Advice | Payer: Medicaid Other | Attending: Emergency Medicine | Admitting: Emergency Medicine

## 2018-05-02 ENCOUNTER — Other Ambulatory Visit: Payer: Self-pay

## 2018-05-02 DIAGNOSIS — Z5321 Procedure and treatment not carried out due to patient leaving prior to being seen by health care provider: Secondary | ICD-10-CM | POA: Diagnosis not present

## 2018-05-02 DIAGNOSIS — R21 Rash and other nonspecific skin eruption: Secondary | ICD-10-CM | POA: Insufficient documentation

## 2018-05-02 NOTE — ED Triage Notes (Signed)
Pt in with co rash to skin x 1 month. Went to pmd and was given doxycycline for the same. States areas are worse, red open areas noted to arms and legs.
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
## 2018-05-02 NOTE — ED Provider Notes (Signed)
-----------------------------------------   3:10 AM on 05/02/2018 -----------------------------------------  Patient eloped from room prior to my interview and examination.   Irean HongSung, Jade J, MD 05/02/18 [id number]

## 2018-06-23 ENCOUNTER — Emergency Department (HOSPITAL_COMMUNITY)
Admission: EM | Admit: 2018-06-23 | Discharge: 2018-06-23 | Disposition: A | Discharge status: Home / Self Care | Payer: Medicaid Other | Attending: Emergency Medicine | Admitting: Emergency Medicine

## 2018-06-23 ENCOUNTER — Encounter (HOSPITAL_COMMUNITY): Payer: Self-pay | Admitting: Emergency Medicine

## 2018-06-23 DIAGNOSIS — Z79899 Other long term (current) drug therapy: Secondary | ICD-10-CM | POA: Insufficient documentation

## 2018-06-23 DIAGNOSIS — L02811 Cutaneous abscess of head [any part, except face]: Secondary | ICD-10-CM | POA: Insufficient documentation

## 2018-06-23 DIAGNOSIS — L0291 Cutaneous abscess, unspecified: Secondary | ICD-10-CM

## 2018-06-23 DIAGNOSIS — F17210 Nicotine dependence, cigarettes, uncomplicated: Secondary | ICD-10-CM | POA: Diagnosis not present

## 2018-06-23 MED ORDER — BACITRACIN ZINC 500 UNIT/GM EX OINT: 1.0000 "application " | TOPICAL_OINTMENT | Freq: Two times a day (BID) | CUTANEOUS | 0 refills | Status: DC

## 2018-06-23 MED ORDER — LIDOCAINE-EPINEPHRINE (PF) 2 %-1:200000 IJ SOLN
10.0000 mL | Freq: Once | INTRAMUSCULAR | Status: AC
  Administered 2018-06-23: 10 mL
  Filled 2018-06-23: qty 20

## 2018-06-23 MED ORDER — IBUPROFEN 400 MG PO TABS
600.0000 mg | ORAL_TABLET | Freq: Once | ORAL | Status: AC
  Administered 2018-06-23: 600 mg via ORAL
  Filled 2018-06-23: qty 1

## 2018-06-23 MED ORDER — SULFAMETHOXAZOLE-TRIMETHOPRIM 800-160 MG PO TABS: 1.0000 | ORAL_TABLET | Freq: Two times a day (BID) | ORAL | 0 refills | Status: DC

## 2018-06-23 NOTE — Discharge Instructions (Addendum)
Please read and follow all provided instructions.  You were seen here today for an Abscess . For this, an incision and drainage (aka an I&D) to the affected area was done today. An I&D is a surgical procedure to open and drain a fluid-filled sac that may be filled with pus, mucus, or blood. Examples of fluid-filled sacs that may need surgical drainage include cysts, skin infections (abscesses), and red lumps that develop from a ruptured cyst or a small abscess (boils).  Home instructions  1. Medications: Bactrim. Please take all of your antibiotics until finished!   You may develop abdominal discomfort or diarrhea from the antibiotic.  You may help offset this with probiotics which you can buy or get in yogurt. Do not eat or take the probiotics until 2 hours after your antibiotic. Do not take your medicine if develop an itchy rash, swelling in your mouth or lips, or difficulty breathing.   2. Treatment: Keep wound clean and dry. Apply warm compresses to the area throughout the day. It will continue to drain over the follow days.  For pain control you may take: 800mg  of ibuprofen (that is usually four 200mg  over the counter pills) up to 3 times a day (please take with food) and acetaminophen 975mg  (this is 3 normal strength, 325mg , over the counter pills) up to four times a day. Please do not take more than this. Do not drink alcohol or combine with other medications that have acetaminophen as an ingredient (Read the labels!).    Follow Up:  Follow-up with your Primary Care Provider or [facility] in 2 days for wound recheck.  Return to emergency department for emergent changing or worsening symptoms.  Additional information: Stop soaking your arm in bleach. Avoid scratching and picking at the area. Apply bacitracin to the wounds and keep your appointment with your primary care doctor and dermatologist.  Return instructions:  Return to the Emergency Department if you have: Fever You have  more redness, swelling, or pain around your incision.  Your incision feels warm to touch Redness of the skin that moves away from the affected area, especially if it streaks away from the affected area  The area where the incision and drainage occurred becomes numb or it tingles. Any other emergent concerns  Your vital signs today were: BP (!) 128/96 (BP Location: Right Arm)    Pulse 99    Temp 98.9 F (37.2 C) (Oral)    Resp 18    SpO2 100%  If your blood pressure (BP) was elevated above 135/85 this visit, please have this repeated by your doctor within one month. ---------------

## 2018-06-23 NOTE — ED Triage Notes (Signed)
Pt to ER for posterior head abscess, reports appeared last Thursday. Reddened, indurated, and painful. No fever. Pt ambulatory. VSS. Reports has not been able to get much drainage.

## 2018-06-23 NOTE — ED Provider Notes (Signed)
[facility] EMERGENCY DEPARTMENT Provider Note   CSN: [id number] Arrival date & time: 06/23/18  1547     History   Chief Complaint Chief Complaint  Patient presents with  . Abscess    HPI Alex Lynk. is a 37 y.o. male with a history of MRSA presents emergency room today for abscess.  Patient reports 4 days ago he began having an abscess developed on his right posterior occiput.  He notes that he has had some drainage from the area since closed..  Pain: Rates pain level is 8/10.  Denies any associated fever, chills, nausea or vomiting.  Patient reports no history of immunocompromise.  Denies history of diabetes, IV drug use, chronic steroid use or HIV.  Patient also reports that he has scabs on his left arm had been popping up over the last several months.  He notes he has been seen by primary care doctor dermatologist for this and is currently on 100 mg of doxycycline daily.  He reports the area is scabbing he scratches at them to make them bleed. No fever, chills, N/V. No changes in lotions/soaps/detergents, exposure to animal or plant irritants, and denies swelling or purulent discharge. No new medications prior to onset of symptoms. No recent travel. No recent tick bites. No involvement to palms/soles or between webspaces. No oral involvement.   HPI  Past Medical History:  Diagnosis Date  . Atrial fibrillation with rapid ventricular response (HCC)   . Cellulitis and abscess of right leg 05/15/2017  . Pain following surgery or procedure   . Status post laparoscopic appendectomy 08/05/2016    Patient Active Problem List   Diagnosis Date Noted  . MRSA (methicillin resistant staph aureus) culture positive 06/05/2017  . Cellulitis and abscess of right leg 05/15/2017  . Status post laparoscopic appendectomy 08/05/2016  . Pain following surgery or procedure   . Atrial fibrillation with rapid ventricular response [facility])     Past Surgical History:  Procedure  Laterality Date  . APPENDECTOMY    . INCISION AND DRAINAGE ABSCESS Right 05/15/2017   Procedure: INCISION AND DRAINAGE ABSCESS-RIGHT THIGH;  Surgeon: Leafy Ro, MD;  Location: [facility] ORS;  Service: General;  Laterality: Right;  . LAPAROSCOPIC APPENDECTOMY N/A 07/19/2016   Procedure: APPENDECTOMY LAPAROSCOPIC;  Surgeon: [name], MD;  Location: [facility] ORS;  Service: General;  Laterality: N/A;        Home Medications    Prior to Admission medications   Medication Sig Start Date End Date Taking? Authorizing Provider  ibuprofen (ADVIL,MOTRIN) 600 MG tablet Take 1 tablet (600 mg total) by mouth every 8 (eight) hours as needed. 01/10/18   Merrily Brittle, MD  oxyCODONE-acetaminophen (PERCOCET/ROXICET) 5-325 MG tablet Take 1 tablet by mouth every 4 (four) hours as needed for severe pain. 01/10/18   Merrily Brittle, MD    Family History History reviewed. No pertinent family history.  Social History Social History   Tobacco Use  . Smoking status: Current Some Day Smoker    Types: Cigarettes  . Smokeless tobacco: Never Used  Substance Use Topics  . Alcohol use: No  . Drug use: No     Allergies   Penicillins and Hydrocodone   Review of Systems Review of Systems  All other systems reviewed and are negative.    Physical Exam Updated Vital Signs BP (!) 128/96 (BP Location: Right Arm)   Pulse 99   Temp 98.9 F (37.2 C) (Oral)   Resp 18   SpO2 100%  Physical Exam  Constitutional: He appears well-developed and well-nourished.  HENT:  Head: Normocephalic and atraumatic.    Right Ear: External ear normal.  Left Ear: External ear normal.  Nose: Nose normal.  Mouth/Throat: Uvula is midline, oropharynx is clear and moist and mucous membranes are normal. No tonsillar exudate.  No oral lesions.   Eyes: Pupils are equal, round, and reactive to light. Right eye exhibits no discharge. Left eye exhibits no discharge. No scleral icterus.  Neck: Trachea normal. Neck supple. No  spinous process tenderness present. No neck rigidity. Normal range of motion present.  No nuchal rigidity or meningismus  Cardiovascular: Normal rate, regular rhythm and intact distal pulses.  No murmur heard. Pulses:      Radial pulses are 2+ on the right side, and 2+ on the left side.       Dorsalis pedis pulses are 2+ on the right side, and 2+ on the left side.       Posterior tibial pulses are 2+ on the right side, and 2+ on the left side.  No lower extremity swelling or edema. Calves symmetric in size bilaterally.  Pulmonary/Chest: Effort normal and breath sounds normal. He exhibits no tenderness.  Abdominal: Soft. Bowel sounds are normal. There is no tenderness. There is no rebound and no guarding.  Musculoskeletal: He exhibits no edema.  Lymphadenopathy:    He has no cervical adenopathy.  Neurological: He is alert.  Skin: Skin is warm and dry. No rash noted. He is not diaphoretic.  Scabbing of the left arm in multiple areas with noted excoriations as seen below. No blisters, no pustules, no warmth, no draining sinus tracts, no superficial abscesses, no bullous impetigo, no vesicles, no desquamation, no target lesions with dusky purpura or a central bulla. Not tender to touch.  Psychiatric: He has a normal mood and affect.  Nursing note and vitals reviewed.      ED Treatments / Results  Labs (all labs ordered are listed, but only abnormal results are displayed) Labs Reviewed - No data to display  EKG None  Radiology No results found.  Procedures .Marland KitchenIncision and Drainage Date/Time: 06/23/2018 6:43 PM Performed by: Jacinto Halim, PA-C Authorized by: Jacinto Halim, PA-C   Consent:    Consent obtained:  Verbal   Consent given by:  Patient   Risks discussed:  Bleeding, damage to other organs, infection, incomplete drainage and pain   Alternatives discussed:  No treatment Location:    Type:  Abscess   Size:  4   Location:  Head   Head location:  Scalp    (including critical care time)   Medications Ordered in ED Medications  ibuprofen (ADVIL,MOTRIN) tablet 600 mg (has no administration in time range)  lidocaine-EPINEPHrine (XYLOCAINE W/EPI) 2 %-1:200000 (PF) injection 10 mL (10 mLs Infiltration Given 06/23/18 1731)     Initial Impression / Assessment and Plan / ED Course  I have reviewed the triage vital signs and the nursing notes.  Pertinent labs & imaging results that were available during my care of the patient were reviewed by me and considered in my medical decision making (see chart for details).     37 y.o. male with no significant history of immunosuppression with prior hx of mrsa presenting for abscess.  Patient with skin abscess amenable to incision and drainage.  Abscess was not large enough to warrant packing or drain,  wound recheck in 2 days. Encouraged home warm soaks and flushing.  Bactrim rx'd.   Patient  with wounds to left arm. He denies IVDU, fever, DM, chronic steroid use. Patient being covered with bactrim.  No other lesions found.  No evidence of abscess.  I discussed the patient to avoid soaking his arm and chlorine bleach as this is likely making his symptoms worse.  He has open wounds that will not benefit from hydrocortisone.  Will give bacitracin.  No evidence of SJS, TEN, TSS other life-threatening conditions.  Recommend follow-up with PCP and his dermatologist is currently seen.  Return precautions discussed.  Patient appears safe for discharge.  Final Clinical Impressions(s) / ED Diagnoses   Final diagnoses:  Abscess    ED Discharge Orders         Ordered    sulfamethoxazole-trimethoprim (BACTRIM DS,SEPTRA DS) 800-160 MG tablet  2 times daily     06/23/18 1840    bacitracin ointment  2 times daily     06/23/18 1842           Princella Pellegrini 06/23/18 Helmut Muster, MD 06/24/18 1353

## 2018-09-07 ENCOUNTER — Other Ambulatory Visit: Payer: Self-pay

## 2018-09-07 ENCOUNTER — Inpatient Hospital Stay (HOSPITAL_COMMUNITY)
Admission: EM | Admit: 2018-09-07 | Discharge: 2018-09-09 | DRG: 603 | Disposition: A | Discharge status: Home / Self Care | Payer: Medicaid Other | Attending: Internal Medicine | Admitting: Internal Medicine

## 2018-09-07 ENCOUNTER — Emergency Department (HOSPITAL_COMMUNITY): Payer: Medicaid Other

## 2018-09-07 ENCOUNTER — Encounter (HOSPITAL_COMMUNITY): Payer: Self-pay | Admitting: Emergency Medicine

## 2018-09-07 DIAGNOSIS — S61300A Unspecified open wound of right index finger with damage to nail, initial encounter: Secondary | ICD-10-CM | POA: Diagnosis not present

## 2018-09-07 DIAGNOSIS — L608 Other nail disorders: Secondary | ICD-10-CM | POA: Diagnosis not present

## 2018-09-07 DIAGNOSIS — F17210 Nicotine dependence, cigarettes, uncomplicated: Secondary | ICD-10-CM | POA: Diagnosis present

## 2018-09-07 DIAGNOSIS — F1011 Alcohol abuse, in remission: Secondary | ICD-10-CM

## 2018-09-07 DIAGNOSIS — Z872 Personal history of diseases of the skin and subcutaneous tissue: Secondary | ICD-10-CM

## 2018-09-07 DIAGNOSIS — L03011 Cellulitis of right finger: Secondary | ICD-10-CM | POA: Diagnosis present

## 2018-09-07 DIAGNOSIS — L0291 Cutaneous abscess, unspecified: Secondary | ICD-10-CM | POA: Diagnosis present

## 2018-09-07 DIAGNOSIS — B9561 Methicillin susceptible Staphylococcus aureus infection as the cause of diseases classified elsewhere: Secondary | ICD-10-CM | POA: Diagnosis not present

## 2018-09-07 DIAGNOSIS — I4891 Unspecified atrial fibrillation: Secondary | ICD-10-CM | POA: Diagnosis present

## 2018-09-07 DIAGNOSIS — X58XXXA Exposure to other specified factors, initial encounter: Secondary | ICD-10-CM | POA: Diagnosis not present

## 2018-09-07 DIAGNOSIS — M20091 Other deformity of right finger(s): Secondary | ICD-10-CM | POA: Diagnosis not present

## 2018-09-07 DIAGNOSIS — B9562 Methicillin resistant Staphylococcus aureus infection as the cause of diseases classified elsewhere: Secondary | ICD-10-CM | POA: Diagnosis present

## 2018-09-07 DIAGNOSIS — Z880 Allergy status to penicillin: Secondary | ICD-10-CM | POA: Diagnosis not present

## 2018-09-07 DIAGNOSIS — S60940A Unspecified superficial injury of right index finger, initial encounter: Secondary | ICD-10-CM | POA: Diagnosis not present

## 2018-09-07 DIAGNOSIS — Z8614 Personal history of Methicillin resistant Staphylococcus aureus infection: Secondary | ICD-10-CM

## 2018-09-07 DIAGNOSIS — R21 Rash and other nonspecific skin eruption: Secondary | ICD-10-CM

## 2018-09-07 DIAGNOSIS — Z22322 Carrier or suspected carrier of Methicillin resistant Staphylococcus aureus: Secondary | ICD-10-CM | POA: Diagnosis not present

## 2018-09-07 DIAGNOSIS — L03113 Cellulitis of right upper limb: Secondary | ICD-10-CM

## 2018-09-07 DIAGNOSIS — L02511 Cutaneous abscess of right hand: Secondary | ICD-10-CM | POA: Diagnosis present

## 2018-09-07 DIAGNOSIS — Z885 Allergy status to narcotic agent status: Secondary | ICD-10-CM | POA: Diagnosis not present

## 2018-09-07 DIAGNOSIS — M71122 Other infective bursitis, left elbow: Secondary | ICD-10-CM

## 2018-09-07 HISTORY — DX: Carrier or suspected carrier of methicillin resistant Staphylococcus aureus: Z22.322

## 2018-09-07 LAB — CBC WITH DIFFERENTIAL/PLATELET
Abs Immature Granulocytes: 0.02 10*3/uL (ref 0.00–0.07)
Basophils Absolute: 0 10*3/uL (ref 0.0–0.1)
Basophils Relative: 0 %
Eosinophils Absolute: 0.1 10*3/uL (ref 0.0–0.5)
Eosinophils Relative: 1 %
HCT: 43.6 % (ref 39.0–52.0)
Hemoglobin: 13.8 g/dL (ref 13.0–17.0)
Immature Granulocytes: 0 %
Lymphocytes Relative: 14 %
Lymphs Abs: 1.4 10*3/uL (ref 0.7–4.0)
MCH: 29.8 pg (ref 26.0–34.0)
MCHC: 31.7 g/dL (ref 30.0–36.0)
MCV: 94.2 fL (ref 80.0–100.0)
Monocytes Absolute: 0.9 10*3/uL (ref 0.1–1.0)
Monocytes Relative: 9 %
Neutro Abs: 7.7 10*3/uL (ref 1.7–7.7)
Neutrophils Relative %: 76 %
Platelets: 307 10*3/uL (ref 150–400)
RBC: 4.63 MIL/uL (ref 4.22–5.81)
RDW: 12.2 % (ref 11.5–15.5)
WBC: 10.1 10*3/uL (ref 4.0–10.5)
nRBC: 0 % (ref 0.0–0.2)

## 2018-09-07 LAB — BASIC METABOLIC PANEL
Anion gap: 12 (ref 5–15)
BUN: 9 mg/dL (ref 6–20)
CO2: 26 mmol/L (ref 22–32)
Calcium: 9 mg/dL (ref 8.9–10.3)
Chloride: 102 mmol/L (ref 98–111)
Creatinine, Ser: 0.94 mg/dL (ref 0.61–1.24)
GFR calc Af Amer: >60 mL/min (ref >60)
GFR calc non Af Amer: >60 mL/min (ref >60)
Glucose, Bld: 104 mg/dL — ABNORMAL HIGH (ref 70–99)
Potassium: 3.9 mmol/L (ref 3.5–5.1)
Sodium: 140 mmol/L (ref 135–145)

## 2018-09-07 LAB — CBG MONITORING, ED: Glucose-Capillary: 91 mg/dL (ref 70–99)

## 2018-09-07 LAB — MRSA PCR SCREENING: MRSA by PCR: POSITIVE — AB

## 2018-09-07 MED ORDER — OXYCODONE-ACETAMINOPHEN 5-325 MG PO TABS
1.0000 | ORAL_TABLET | Freq: Once | ORAL | Status: AC
  Administered 2018-09-07: 1 via ORAL
  Filled 2018-09-07: qty 1

## 2018-09-07 MED ORDER — ONDANSETRON HCL 4 MG/2ML IJ SOLN
INTRAMUSCULAR | Status: AC
  Filled 2018-09-07: qty 2

## 2018-09-07 MED ORDER — LIDOCAINE HCL (PF) 1 % IJ SOLN
30.0000 mL | Freq: Once | INTRAMUSCULAR | Status: AC
  Administered 2018-09-07: 30 mL
  Filled 2018-09-07: qty 30

## 2018-09-07 MED ORDER — SENNOSIDES-DOCUSATE SODIUM 8.6-50 MG PO TABS: 1.0000 | ORAL_TABLET | Freq: Every evening | ORAL | Status: DC | PRN

## 2018-09-07 MED ORDER — SODIUM CHLORIDE 0.9 % IV SOLN
INTRAVENOUS | Status: AC
  Administered 2018-09-07: 10:00:00 via INTRAVENOUS

## 2018-09-07 MED ORDER — PROMETHAZINE HCL 25 MG PO TABS: 12.5000 mg | ORAL_TABLET | Freq: Four times a day (QID) | ORAL | Status: DC | PRN

## 2018-09-07 MED ORDER — ONDANSETRON HCL 4 MG/2ML IJ SOLN
4.0000 mg | Freq: Once | INTRAMUSCULAR | Status: AC
  Administered 2018-09-07: 4 mg via INTRAVENOUS

## 2018-09-07 MED ORDER — OXYCODONE-ACETAMINOPHEN 5-325 MG PO TABS
1.0000 | ORAL_TABLET | Freq: Four times a day (QID) | ORAL | Status: DC | PRN
  Administered 2018-09-07: 1 via ORAL
  Administered 2018-09-08 – 2018-09-09 (×6): 2 via ORAL
  Filled 2018-09-07 (×7): qty 2

## 2018-09-07 MED ORDER — VANCOMYCIN HCL 10 G IV SOLR
1500.0000 mg | Freq: Once | INTRAVENOUS | Status: AC
  Administered 2018-09-07: 1500 mg via INTRAVENOUS
  Filled 2018-09-07: qty 1500

## 2018-09-07 MED ORDER — ACETAMINOPHEN 325 MG PO TABS
650.0000 mg | ORAL_TABLET | Freq: Four times a day (QID) | ORAL | Status: DC | PRN
  Administered 2018-09-07: 650 mg via ORAL
  Filled 2018-09-07: qty 2

## 2018-09-07 MED ORDER — ENOXAPARIN SODIUM 40 MG/0.4ML ~~LOC~~ SOLN
40.0000 mg | SUBCUTANEOUS | Status: DC
  Administered 2018-09-07 – 2018-09-08 (×2): 40 mg via SUBCUTANEOUS
  Filled 2018-09-07 (×3): qty 0.4

## 2018-09-07 MED ORDER — ACETAMINOPHEN 650 MG RE SUPP: 650.0000 mg | Freq: Four times a day (QID) | RECTAL | Status: DC | PRN

## 2018-09-07 MED ORDER — DIPHENHYDRAMINE HCL 50 MG/ML IJ SOLN
25.0000 mg | Freq: Once | INTRAMUSCULAR | Status: AC
  Administered 2018-09-07: 25 mg via INTRAVENOUS
  Filled 2018-09-07: qty 1

## 2018-09-07 MED ORDER — MORPHINE SULFATE (PF) 4 MG/ML IV SOLN
4.0000 mg | Freq: Once | INTRAVENOUS | Status: AC
  Administered 2018-09-07: 4 mg via INTRAVENOUS
  Filled 2018-09-07: qty 1

## 2018-09-07 MED ORDER — VANCOMYCIN HCL 10 G IV SOLR
1500.0000 mg | Freq: Two times a day (BID) | INTRAVENOUS | Status: DC
  Administered 2018-09-07 – 2018-09-09 (×4): 1500 mg via INTRAVENOUS
  Filled 2018-09-07 (×5): qty 1500

## 2018-09-07 NOTE — Progress Notes (Signed)
Alex NeerKenneth G Neuhaus Jr. [id number]  Code Status: FULL  Admission Data: 09/07/2018 7:14 PM  Attending Provider: Mikey BussingHoffman  WUJ[id number]  Consults/ Treatment Team: Treatment Team:  Teryl LucyLandau, Joshua, MD  Alex NeerKenneth G Doxtater Jr. is a 37 y.o. male patient admitted from ED awake, alert - oriented X 4 - no acute distress noted. VSS - Blood pressure (!) 143/94, pulse 80, temperature 97.6 F (36.4 C), temperature source Oral, resp. rate 18, height 6\' 1"  (1.854 m), weight 81.6 kg, SpO2 100 %. no c/o shortness of breath, no c/o chest pain. Orientation to room, and floor completed with information packet given to patient/family. Admission INP armband ID verified with patient/family, and in place.  Call light within reach, patient able to voice, and demonstrate understanding. Skin clean and intact. No evidence of skin break down noted on exam.  ?  Will cont to eval and treat per MD orders.  Jon GillsElisa R [name], RN  09/07/2018 7:14 PM

## 2018-09-07 NOTE — ED Triage Notes (Signed)
Pt hit his right index finger moving furniture a few days ago. Pt noticed increased swelling and pain until tonight feels like "it is going to bust" and that "something is coming out of it" Finger is warm and dry, some bloody drainage around the nail bed, inability to flex/extend d/t pain.

## 2018-09-07 NOTE — ED Notes (Signed)
Pt c/o itching after morphine; benadryl given

## 2018-09-07 NOTE — Consult Note (Signed)
ORTHOPAEDIC CONSULTATION  REQUESTING PHYSICIAN: Gust Rung, DO  Chief Complaint: Right fingertip infection  HPI: Alex Wilkins. is a 37 y.o. male who complains of progressively increasing pain around the right fingertip for the last number of days.  He has had multiple pustules forming on his arm, forearm, and ultimately popped and the scabs.  He reports 10/10 pain with burning in the fingertips going up and down the arm.  He denies any recent trauma to the finger.  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  Past Medical History:  Diagnosis Date  . Atrial fibrillation with rapid ventricular response (HCC)   . Cellulitis and abscess of right leg 05/15/2017  . Pain following surgery or procedure   . Status post laparoscopic appendectomy 08/05/2016   Past Surgical History:  Procedure Laterality Date  . APPENDECTOMY    . INCISION AND DRAINAGE ABSCESS Right 05/15/2017   Procedure: INCISION AND DRAINAGE ABSCESS-RIGHT THIGH;  Surgeon: Leafy Ro, MD;  Location: [facility] ORS;  Service: General;  Laterality: Right;  . LAPAROSCOPIC APPENDECTOMY N/A 07/19/2016   Procedure: APPENDECTOMY LAPAROSCOPIC;  Surgeon: [name], MD;  Location: [facility] ORS;  Service: General;  Laterality: N/A;   Social History   Socioeconomic History  . Marital status: Married    Spouse name: Not on file  . Number of children: Not on file  . Years of education: Not on file  . Highest education level: Not on file  Occupational History  . Not on file  Social Needs  . Financial resource strain: Not on file  . Food insecurity:    Worry: Not on file    Inability: Not on file  . Transportation needs:    Medical: Not on file    Non-medical: Not on file  Tobacco Use  . Smoking status: Current Some Day Smoker    Types: Cigarettes  . Smokeless tobacco: Never Used  Substance and Sexual Activity  . Alcohol use: No  . Drug use: No  . Sexual  activity: Not on file  Lifestyle  . Physical activity:    Days per week: Not on file    Minutes per session: Not on file  . Stress: Not on file  Relationships  . Social connections:    Talks on phone: Not on file    Gets together: Not on file    Attends religious service: Not on file    Active member of club or organization: Not on file    Attends meetings of clubs or organizations: Not on file    Relationship status: Not on file  Other Topics Concern  . Not on file  Social History Narrative  . Not on file   No family history on file. Allergies  Allergen Reactions  . Penicillins Hives    Has patient had a PCN reaction causing immediate rash, facial/tongue/throat swelling, SOB or lightheadedness with hypotension:no Has patient had a PCN reaction causing severe rash involving mucus membranes or skin necrosis: No Has patient had a PCN reaction that required hospitalization No Has patient had a PCN reaction occurring within the last 10 years: No If all of the above answers are "NO", then may proceed with Cephalosporin use.   Marland Kitchen Hydrocodone Hives and Rash     Positive ROS: All other systems have been reviewed and were otherwise negative with the exception of those mentioned in the HPI and as above.  Physical Exam:  General: Alert, no acute distress Cardiovascular: No pedal edema Respiratory: No cyanosis, no use of accessory musculature GI: No organomegaly, abdomen is soft and non-tender Skin: Multiple areas of excoriation up and down the arm, none of them appear infected with the exception of the finger.     Neurologic: Sensation intact distally Psychiatric: Patient is competent for consent with normal mood and affect Lymphatic: No axillary or cervical lymphadenopathy  MUSCULOSKELETAL: His palm is soft, I do not feel like there is tracking erythema or fluid in the flexor tendon sheath clinically, he can extend his finger although he does this  reluctantly.  Assessment: Active Problems:   Skin abscess  Complex felon infection, right index finger  Plan: He has already had an incision and drainage in the emergency room, and it appears to be adequate clinically based on what I am seeing.  There is a wick in place, we will plan for IV antibiotics, warm soaks, likely removal of the wick tomorrow, I do not see anything that requires surgical intervention right now, but we will need to monitor for the possibility of flexor tendon synovitis.  I do not see evidence for that right now.  We have not yet however turned the corner as far as the infection in the fingertip goes.  We will plan to continue following, will allow him to eat today, n.p.o. after midnight tonight, just in case things take a turn for the worse.  IV vancomycin.     Alex PostJoshua P Bronnie Vasseur, MD Cell [phone]   09/07/2018 2:01 PM

## 2018-09-07 NOTE — ED Provider Notes (Addendum)
[facility] EMERGENCY DEPARTMENT Provider Note   CSN: [id number] Arrival date & time: 09/07/18  0330     History   Chief Complaint Chief Complaint  Patient presents with  . Finger Injury    HPI Alex Lona. is a 37 y.o. male with history of atrial fibrillation, MRSA who presents with a few day history of right index finger pain and swelling.  He feels like it is going to bust open.  He has had associated sores on his bilateral wrists which began prior to the finger swelling.  He reports he has been helping a family member move furniture, but denies any trauma that he remembers.  He denies any fevers.  He has had some pain radiating up to his neck from hand.  He denies any chest pain, shortness of breath, abdominal pain, nausea, vomiting.  He has been taking ibuprofen at home without relief. He denies drug or alcohol use. Tetanus up-to-date.  HPI  Past Medical History:  Diagnosis Date  . Atrial fibrillation with rapid ventricular response (HCC)   . Cellulitis and abscess of right leg 05/15/2017  . Pain following surgery or procedure   . Status post laparoscopic appendectomy 08/05/2016    Patient Active Problem List   Diagnosis Date Noted  . MRSA (methicillin resistant staph aureus) culture positive 06/05/2017  . Cellulitis and abscess of right leg 05/15/2017  . Status post laparoscopic appendectomy 08/05/2016  . Pain following surgery or procedure   . Atrial fibrillation with rapid ventricular response [facility])     Past Surgical History:  Procedure Laterality Date  . APPENDECTOMY    . INCISION AND DRAINAGE ABSCESS Right 05/15/2017   Procedure: INCISION AND DRAINAGE ABSCESS-RIGHT THIGH;  Surgeon: Leafy Ro, MD;  Location: [facility] ORS;  Service: General;  Laterality: Right;  . LAPAROSCOPIC APPENDECTOMY N/A 07/19/2016   Procedure: APPENDECTOMY LAPAROSCOPIC;  Surgeon: [name], MD;  Location: [facility] ORS;  Service: General;  Laterality: N/A;         Home Medications    Prior to Admission medications   Medication Sig Start Date End Date Taking? Authorizing Provider  bacitracin ointment Apply 1 application topically 2 (two) times daily. Patient not taking: Reported on 09/07/2018 06/23/18   Maczis, Elmer Sow, PA-C  ibuprofen (ADVIL,MOTRIN) 600 MG tablet Take 1 tablet (600 mg total) by mouth every 8 (eight) hours as needed. Patient not taking: Reported on 09/07/2018 01/10/18   Merrily Brittle, MD  oxyCODONE-acetaminophen (PERCOCET/ROXICET) 5-325 MG tablet Take 1 tablet by mouth every 4 (four) hours as needed for severe pain. Patient not taking: Reported on 09/07/2018 01/10/18   Merrily Brittle, MD  sulfamethoxazole-trimethoprim (BACTRIM DS,SEPTRA DS) 800-160 MG tablet Take 1 tablet by mouth 2 (two) times daily. Patient not taking: Reported on 09/07/2018 06/23/18   Maczis, Elmer Sow, PA-C    Family History No family history on file.  Social History Social History   Tobacco Use  . Smoking status: Current Some Day Smoker    Types: Cigarettes  . Smokeless tobacco: Never Used  Substance Use Topics  . Alcohol use: No  . Drug use: No     Allergies   Penicillins and Hydrocodone   Review of Systems Review of Systems  Constitutional: Negative for fever.  Musculoskeletal: Positive for arthralgias and joint swelling.     Physical Exam Updated Vital Signs BP (!) 144/89   Pulse 92   Temp 97.9 F (36.6 C) (Oral)   Resp 18   Ht 6\' 1"  (  1.854 m)   Wt 81.6 kg   SpO2 97%   BMI 23.75 kg/m   Physical Exam Vitals signs and nursing note reviewed.  Constitutional:      General: He is not in acute distress.    Appearance: He is well-developed. He is not diaphoretic.  HENT:     Head: Normocephalic and atraumatic.     Mouth/Throat:     Pharynx: No oropharyngeal exudate.  Eyes:     General: No scleral icterus.       Right eye: No discharge.        Left eye: No discharge.     Conjunctiva/sclera: Conjunctivae normal.      Pupils: Pupils are equal, round, and reactive to light.  Neck:     Musculoskeletal: Normal range of motion and neck supple.     Thyroid: No thyromegaly.  Cardiovascular:     Rate and Rhythm: Normal rate and regular rhythm.     Heart sounds: Normal heart sounds. No murmur. No friction rub. No gallop.   Pulmonary:     Effort: Pulmonary effort is normal. No respiratory distress.     Breath sounds: Normal breath sounds. No stridor. No wheezing or rales.  Abdominal:     General: Bowel sounds are normal. There is no distension.     Palpations: Abdomen is soft.     Tenderness: There is no abdominal tenderness. There is no guarding or rebound.  Musculoskeletal:     Comments: Significant edema and tightness under significant pressure to the right distal index finger with bloody drainage appearing to come from the nailbed; patient will not bend the finger at all and there is significant pain on palpation or any movement at all; tenderness tracks back to mid forearm; sensation intact, radial pulses intact  Lymphadenopathy:     Cervical: Cervical adenopathy (tender lymph node to the right neck, mobile, ~1cm) present.  Skin:    General: Skin is warm and dry.     Coloration: Skin is not pale.     Findings: No rash.     Comments: Patient has scabbed wounds to bilateral forearms and posterior neck  Neurological:     Mental Status: He is alert.     Coordination: Coordination normal.      ED Treatments / Results  Labs (all labs ordered are listed, but only abnormal results are displayed) Labs Reviewed  BASIC METABOLIC PANEL - Abnormal; Notable for the following components:      Result Value   Glucose, Bld 104 (*)    All other components within normal limits  AEROBIC CULTURE (SUPERFICIAL SPECIMEN)  CBC WITH DIFFERENTIAL/PLATELET    EKG None  Radiology Dg Hand Complete Right  Result Date: 09/07/2018 CLINICAL DATA:  Right index finger pain. Tenderness to back hand. Struck index finger  moving furniture a few days ago. Increased swelling and pain tonight. EXAM: RIGHT HAND - COMPLETE 3+ VIEW COMPARISON:  Middle finger radiograph 12/19/2005 FINDINGS: There is no evidence of fracture or dislocation. Truncated third distal phalanx appears postsurgical. No bony destructive change or periosteal reaction. Soft tissue edema of the index finger. No soft tissue air. No radiopaque foreign body. IMPRESSION: Soft tissue edema of the index finger. No radiopaque foreign body or acute osseous abnormality. Electronically Signed   By: Narda Rutherford M.D.   On: 09/07/2018 04:36    Procedures .Marland KitchenIncision and Drainage Date/Time: 09/07/2018 6:57 AM Performed by: Emi Holes, PA-C Authorized by: Emi Holes, PA-C   Consent:  Consent obtained:  Verbal   Consent given by:  Patient   Risks discussed:  Bleeding, incomplete drainage, infection and pain   Alternatives discussed:  No treatment Location:    Type:  Abscess   Size:  1 cm   Location:  Upper extremity   Upper extremity location:  Finger   Finger location:  R index finger Pre-procedure details:    Skin preparation:  Betadine Anesthesia (see MAR for exact dosages):    Anesthesia method:  Nerve block   Block location:  R index   Block needle gauge:  25 G   Block anesthetic:  Lidocaine 1% w/o epi   Block technique:  Transthecal digital block   Block injection procedure:  Anatomic landmarks identified, introduced needle, incremental injection, anatomic landmarks palpated and negative aspiration for blood   Block outcome:  Anesthesia achieved Procedure type:    Complexity:  Complex Procedure details:    Needle aspiration: no     Incision types:  Single straight   Incision depth:  Dermal   Scalpel blade:  11   Wound management:  Probed and deloculated and irrigated with saline   Drainage:  Purulent   Drainage amount:  Moderate   Packing materials:  1/4 in gauze Post-procedure details:    Patient tolerance of procedure:   Tolerated well, no immediate complications Comments:     Felon drained with 1.5 centimeter central volar incision with blunt dissection of the septa.  There was mild to moderate purulent and bloody drainage.  Wound culture collected.  Packing placed loosely to keep wound open.  Pressure dressing applied.  Celedonio Miyamoto Block Date/Time: 09/10/2018 11:01 PM Performed by: Emi Holes, PA-C Authorized by: Emi Holes, PA-C   Consent:    Consent obtained:  Verbal   Consent given by:  Patient   Risks discussed:  Infection, nerve damage, pain and allergic reaction   Alternatives discussed:  No treatment Indications:    Indications:  Procedural anesthesia and pain relief Location:    Body area:  Upper extremity   Upper extremity nerve blocked: digital.   Laterality:  Right Pre-procedure details:    Skin preparation:  2% chlorhexidine   Preparation: Patient was prepped and draped in usual sterile fashion   Skin anesthesia (see MAR for exact dosages):    Skin anesthesia method:  None Procedure details (see MAR for exact dosages):    Block needle gauge:  25 G   Anesthetic injected:  Lidocaine 1% w/o epi   Steroid injected:  None   Additive injected:  None   Injection procedure:  Anatomic landmarks identified, incremental injection, negative aspiration for blood, anatomic landmarks palpated and introduced needle   Paresthesia:  None Post-procedure details:    Dressing:  None   Outcome:  Anesthesia achieved   Patient tolerance of procedure:  Tolerated well, no immediate complications   (including critical care time)  Medications Ordered in ED Medications  vancomycin (VANCOCIN) 1,500 mg in sodium chloride 0.9 % 500 mL IVPB (has no administration in time range)  vancomycin (VANCOCIN) 1,500 mg in sodium chloride 0.9 % 500 mL IVPB (has no administration in time range)  oxyCODONE-acetaminophen (PERCOCET/ROXICET) 5-325 MG per tablet 1 tablet (1 tablet Oral Given 09/07/18 0419)  lidocaine  (PF) (XYLOCAINE) 1 % injection 30 mL (30 mLs Infiltration Given by Other 09/07/18 0511)  morphine 4 MG/ML injection 4 mg (4 mg Intravenous Given 09/07/18 0646)  ondansetron (ZOFRAN) injection 4 mg (4 mg Intravenous Given 09/07/18 0646)  diphenhydrAMINE (BENADRYL)  injection 25 mg (25 mg Intravenous Given 09/07/18 0656)     Initial Impression / Assessment and Plan / ED Course  I have reviewed the triage vital signs and the nursing notes.  Pertinent labs & imaging results that were available during my care of the patient were reviewed by me and considered in my medical decision making (see chart for details).     Patient with felon of R index finger with associated cellulitis and possible tenosynovitis.  X-ray of the hand shows soft tissue edema of the index finger, but no radiopaque foreign body or acute osseous abnormality.  Felon incised and drained in the ED as above.  Will admit to the hospital for IV antibiotics.  Vancomycin initiated.  Wound culture taken felon prior to antibiotic initiation during I&D, however patient has history of MRSA.  He has MRSA appearing wounds on his wrists and neck.  Hand surgery to be consulted as needed.  I spoke with internal medicine teaching service to admit the patient.  Appreciate their assistance with the patient.  Patient understands and agrees with plan.  Patient vitals stable.  Patient also evaluated by my attending, Dr. Rhunette CroftNanavati, who guided the patient's management and agrees with plan.  Final Clinical Impressions(s) / ED Diagnoses   Final diagnoses:  Felon of finger of right hand  Cellulitis of right upper extremity    ED Discharge Orders    None       Emi HolesLaw, Gyanna Jarema M, PA-C 09/07/18 [id number]    Emi HolesLaw, Savior Himebaugh M, PA-C 09/07/18 1402    Emi HolesLaw, Mable Dara M, PA-C 09/10/18 2302    Derwood KaplanNanavati, Ankit, MD 09/12/18 [phone]

## 2018-09-07 NOTE — Progress Notes (Signed)
Pharmacy Antibiotic Note  Alex NeerKenneth G Michetti Wilkins. is a 37 y.o. male admitted on 09/07/2018 with cellulitis.  Pharmacy has been consulted for vancomycin dosing.  Plan: Vancomycin 1500 mg IV q12 hours F/u renal function, cultures and clinical course  Height: 6\' 1"  (185.4 cm) Weight: 180 lb (81.6 kg) IBW/kg (Calculated) : 79.9  Temp (24hrs), Avg:97.9 F (36.6 C), Min:97.9 F (36.6 C), Max:97.9 F (36.6 C)  Recent Labs  Lab 09/07/18 0511  WBC 10.1  CREATININE 0.94    Estimated Creatinine Clearance: 122.8 mL/min (by C-G formula based on SCr of 0.94 mg/dL).    Allergies  Allergen Reactions  . Penicillins Hives    Has patient had a PCN reaction causing immediate rash, facial/tongue/throat swelling, SOB or lightheadedness with hypotension:no Has patient had a PCN reaction causing severe rash involving mucus membranes or skin necrosis: No Has patient had a PCN reaction that required hospitalization No Has patient had a PCN reaction occurring within the last 10 years: No If all of the above answers are "NO", then may proceed with Cephalosporin use.   Marland Kitchen. Hydrocodone Hives and Rash    Thank you for allowing pharmacy to be a part of this patient's care.  Talbert CageSeay, [name] 09/07/2018 6:29 AM
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
## 2018-09-07 NOTE — ED Notes (Signed)
Pt given graham crackers and coke  

## 2018-09-08 ENCOUNTER — Other Ambulatory Visit: Payer: Self-pay

## 2018-09-08 DIAGNOSIS — S60940A Unspecified superficial injury of right index finger, initial encounter: Secondary | ICD-10-CM

## 2018-09-08 DIAGNOSIS — Z22322 Carrier or suspected carrier of Methicillin resistant Staphylococcus aureus: Secondary | ICD-10-CM

## 2018-09-08 DIAGNOSIS — L03011 Cellulitis of right finger: Secondary | ICD-10-CM

## 2018-09-08 LAB — CBC
HCT: 45.4 % (ref 39.0–52.0)
Hemoglobin: 14.1 g/dL (ref 13.0–17.0)
MCH: 29.1 pg (ref 26.0–34.0)
MCHC: 31.1 g/dL (ref 30.0–36.0)
MCV: 93.8 fL (ref 80.0–100.0)
Platelets: 326 10*3/uL (ref 150–400)
RBC: 4.84 MIL/uL (ref 4.22–5.81)
RDW: 11.9 % (ref 11.5–15.5)
WBC: 7.4 10*3/uL (ref 4.0–10.5)
nRBC: 0 % (ref 0.0–0.2)

## 2018-09-08 LAB — COMPREHENSIVE METABOLIC PANEL
ALK PHOS: 110 U/L (ref 38–126)
ALT: 17 U/L (ref 0–44)
AST: 15 U/L (ref 15–41)
Albumin: 2.9 g/dL — ABNORMAL LOW (ref 3.5–5.0)
Anion gap: 9 (ref 5–15)
BUN: 9 mg/dL (ref 6–20)
CALCIUM: 8.8 mg/dL — AB (ref 8.9–10.3)
CO2: 27 mmol/L (ref 22–32)
CREATININE: 0.87 mg/dL (ref 0.61–1.24)
Chloride: 102 mmol/L (ref 98–111)
GFR calc Af Amer: >60 mL/min (ref >60)
GFR calc non Af Amer: >60 mL/min (ref >60)
Glucose, Bld: 106 mg/dL — ABNORMAL HIGH (ref 70–99)
Potassium: 4 mmol/L (ref 3.5–5.1)
Sodium: 138 mmol/L (ref 135–145)
Total Bilirubin: 0.7 mg/dL (ref 0.3–1.2)
Total Protein: 6.2 g/dL — ABNORMAL LOW (ref 6.5–8.1)

## 2018-09-08 LAB — C-REACTIVE PROTEIN: CRP: 2 mg/dL — ABNORMAL HIGH (ref <1.0)

## 2018-09-08 LAB — SEDIMENTATION RATE: Sed Rate: 15 mm/hr (ref 0–16)

## 2018-09-08 LAB — HIV ANTIBODY (ROUTINE TESTING W REFLEX): HIV Screen 4th Generation wRfx: NONREACTIVE

## 2018-09-08 MED ORDER — NICOTINE 14 MG/24HR TD PT24
14.0000 mg | MEDICATED_PATCH | Freq: Every day | TRANSDERMAL | Status: DC
  Administered 2018-09-08 – 2018-09-09 (×2): 14 mg via TRANSDERMAL
  Filled 2018-09-08 (×2): qty 1

## 2018-09-08 NOTE — Progress Notes (Signed)
   Subjective:  Alex NeerKenneth G Dorey Jr. is a 37 y.o. with PMH of recurrent cellulitis admit for right index finger cellulitis on hospital day 1  Mr.Alex Wilkins was examined and evaluated at bedside this AM. He was observed resting comfortably in bed eating crackers and peanut butter. He states Dr.Landau had evaluated him this morning and saw that his finger is improving but he would like to observe 1 more day to decide if he need to be taken to the OR. He states the pain is still severe but much improved compared to yesterday. Denies any F/ chills / N/V/D/C.   Objective:  Vital signs in last 24 hours: Vitals:   09/07/18 1357 09/07/18 2328 09/08/18 0457 09/08/18 1357  BP: (!) 143/94 120/80 125/78 (!) 132/95  Pulse: 80 82 81 (!) 116  Resp: 18 18 18 19   Temp: 97.6 F (36.4 C) 97.8 F (36.6 C) 97.7 F (36.5 C) 97.6 F (36.4 C)  TempSrc: Oral Oral Oral Oral  SpO2: 100% 100% 98% 100%  Weight:      Height:       Physical Exam  Constitutional: He is oriented to person, place, and time and well-developed, well-nourished, and in no distress. No distress.  HENT:  Head: Normocephalic and atraumatic.  Mouth/Throat: Oropharynx is clear and moist. No oropharyngeal exudate.  Eyes: Pupils are equal, round, and reactive to light. Conjunctivae and EOM are normal. No scleral icterus.  Neck: Normal range of motion. Neck supple. No JVD present.  Cardiovascular: Normal rate, regular rhythm, normal heart sounds and intact distal pulses.  Pulmonary/Chest: Effort normal and breath sounds normal. He has no rales.  Abdominal: Soft. Bowel sounds are normal. There is no abdominal tenderness.  Musculoskeletal: Normal range of motion.        General: Tenderness (Right upper extremity edema ) present. No edema.  Neurological: He is alert and oriented to person, place, and time. GCS score is 15.  Skin: Skin is warm and dry. He is not diaphoretic.    Assessment/Plan:  Active Problems:   Skin abscess  Mr.Alex Wilkins is a  37yo M w/ PMH of recurrent cellulitis admit for R finger cellulitis. He was found to be MRSA positive by PCR and his culture is growing staph aureus. He is currently on vancomycin which is appropriate abx therapy for his infection. Pending sensitivities, we expect to switch to oral antibiotics soon. Hand surgery requesting 1 more day of observation to determine if he needs to be taken to the OR.  Right index finger cellulitis 2/2 superficial injury No obvious sign of systemic illness. Wound culture show staph aureus. HIV negative. - Ortho consult appreciate recs: NPO at midnight for possible OR - F/u sensitivities -> switch to appropriate oral regimen - C/w vanc per pharmacy - Trend CBC  DVT prophx: Lovenox Diet: Regular for now, npo at midnight Bowel: Miralax Code: Full  Dispo: Anticipated discharge in approximately 2 day(s).   Alex Wilkins, Alex Kimrey K, MD 09/08/2018, 2:19 PM Pager: [phone]

## 2018-09-08 NOTE — Progress Notes (Addendum)
Patient ID: Alex NeerKenneth G Jerome Jr., male   DOB: Jan 28, 1981, 37 y.o.   MRN: [id number]     Subjective:  Patient reports pain as mild.  Patient is in bed and reports improvement and denies any fever or chills  Objective:   VITALS:   Vitals:   09/07/18 1055 09/07/18 1357 09/07/18 2328 09/08/18 0457  BP: (!) 152/95 (!) 143/94 120/80 125/78  Pulse: 85 80 82 81  Resp: 20 18 18 18   Temp:  97.6 F (36.4 C) 97.8 F (36.6 C) 97.7 F (36.5 C)  TempSrc:  Oral Oral Oral  SpO2: 99% 100% 100% 98%  Weight:      Height:        ABD soft Sensation intact distally Dorsiflexion/Plantar flexion intact Incision: dressing C/D/I and no drainage Packing removed and wound looks better Dry dressing applied  Lab Results  Component Value Date   WBC 7.4 09/08/2018   HGB 14.1 09/08/2018   HCT 45.4 09/08/2018   MCV 93.8 09/08/2018   PLT 326 09/08/2018   BMET    Component Value Date/Time   NA 138 09/08/2018 0430   NA 138 02/03/2013 2035   K 4.0 09/08/2018 0430   K 3.3 (L) 02/03/2013 2035   CL 102 09/08/2018 0430   CL 104 02/03/2013 2035   CO2 27 09/08/2018 0430   CO2 25 02/03/2013 2035   GLUCOSE 106 (H) 09/08/2018 0430   GLUCOSE 136 (H) 02/03/2013 2035   BUN 9 09/08/2018 0430   BUN 10 02/03/2013 2035   CREATININE 0.87 09/08/2018 0430   CREATININE 1.17 02/03/2013 2035   CALCIUM 8.8 (L) 09/08/2018 0430   CALCIUM 9.0 02/03/2013 2035   GFRNONAA >60 09/08/2018 0430   GFRNONAA >60 02/03/2013 2035   GFRAA >60 09/08/2018 0430   GFRAA >60 02/03/2013 2035     Assessment/Plan:     Active Problems:   Skin abscess   Advance diet Up with therapy Continue plan per medicine WBAT Dry dressing PRN   Alex SevereBrandon D Wilkins 09/08/2018, 12:21 PM  Discussed and agree with above.  Not looking like he will need more surgery, but will continue to monitor and keep npo after midnight tonight just in case.   Teryl LucyJoshua Karleen Seebeck, MD Cell [phone]

## 2018-09-09 DIAGNOSIS — B9562 Methicillin resistant Staphylococcus aureus infection as the cause of diseases classified elsewhere: Secondary | ICD-10-CM

## 2018-09-09 LAB — CBC
HCT: 43.4 % (ref 39.0–52.0)
Hemoglobin: 14.1 g/dL (ref 13.0–17.0)
MCH: 30.3 pg (ref 26.0–34.0)
MCHC: 32.5 g/dL (ref 30.0–36.0)
MCV: 93.1 fL (ref 80.0–100.0)
Platelets: 289 10*3/uL (ref 150–400)
RBC: 4.66 MIL/uL (ref 4.22–5.81)
RDW: 11.8 % (ref 11.5–15.5)
WBC: 8.2 10*3/uL (ref 4.0–10.5)
nRBC: 0 % (ref 0.0–0.2)

## 2018-09-09 LAB — AEROBIC CULTURE W GRAM STAIN (SUPERFICIAL SPECIMEN)

## 2018-09-09 LAB — AEROBIC CULTURE  (SUPERFICIAL SPECIMEN): Gram Stain: NONE SEEN

## 2018-09-09 MED ORDER — DOXYCYCLINE HYCLATE 100 MG PO TABS: 100.0000 mg | ORAL_TABLET | Freq: Two times a day (BID) | ORAL | 0 refills | Status: AC

## 2018-09-09 MED ORDER — OXYCODONE-ACETAMINOPHEN 5-325 MG PO TABS: 1.0000 | ORAL_TABLET | Freq: Four times a day (QID) | ORAL | 0 refills | Status: AC | PRN

## 2018-09-09 NOTE — Progress Notes (Addendum)
Patient ID: Alex NeerKenneth G Hagger Jr., male   DOB: 06/06/1981, 37 y.o.   MRN: [id number]   LOS: 2 days   Subjective: Finger pain character is different but severity about the same. He thinks as long as he can soak it and keep it dressed that it will continue to improve. He would like to go home.   Objective: Vital signs in last 24 hours: Temp:  [97.6 F (36.4 C)-98.6 F (37 C)] 98.3 F (36.8 C) (12/17 0622) Pulse Rate:  [93-116] 93 (12/17 0622) Resp:  [19] 19 (12/16 1357) BP: (131-140)/(90-95) 140/90 (12/17 0622) SpO2:  [98 %-100 %] 100 % (12/17 0622) Last BM Date: 09/09/18   Laboratory  CBC Recent Labs    09/08/18 0430 09/09/18 0318  WBC 7.4 8.2  HGB 14.1 14.1  HCT 45.4 43.4  PLT 326 289   BMET Recent Labs    09/07/18 0511 09/08/18 0430  NA 140 138  K 3.9 4.0  CL 102 102  CO2 26 27  GLUCOSE 104* 106*  BUN 9 9  CREATININE 0.94 0.87  CALCIUM 9.0 8.8*     Physical Exam General appearance: alert and no distress  Right hand: Still swollen, mildly erythematous. No purulent material noted, unable to express any fluid.   Assessment/Plan: Right hand felon -- Ok to discharge from ortho perspective. Continue soaks, please send home on appropriate oral abx. F/u with Dr. Dion SaucierLandau in 1 week.    Freeman CaldronMichael J. Jeffery, PA-C Orthopedic Surgery [phone] 09/09/2018   Reviewed, discussed and agree with above.  Eulas PostJoshua P Anja Neuzil, MD

## 2018-09-09 NOTE — Progress Notes (Addendum)
   Subjective:  Alex NeerKenneth G Mach Jr. is a 37 y.o. with PMH of recurrent cellulitis admit for right index finger cellulitis on hospital day 2  Alex Wilkins was examined and evaluated at bedside this AM. He was observed sleeping comfortably in bed. Upon awakening continuing to endorse pain, especially at the back of his head but otherwise feel okay. He states he has allergies to pencillin but no other meds. Discused possibility of discharge today. States he would like to go home. Denies any F/N/V/D/C.   Objective:  Vital signs in last 24 hours: Vitals:   09/08/18 0457 09/08/18 1357 09/08/18 2213 09/09/18 0622  BP: 125/78 (!) 132/95 (!) 131/93 140/90  Pulse: 81 (!) 116 (!) 105 93  Resp: 18 19    Temp: 97.7 F (36.5 C) 97.6 F (36.4 C) 98.6 F (37 C) 98.3 F (36.8 C)  TempSrc: Oral Oral Oral Oral  SpO2: 98% 100% 98% 100%  Weight:      Height:       Physical Exam  Constitutional: He is oriented to person, place, and time and well-developed, well-nourished, and in no distress. No distress.  HENT:  Head: Normocephalic and atraumatic.  Mouth/Throat: Oropharynx is clear and moist. No oropharyngeal exudate.  Eyes: Pupils are equal, round, and reactive to light. Conjunctivae and EOM are normal. No scleral icterus.  Neck: Normal range of motion. Neck supple. No JVD present.  Cardiovascular: Normal rate, regular rhythm, normal heart sounds and intact distal pulses.  Pulmonary/Chest: Effort normal and breath sounds normal. He has no rales.  Abdominal: Soft. Bowel sounds are normal. There is no abdominal tenderness.  Musculoskeletal:        General: Tenderness (Right finger wrapped in bandaging. Tenderness on flexion and palpation) present. No edema.     Comments: Able to flex index finger albeit limited due to pain  Neurological: He is alert and oriented to person, place, and time. GCS score is 15.  Skin: Skin is warm and dry. He is not diaphoretic.  Multiple superficial ulcers on various stages  of healing. Enlarged superficial nodule on back of his head.    Assessment/Plan:  Active Problems:   Skin abscess   Felon of finger of right hand  Alex Wilkins is a 37yo M w/ PMH of recurrent cellulitis admit for R finger cellulitis. Cleared by hand surgery. Stable for discharge today.  Right index finger cellulitis 2/2 superficial injury Wound culture show MRSA w/ sensitivities to bactrim, doxy - Ortho consult appreciate recs: F/u at outpatient clinic in 7 days - Discharge home w/ 5 additional days of doxy  DVT prophx: Lovenox Diet: Regular for now, npo at midnight Bowel: Miralax Code: Full  Dispo: Anticipated discharge in approximately today(s).   Alex Wilkins, Alex Lender K, MD 09/09/2018, 11:25 AM Pager: [phone]

## 2018-09-09 NOTE — Discharge Instructions (Signed)
Alex Wilkins,  You came to [place] with complaints of finger pain. We found out that you have a MRSA infection in your fingertip. Here are our recommendations for you at discharge:  - START doxycycline 100mg  twice a day for 5 additional days - We will also send percocet 5-325mg  every 6 hours for pain - Please follow up with Dr.Landau in 5 more days  Thank you for choosing [facility].  Fingertip Infection There are two main types of fingertip infections:  Paronychia. This is an infection that happens around your nail. This type of infection can start suddenly in one nail or occur gradually over time and affect more than one nail. Long-term (chronic) paronychia can make your fingernails thick and deformed.  Felon. This is a bacterial infection in the padded tip of your finger. Felon infection can cause a painful collection of pus (abscess) to form inside your fingertip. If the infection is not treated, the infection can spread as deep as the bone.  What are the causes? Paronychia infection can be caused by bacteria, funguses, or a mix of both. Felon infection is usually caused by the bacteria that are normally found on your skin. An infection can develop if the bacteria spread through your skin to the pad of tissue inside your fingertip. What increases the risk? A fingertip infection is more likely to develop in people:  Who have diabetes.  Who have a weak body defense (immune) system.  Who work with their hands.  Whose hands are exposed to moisture, chemicals, or irritants for long periods of time.  Who have poor circulation.  Who bite, chew, or pick their fingernails.  What are the signs or symptoms? Symptoms of paronychia may affect one or more fingernails and include:  Pain, swelling, and redness around the nail.  Pus-filled pockets at the base or side of the fingernail (cuticle).  Thick fingernails that separate from the nail bed.  Pus that drains from the nail bed.  Symptoms  of felon usually affect just one fingertip pad and include:  Severe, throbbing pain.  Redness.  Swelling.  Warmth.  Tenderness when the affected fingertip is touched.  How is this diagnosed? A fingertip infection is diagnosed with a medical history and physical exam. If there is pus draining from the infection, it may be swabbed and sent to the lab for a culture. An X-ray may be done to see if the infection has spread to the bone. How is this treated? Treatment for a fingertip infection may include:  Warm water or salt-water soaks several times per day.  Antibiotic medicine. This may be an ointment or pills.  Steroid ointment.  Antifungal pills.  Drainage of pus pockets. This is done by making a surgical cut (incision) to open the fingertip to drain pus.  Wearing gloves to protect your nails.  Follow these instructions at home: Medicines  Take or apply over-the-counter and prescription medicines only as told by your health care provider.  If you were prescribed antibiotic medicine, take or apply it as told by your health care provider. Do not stop using the antibiotic even if your condition improves. Wound care  Clean the infected area each day with warm water or salt water, or as told by your health care provider. ? Gently wash the infected area with mild soap and water. ? Rinse the infected area with water to remove all soap. ? Pat the infected area dry with a clean towel. Do not rub it. ? To make a  salt-water mixture, completely dissolve -1 tsp of salt in 1 cup of warm water.  Follow instructions from your health care provider about: ? How to take care of the infection. ? When and how you should change your bandage (dressing). ? When you should remove your dressing.  Check the infected area every day for more signs of infection. Watch for: ? More redness, swelling, or pain. ? More fluid or blood. ? Warmth. ? A bad smell. General instructions  Keep the  dressing dry until your health care provider says it can be removed. Do not take baths, swim, use a hot tub, or do anything that would put your wound underwater until your health care provider approves.  Raise (elevate) the infected area above the level of your heart while you are sitting or lying down or as told by your health care provider.  Do not scratch or pick at the infection.  Wear gloves as told by your health care provider, if this applies.  Keep all follow-up visits as told by your health care provider. This is important. How is this prevented?  Wear gloves when you work with your hands.  Wash your hands often with antibacterial soap.  Avoid letting your hands stay wet or irritated for long periods of time.  Do not bite your fingernails. Do not pull on your cuticles. Do not suck on your fingers.  Use clean scissors or nail clippers to trim your nails. Do not cut your fingernails very short. Contact a health care provider if:  Your pain medicine is not helping.  You have more redness, swelling, or pain at your fingertip.  You continue to have fluid, blood, or pus coming from your fingertip.  Your infection area feels warm to the touch.  You continue to notice a bad smell coming from your fingertip or your dressing. Get help right away if:  The area of redness is spreading, or you notice a red streak going away from your fingertip.  You have a fever. This information is not intended to replace advice given to you by your health care provider. Make sure you discuss any questions you have with your health care provider. Document Released: 10/18/2004 Document Revised: 02/16/2016 Document Reviewed: 02/28/2015 Elsevier Interactive Patient Education  2018 [employer]

## 2018-09-09 NOTE — Discharge Summary (Signed)
Name: Alex NeerKenneth G Kaczorowski Jr. MRN: [id number] DOB: 20-Jun-1981 37 y.o. [facility]  Date of Admission: 09/07/2018  3:31 AM Date of Discharge: 09/09/2018 Attending Physician: Debe CoderMullen, Emily, MD  Discharge Diagnosis: 1. R Index Finger Felon Cellulitis  Discharge Medications: Allergies as of 09/09/2018      Reactions   Penicillins Hives   Has patient had a PCN reaction causing immediate rash, facial/tongue/throat swelling, SOB or lightheadedness with hypotension:no Has patient had a PCN reaction causing severe rash involving mucus membranes or skin necrosis: No Has patient had a PCN reaction that required hospitalization No Has patient had a PCN reaction occurring within the last 10 years: No If all of the above answers are "NO", then may proceed with Cephalosporin use.   Hydrocodone Hives, Rash      Medication List    STOP taking these medications   sulfamethoxazole-trimethoprim 800-160 MG tablet Commonly known as:  BACTRIM DS,SEPTRA DS     TAKE these medications   bacitracin ointment Apply 1 application topically 2 (two) times daily.   doxycycline 100 MG tablet Commonly known as:  VIBRA-TABS Take 1 tablet (100 mg total) by mouth 2 (two) times daily for 5 days.   ibuprofen 600 MG tablet Commonly known as:  ADVIL,MOTRIN Take 1 tablet (600 mg total) by mouth every 8 (eight) hours as needed.   oxyCODONE-acetaminophen 5-325 MG tablet Commonly known as:  PERCOCET/ROXICET Take 1 tablet by mouth every 6 (six) hours as needed for up to 5 days for moderate pain. What changed:    when to take this  reasons to take this       Disposition and follow-up:   Alex G De BurrsSmith Jr. was [facility] in Stable condition.  At the hospital follow up visit please address:  1.  R Index Finger Felon Cellulitis - Admit with R index finger cellulitis - I&D performed in ED w/ culture growing MRSA - Discharged w/ 5 day of  doxycycline - Please ensure he finished his abx course as prescribed - Please confirm he followed up with hand surgery for concerns of tenosynovitis - Consider starting decolonization regimen with chlorhexidine rinse and mouthwash and mupirocin ointment  2.  Labs / imaging needed at time of follow-up: None  3.  Pending labs/ test needing follow-up: None  Follow-up Appointments:  Follow-up Information    Teryl LucyLandau, Jeury Mcnab, MD. Schedule an appointment as soon as possible for a visit in 1 week(s).   Specialty:  Orthopedic Surgery Contact information: [address] [place] [address] [phone]        [facility]. Call.   Contact information: [address] [place] [address] [phone]           Hospital Course by problem list: 1. R Index Finger Felon Cellulitis: Admit with swollen R index finger with stable vitals. No fever, leukocytosis, . X-ray of finger showed increased soft tissue edema without evidence of bone infection. I&D performed in the ED and culture data collected. Hand surgery consulted for evaluation for tenosynovitis. Started on vancomycin. Culture data resulted MRSA with susceptibilities to tetracycline. Hand surgery cleared him for discharge with outpatient follow up. Discharged home with 5 additional days of doxycycline.  Discharge Vitals:   BP 140/90 (BP Location: Left Arm)   Pulse 93   Temp 98.3 F (36.8 C) (Oral)   Resp 19   Ht 6\' 1"  (1.854 m)   Wt 81.6 kg   SpO2  100%   BMI 23.75 kg/m   Pertinent Labs, Studies, and Procedures:  CBC Latest Ref Rng & Units 09/09/2018 09/08/2018 09/07/2018  WBC 4.0 - 10.5 K/uL 8.2 7.4 10.1  Hemoglobin 13.0 - 17.0 g/dL 40.9 81.1 91.4  Hematocrit 39.0 - 52.0 % 43.4 45.4 43.6  Platelets 150 - 400 K/uL 289 326 307   Results for orders placed or performed during the hospital encounter of 09/07/18  Wound or Superficial Culture     Status: None   Collection Time: 09/07/18   6:13 AM  Result Value Ref Range Status   Specimen Description WOUND  Final   Special Requests RIGHT INDEX FINGER  Final   Gram Stain   Final    NO WBC SEEN RARE GRAM POSITIVE COCCI Performed at [facility], [address] [place], [place] [address]    Culture   Final    ABUNDANT METHICILLIN RESISTANT STAPHYLOCOCCUS AUREUS   Report Status 09/09/2018 FINAL  Final   Organism ID, Bacteria METHICILLIN RESISTANT STAPHYLOCOCCUS AUREUS  Final      Susceptibility   Methicillin resistant staphylococcus aureus - MIC*    CIPROFLOXACIN >=8 RESISTANT Resistant     ERYTHROMYCIN >=8 RESISTANT Resistant     GENTAMICIN <=0.5 SENSITIVE Sensitive     OXACILLIN >=4 RESISTANT Resistant     TETRACYCLINE <=1 SENSITIVE Sensitive     VANCOMYCIN <=0.5 SENSITIVE Sensitive     TRIMETH/SULFA <=10 SENSITIVE Sensitive     CLINDAMYCIN <=0.25 SENSITIVE Sensitive     RIFAMPIN <=0.5 SENSITIVE Sensitive     Inducible Clindamycin NEGATIVE Sensitive     * ABUNDANT METHICILLIN RESISTANT STAPHYLOCOCCUS AUREUS  MRSA PCR Screening     Status: Abnormal   Collection Time: 09/07/18  9:35 PM  Result Value Ref Range Status   MRSA by PCR POSITIVE (A) NEGATIVE Final    Comment:        The GeneXpert MRSA Assay (FDA approved for NASAL specimens only), is one component of a comprehensive MRSA colonization surveillance program. It is not intended to diagnose MRSA infection nor to guide or monitor treatment for MRSA infections. RESULT CALLED TO, READ BACK BY AND VERIFIED WITH: NGENO,W RN 09/07/18 AT 2321 SKEEN,P Performed at [facility], [address], [place], [place] [address]    Discharge Instructions: Alex Wilkins,  You came to [place] with complaints of finger pain. We found out that you have a MRSA infection in your fingertip. Here are our recommendations for you at discharge:  - START doxycycline 100mg  twice a day for 5 additional days - We will also send percocet 5-325mg  every 6 hours for pain - Please  follow up with Dr.Landau in 5 more days  Thank you for choosing [facility].  Discharge Instructions    Call MD for:  difficulty breathing, headache or visual disturbances   Complete by:  As directed    Call MD for:  persistant nausea and vomiting   Complete by:  As directed    Call MD for:  redness, tenderness, or signs of infection (pain, swelling, redness, odor or green/yellow discharge around incision site)   Complete by:  As directed    Call MD for:  severe uncontrolled pain   Complete by:  As directed    Call MD for:  temperature >100.4   Complete by:  As directed    Diet - low sodium heart healthy   Complete by:  As directed    Increase activity slowly   Complete by:  As directed       Signed: Theotis Barrio, MD 09/09/2018, 9:03 PM   Pager: [phone]

## 2018-09-09 NOTE — Care Management Note (Signed)
Case Management Note  Patient Details  Name: Alex NeerKenneth G Carrigg Jr. MRN: [id number] Date of Birth: [birth year]  Subjective/Objective:        Admitted for right index finger cellulitis           Action/Plan: Transition to home. No needs identified per NCM.  Expected Discharge Date:  09/09/18               Expected Discharge Plan:  Home/Self Care  In-House Referral:  NA  Discharge planning Services  NA  Post Acute Care Choice:  NA Choice offered to:  NA  DME Arranged:  N/A DME Agency:  NA  HH Arranged:  NA HH Agency:  NA  Status of Service:  Completed, signed off  If discussed at Long Length of Stay Meetings, dates discussed:    Additional Comments:  Epifanio LeschesCole, Jotham Ahn Hudson, RN 09/09/2018, 12:07 PM

## 2019-04-22 IMAGING — CR DG CHEST 2V
2 series · 2 of 2 positions shown · non-contrast
Comparison: Chest radiograph October 21, 2016 and chest CT October 21, 2016

CLINICAL DATA: Shortness of breath and chest pain

EXAM:
CHEST  2 VIEW

[chest pa]
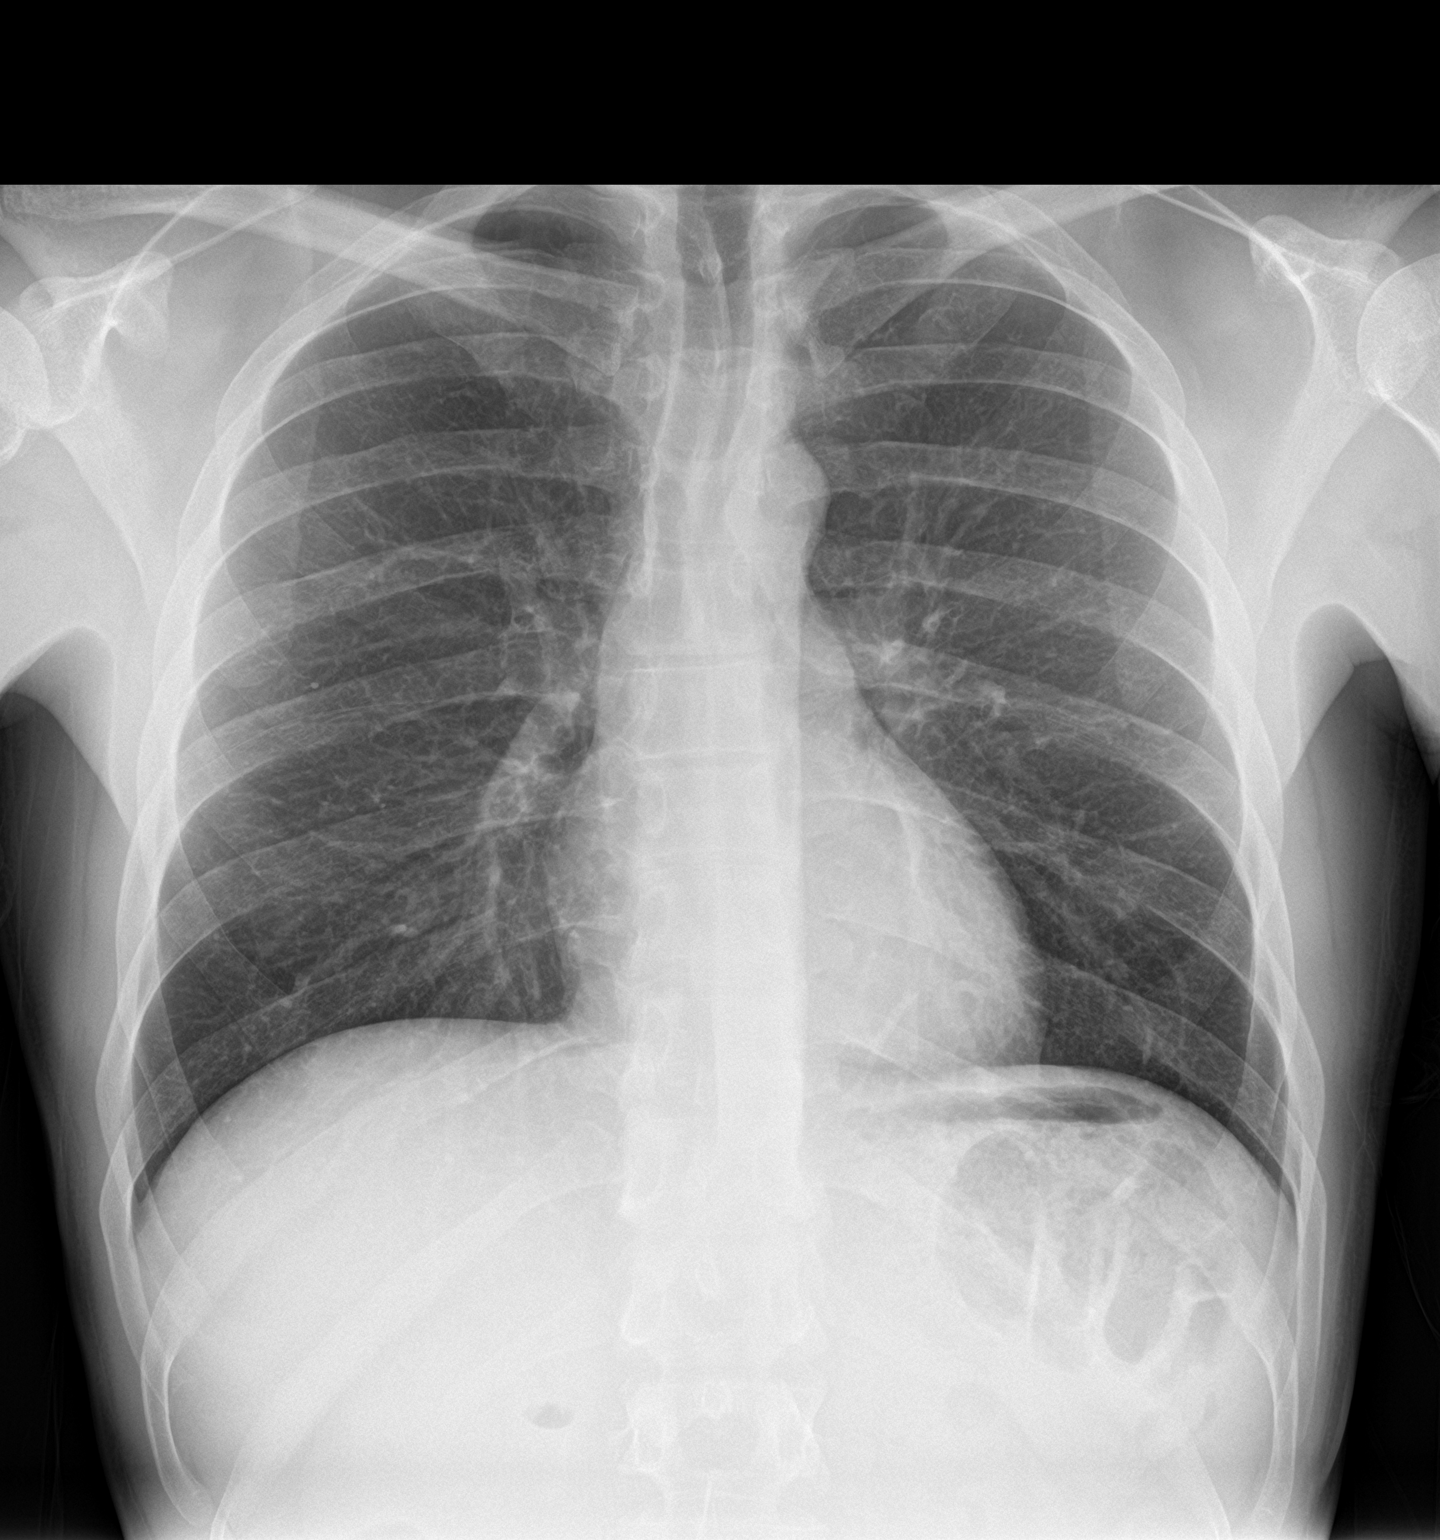

[chest lat]
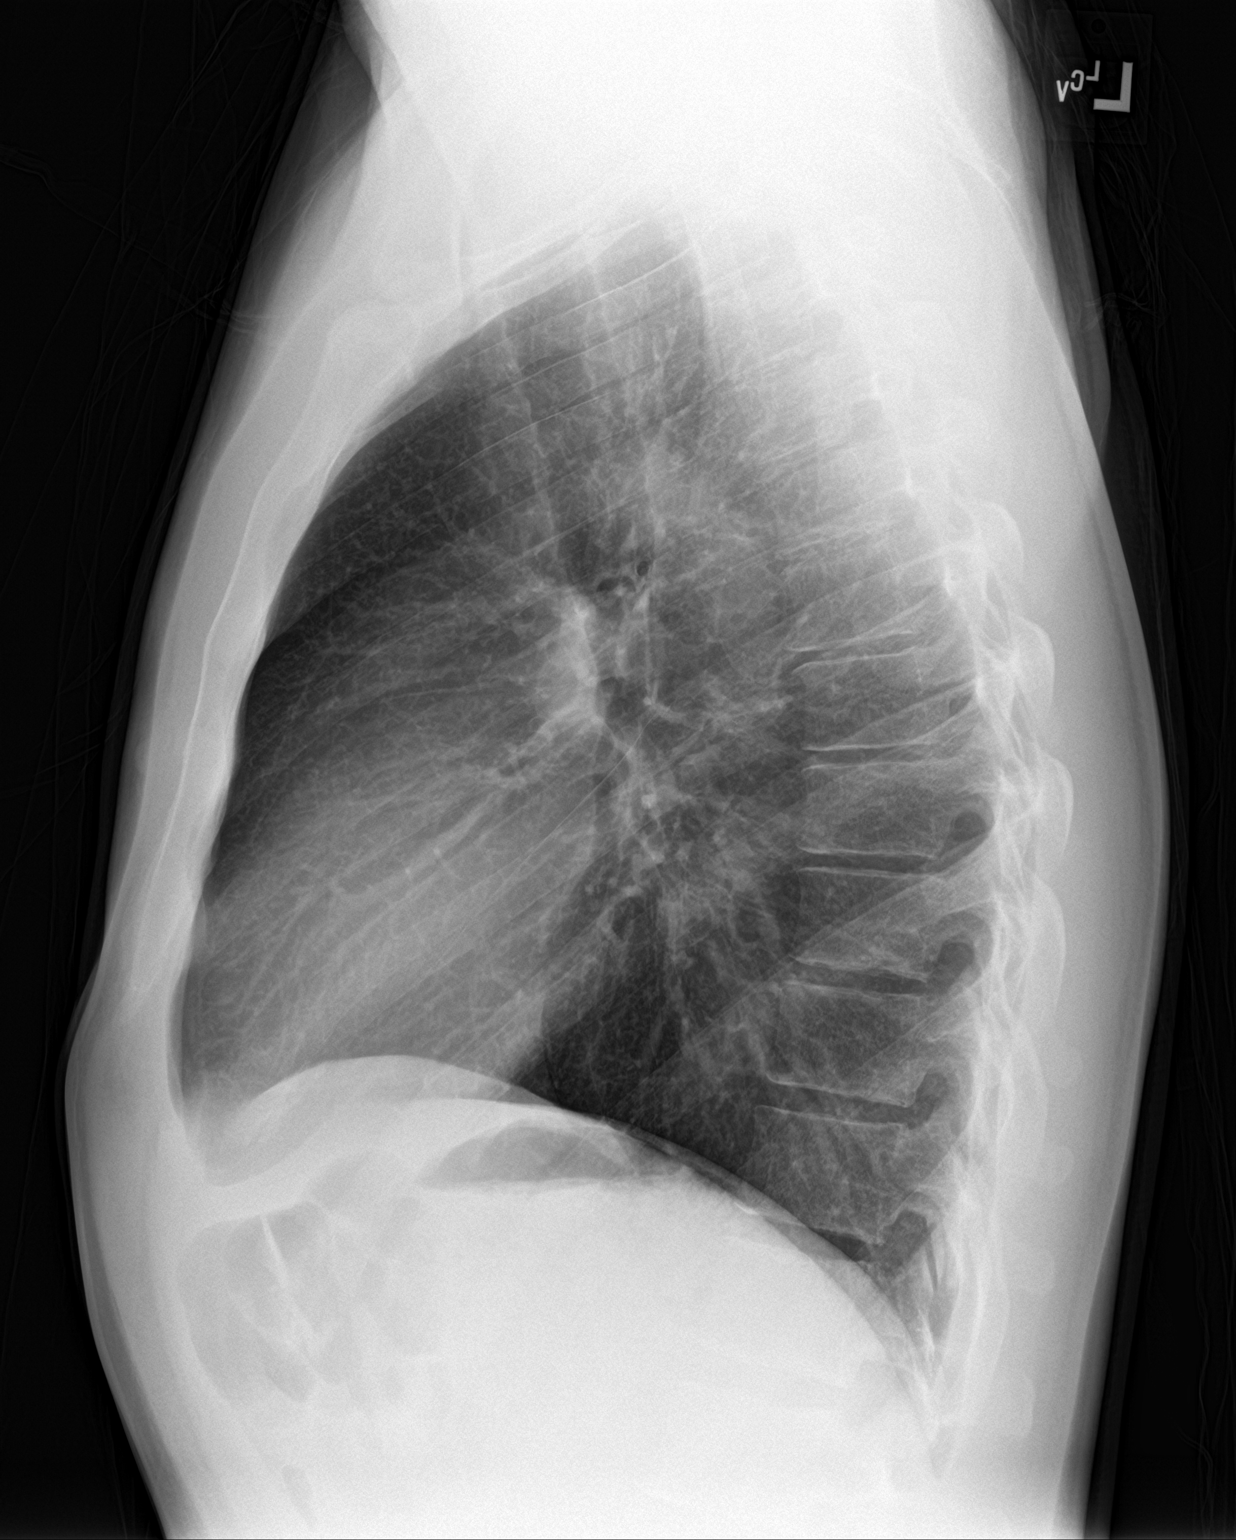

[2 of 2 positions shown; findings below may reference images not displayed]

FINDINGS: There is no edema or consolidation. The heart size and pulmonary
vascularity are normal. No adenopathy. No pneumothorax. No bone
lesions.
IMPRESSION: No edema or consolidation.

## 2019-11-09 ENCOUNTER — Ambulatory Visit: Payer: Medicaid Other | Admitting: Family Medicine

## 2020-04-01 ENCOUNTER — Other Ambulatory Visit: Payer: Self-pay

## 2020-04-01 ENCOUNTER — Emergency Department (HOSPITAL_COMMUNITY)
Admission: EM | Admit: 2020-04-01 | Discharge: 2020-04-01 | Discharge status: Against Medical Advice | Payer: Medicaid Other | Attending: Emergency Medicine | Admitting: Emergency Medicine

## 2020-04-01 DIAGNOSIS — F17210 Nicotine dependence, cigarettes, uncomplicated: Secondary | ICD-10-CM | POA: Insufficient documentation

## 2020-04-01 DIAGNOSIS — M791 Myalgia, unspecified site: Secondary | ICD-10-CM | POA: Insufficient documentation

## 2020-04-01 DIAGNOSIS — R52 Pain, unspecified: Secondary | ICD-10-CM

## 2020-04-01 DIAGNOSIS — L989 Disorder of the skin and subcutaneous tissue, unspecified: Secondary | ICD-10-CM

## 2020-04-01 DIAGNOSIS — Z79899 Other long term (current) drug therapy: Secondary | ICD-10-CM | POA: Insufficient documentation

## 2020-04-01 MED ORDER — DOXYCYCLINE HYCLATE 100 MG PO CAPS: 100.0000 mg | ORAL_CAPSULE | Freq: Two times a day (BID) | ORAL | 0 refills | Status: DC

## 2020-04-01 MED ORDER — ACETAMINOPHEN 500 MG PO TABS: 1000.0000 mg | ORAL_TABLET | Freq: Once | ORAL | Status: DC

## 2020-04-01 NOTE — ED Notes (Signed)
Attempt made to get COVID test on the pt and when this RN when to his room he was gone. Provider notified.

## 2020-04-01 NOTE — ED Triage Notes (Signed)
Pt here for eval of generalized body aches, cough, and headache x 3 days. Denies fevers or sick contacts.

## 2020-04-01 NOTE — ED Provider Notes (Signed)
[facility] EMERGENCY DEPARTMENT Provider Note   CSN: [id number] Arrival date & time: 04/01/20  1529     History Chief Complaint  Patient presents with  . Generalized Body Aches    Alex Mcelwain. is a 39 y.o. male presents to the ED for evaluation of generalized body pains.  States this has been going on for several months but it has worsened in the last few days.  Describes him as "growing pains" in his bones in his legs and arms and also in his back.  Has associated intermittent headaches and feeling like he has hot flashes.  He reported cough to triage nurse but he denies this to me.  Unknown if he has had a frank fever because he does not check his temperature at home.  Has not tried anything at home because he does not like to take any "pain medicines".  He denies nasal congestion, sore throat, cough.  No chest pain or shortness of breath.  No vomiting or diarrhea or abdominal pain.  No dysuria.  Patient noted to have several ulcerated lesions in his distal forearms bilaterally, dorsal hands.  These appear to be in different stages of healing.  I asked patient about these and he states he has had these for a long time.  States he had a doctor at [facility] that has done some testing and no one is able to tell him why he has these bumps.  States he is just, and they are sometimes painful and red and he picks at them and they usually scab over.  Adamantly denies IV drug use.  States several years ago he had an infection in either his left shoulder or left elbow but is not sure.  He was admitted to the hospital for it.  Not vaccinated for Covid.  States if he has not gotten this by now he is probably fine and does not need the vaccine.  HPI     Past Medical History:  Diagnosis Date  . Atrial fibrillation with rapid ventricular response (HCC)   . Cellulitis and abscess of right leg 05/15/2017  . Pain following surgery or procedure   . Status post laparoscopic appendectomy  08/05/2016    Patient Active Problem List   Diagnosis Date Noted  . Felon of finger of right hand   . Skin abscess 09/07/2018  . Cellulitis of right upper extremity   . MRSA (methicillin resistant staph aureus) culture positive 06/05/2017  . Cellulitis and abscess of right leg 05/15/2017  . Status post laparoscopic appendectomy 08/05/2016  . Pain following surgery or procedure   . Atrial fibrillation with rapid ventricular response [facility])     Past Surgical History:  Procedure Laterality Date  . APPENDECTOMY    . INCISION AND DRAINAGE ABSCESS Right 05/15/2017   Procedure: INCISION AND DRAINAGE ABSCESS-RIGHT THIGH;  Surgeon: Leafy Ro, MD;  Location: [facility] ORS;  Service: General;  Laterality: Right;  . LAPAROSCOPIC APPENDECTOMY N/A 07/19/2016   Procedure: APPENDECTOMY LAPAROSCOPIC;  Surgeon: [name], MD;  Location: [facility] ORS;  Service: General;  Laterality: N/A;       No family history on file.  Social History   Tobacco Use  . Smoking status: Current Some Day Smoker    Types: Cigarettes  . Smokeless tobacco: Never Used  Vaping Use  . Vaping Use: Never used  Substance Use Topics  . Alcohol use: No  . Drug use: No    Home Medications Prior to Admission medications  Medication Sig Start Date End Date Taking? Authorizing Provider  bacitracin ointment Apply 1 application topically 2 (two) times daily. Patient not taking: Reported on 09/07/2018 06/23/18   Maczis, Elmer Sow, PA-C  doxycycline (VIBRAMYCIN) 100 MG capsule Take 1 capsule (100 mg total) by mouth 2 (two) times daily. 04/01/20   Liberty Handy, PA-C  ibuprofen (ADVIL,MOTRIN) 600 MG tablet Take 1 tablet (600 mg total) by mouth every 8 (eight) hours as needed. Patient not taking: Reported on 09/07/2018 01/10/18   Merrily Brittle, MD    Allergies    Penicillins and Hydrocodone  Review of Systems   Review of Systems  Constitutional: Positive for chills.  Musculoskeletal: Positive for myalgias.  Skin:        Lesions skin   Neurological: Positive for headaches.  All other systems reviewed and are negative.   Physical Exam Updated Vital Signs BP 119/69 (BP Location: Left Arm)   Pulse (!) 111   Temp 98.9 F (37.2 C) (Oral)   Resp 17   Ht 6\' 1"  (1.854 m)   Wt 81.6 kg   SpO2 98%   BMI 23.73 kg/m   Physical Exam Vitals and nursing note reviewed.  Constitutional:      Appearance: He is well-developed.     Comments: NAD.  HENT:     Head: Normocephalic and atraumatic.     Right Ear: External ear normal.     Left Ear: External ear normal.     Nose: Nose normal.     Comments: Normal nasal mucosa, no rhinorrhea or obvious congestion    Mouth/Throat:     Comments: Oropharynx and tonsils normal, no erythema or exudates Eyes:     General: No scleral icterus.    Conjunctiva/sclera: Conjunctivae normal.  Neck:     Comments: No cervical lymphadenopathy Cardiovascular:     Rate and Rhythm: Normal rate and regular rhythm.     Heart sounds: Normal heart sounds.  Pulmonary:     Effort: Pulmonary effort is normal.     Breath sounds: No wheezing.  Musculoskeletal:        General: No deformity. Normal range of motion.     Cervical back: Normal range of motion and neck supple.     Comments: Scars on left anterior shoulder and left elbow, no acute infectious process on joints upper/lower extremities   Skin:    General: Skin is warm and dry.     Capillary Refill: Capillary refill takes less than 2 seconds.     Comments: Several ulcerated lesions in distal ventral forearms, dorsal hands bilaterally.  These are in different stages of healing.  Mild surrounding circumferential erythema with mild tenderness but no warmth, fluctuance.  No evidence of abscess.  Exam in entirety of patient's skin including in the trunk, back, lower extremities, it looks like he shaved his entire body including his scalp.  Neurological:     Mental Status: He is alert and oriented to person, place, and time.    Psychiatric:        Behavior: Behavior normal.        Thought Content: Thought content normal.        Judgment: Judgment normal.     ED Results / Procedures / Treatments   Labs (all labs ordered are listed, but only abnormal results are displayed) Labs Reviewed  SARS CORONAVIRUS 2 BY RT PCR (HOSPITAL ORDER, PERFORMED IN [facility])    EKG None  Radiology No results found. Scar noted in  the left anterior Procedures Procedures (including critical care time)  Medications Ordered in ED Medications  acetaminophen (TYLENOL) tablet 1,000 mg (has no administration in time range)    ED Course  I have reviewed the triage vital signs and the nursing notes.  Pertinent labs & imaging results that were available during my care of the patient were reviewed by me and considered in my medical decision making (see chart for details).    MDM Rules/Calculators/A&P                          39 year old male presents for body aches for months, acutely worsening in the last few days.  Subjective chills, headaches.  No other localizing symptoms of infection like URI, GI or GU symptoms.    He has several ulcerated like lesions in different stages of healing on distal forearms, hands. He states these are chronic, slightly tender.  No evidence of severe cellulitis or abscess today. Tachycardia initially but this improved without intervention.  I reviewed patient's available medical records.  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  He has been on topical medicines and doxycycline in the past with good response.  Adamantly denies IVDU and no documented history of IVDU. Wound cultures in the past positive for MRSA.   Given vague symptoms, headaches, myalgias reasonable to test for COVID.  Will give tylenol here.  Given history of chronic recurrent folliculitis, will  discharge with doxycycline, NSAIDs.  ?early cellulitis.  Recommended PCP follow up.  Return precautions discussed. Patient aware of symptoms that would warrant return to ED for re-evaluation.   1807: RN notified me after my evaluation patient eloped.  He did not receive his AVS. Doxycycline sent to his pharmacy electronically.  Final Clinical Impression(s) / ED Diagnoses Final diagnoses:  Body aches  Skin lesions    Rx / DC Orders ED Discharge Orders         Ordered    doxycycline (VIBRAMYCIN) 100 MG capsule  2 times daily     Discontinue  Reprint     04/01/20 1807           Liberty Handy, PA-C 04/01/20 1808    Alex Grizzle, MD 04/08/20 1440

## 2020-06-24 ENCOUNTER — Encounter: Payer: Self-pay | Admitting: Emergency Medicine

## 2020-06-24 ENCOUNTER — Emergency Department
Admission: EM | Admit: 2020-06-24 | Discharge: 2020-06-24 | Disposition: A | Discharge status: Home / Self Care | Payer: Medicaid Other | Attending: Emergency Medicine | Admitting: Emergency Medicine

## 2020-06-24 ENCOUNTER — Other Ambulatory Visit: Payer: Self-pay

## 2020-06-24 DIAGNOSIS — F17210 Nicotine dependence, cigarettes, uncomplicated: Secondary | ICD-10-CM | POA: Insufficient documentation

## 2020-06-24 DIAGNOSIS — K0889 Other specified disorders of teeth and supporting structures: Secondary | ICD-10-CM | POA: Diagnosis present

## 2020-06-24 DIAGNOSIS — K047 Periapical abscess without sinus: Secondary | ICD-10-CM | POA: Diagnosis not present

## 2020-06-24 MED ORDER — OXYCODONE-ACETAMINOPHEN 5-325 MG PO TABS: 1.0000 | ORAL_TABLET | Freq: Four times a day (QID) | ORAL | 0 refills | Status: AC | PRN

## 2020-06-24 MED ORDER — OXYCODONE-ACETAMINOPHEN 5-325 MG PO TABS
1.0000 | ORAL_TABLET | Freq: Once | ORAL | Status: AC
  Administered 2020-06-24: 1 via ORAL
  Filled 2020-06-24: qty 1

## 2020-06-24 MED ORDER — CLINDAMYCIN HCL 150 MG PO CAPS: ORAL_CAPSULE | ORAL | 0 refills | Status: DC

## 2020-06-24 NOTE — ED Provider Notes (Signed)
[facility] Emergency Department Provider Note   ____________________________________________   None    (approximate)  I have reviewed the triage vital signs and the nursing notes.   HISTORY  Chief Complaint Dental Pain and Facial Swelling    HPI Alex Wilkins. is a 39 y.o. male presents to the ED with complaint of left lower dental pain for several days.  Patient states that his tooth has been decaying for some time but recently began hurting.  He is used over-the-counter Orajel and ibuprofen without any relief.  Currently he rates pain as a 10/10.       Past Medical History:  Diagnosis Date  . Atrial fibrillation with rapid ventricular response (HCC)   . Cellulitis and abscess of right leg 05/15/2017  . Pain following surgery or procedure   . Status post laparoscopic appendectomy 08/05/2016    Patient Active Problem List   Diagnosis Date Noted  . Felon of finger of right hand   . Skin abscess 09/07/2018  . Cellulitis of right upper extremity   . MRSA (methicillin resistant staph aureus) culture positive 06/05/2017  . Cellulitis and abscess of right leg 05/15/2017  . Status post laparoscopic appendectomy 08/05/2016  . Pain following surgery or procedure   . Atrial fibrillation with rapid ventricular response [facility])     Past Surgical History:  Procedure Laterality Date  . APPENDECTOMY    . INCISION AND DRAINAGE ABSCESS Right 05/15/2017   Procedure: INCISION AND DRAINAGE ABSCESS-RIGHT THIGH;  Surgeon: Leafy Ro, MD;  Location: [facility] ORS;  Service: General;  Laterality: Right;  . LAPAROSCOPIC APPENDECTOMY N/A 07/19/2016   Procedure: APPENDECTOMY LAPAROSCOPIC;  Surgeon: [name], MD;  Location: [facility] ORS;  Service: General;  Laterality: N/A;    Prior to Admission medications   Medication Sig Start Date End Date Taking? Authorizing Provider  clindamycin (CLEOCIN) 150 MG capsule Take 2 caps tid until finished 06/24/20   Tommi Rumps, PA-C  oxyCODONE-acetaminophen (PERCOCET) 5-325 MG tablet Take 1 tablet by mouth every 6 (six) hours as needed for severe pain. 06/24/20 06/24/21  Tommi Rumps, PA-C    Allergies Penicillins and Hydrocodone  History reviewed. No pertinent family history.  Social History Social History   Tobacco Use  . Smoking status: Current Some Day Smoker    Types: Cigarettes  . Smokeless tobacco: Never Used  Vaping Use  . Vaping Use: Never used  Substance Use Topics  . Alcohol use: No  . Drug use: No    Review of Systems Constitutional: No fever/chills Eyes: No visual changes. ENT: Positive for dental pain. Cardiovascular: Denies chest pain. Respiratory: Denies shortness of breath. Gastrointestinal: No abdominal pain.  No nausea, no vomiting.  Musculoskeletal: Negative for back pain. Skin: Negative for rash. Neurological: Negative for headaches, focal weakness or numbness.  ____________________________________________   PHYSICAL EXAM:  VITAL SIGNS: ED Triage Vitals  Enc Vitals Group     BP 06/24/20 0714 (!) 144/101     Pulse Rate 06/24/20 0714 88     Resp 06/24/20 0714 18     Temp 06/24/20 0714 97.9 F (36.6 C)     Temp Source 06/24/20 0714 Oral     SpO2 06/24/20 0714 100 %     Weight 06/24/20 0714 175 lb (79.4 kg)     Height 06/24/20 0714 6\' 1"  (1.854 m)     Head Circumference --      Peak Flow --      Pain Score  06/24/20 0716 10     Pain Loc --      Pain Edu? --      Excl. in GC? --     Constitutional: Alert and oriented. Well appearing and in no acute distress. Eyes: Conjunctivae are normal.  Head: Atraumatic. Nose: No congestion/rhinnorhea. Mouth/Throat: Mucous membranes are moist.  Oropharynx non-erythematous.  Left lower molar in poor repair with large cary present.  Moderate tenderness on palpation in the area.  No active drainage is noted at this time. Neck: No stridor.   Cardiovascular: Normal rate, regular rhythm. Grossly normal heart sounds.   Good peripheral circulation. Respiratory: Normal respiratory effort.  No retractions. Lungs CTAB. Musculoskeletal: No lower extremity tenderness nor edema.  No joint effusions. Neurologic:  Normal speech and language. No gross focal neurologic deficits are appreciated. No gait instability. Skin:  Skin is warm, dry and intact. No rash noted. Psychiatric: Mood and affect are normal. Speech and behavior are normal.  ____________________________________________   LABS (all labs ordered are listed, but only abnormal results are displayed)  Labs Reviewed - No data to display  PROCEDURES  Procedure(s) performed (including Critical Care):  Procedures   ____________________________________________   INITIAL IMPRESSION / ASSESSMENT AND PLAN / ED COURSE  As part of my medical decision making, I reviewed the following data within the electronic MEDICAL RECORD NUMBER Notes from prior ED visits and [place] Controlled Substance Database  39 year old male presents to the ED with complaint of left lower dental pain that has been going on for several weeks.  Patient states it is gotten worse over the last 2 days.  Patient currently does not have a dentist.  He has been using over-the-counter medication including ibuprofen without any relief.  Patient was given Percocet 5 mg while in the ED.  A prescription for clindamycin and oxycodone was sent to his pharmacy.  He was given a list of dental clinics in the area along with the information about walk-in dental clinics.  He is encouraged strongly to follow-up with 1 of these clinics for his dental pain.   ____________________________________________   FINAL CLINICAL IMPRESSION(S) / ED DIAGNOSES  Final diagnoses:  Dental abscess     ED Discharge Orders         Ordered    clindamycin (CLEOCIN) 150 MG capsule        06/24/20 0828    oxyCODONE-acetaminophen (PERCOCET) 5-325 MG tablet  Every 6 hours PRN        06/24/20 0828          *Please note:   Alex Wilkins. was evaluated in Emergency Department on 06/24/2020 for the symptoms described in the history of present illness. He was evaluated in the context of the global COVID-19 pandemic, which necessitated consideration that the patient might be at risk for infection with the SARS-CoV-2 virus that causes COVID-19. Institutional protocols and algorithms that pertain to the evaluation of patients at risk for COVID-19 are in a state of rapid change based on information released by regulatory bodies including the CDC and federal and state organizations. These policies and algorithms were followed during the patient's care in the ED.  Some ED evaluations and interventions may be delayed as a result of limited staffing during and the pandemic.*   Note:  This document was prepared using Dragon voice recognition software and may include unintentional dictation errors.    Tommi Rumps, PA-C 06/24/20 1142    Willy Eddy, MD 06/24/20 1147

## 2020-06-24 NOTE — ED Triage Notes (Signed)
Pt reports started with a toothache on the left lower jaw and now has pain and some swelling to left side of face.

## 2020-06-24 NOTE — ED Notes (Signed)
See triage note  Presents with possible dental abscess   Developed pain to left gumline about 2 weeks ago States pain is now into entire left side of face,ear and lymph node  No fever

## 2020-06-24 NOTE — ED Triage Notes (Signed)
Pt states took tylenol and ibuprofen about 3 hours ago and used some orajel with little relief

## 2020-06-24 NOTE — Discharge Instructions (Addendum)
Follow-up with one of the dentist listed on your discharge papers.  A list of clinics is available with most of these at a reduced rate.  Also the clinics for [facility] and [place] have walk-in hours. Take all of antibiotics until completely finished.  Take pain medication only as directed.  Decrease or discontinue smoking as this will help with your dental pain.  OPTIONS FOR DENTAL FOLLOW UP CARE  [place] Department of Health and Human Services - Local Safety Net Dental Clinics [email or web address]   [facility] [phone])  [facility] [phone])  [place] [phone])  [facility] [phone])  [facility] [phone]) This clinic caters to the indigent population and is on a lottery system. Location: [employer] [facility], [employer], [address], [place] Clinic Hours: Wednesdays from 6pm - 9pm, patients seen by a lottery system. For dates, call or go to [email or web address] Services: Cleanings, fillings and simple extractions. Payment Options: DENTAL WORK IS FREE OF CHARGE. Bring proof of income or support. Best way to get seen: Arrive at 5:15 pm - this is a lottery, NOT first come/first serve, so arriving earlier will not increase your chances of being seen.     [facility] [phone] Select option 1 for emergencies   Location: [facility], [place], [address] [place] Clinic Hours: No walk-ins accepted - call the day before to schedule an appointment. Check in times are 9:30 am and 1:30 pm. Services: Simple extractions, temporary fillings, pulpectomy/pulp debridement, uncomplicated abscess drainage. Payment Options: PAYMENT IS DUE AT THE TIME OF SERVICE.  Fee is usually $100-200, additional surgical procedures (e.g. abscess drainage) may be  extra. Cash, checks, Visa/MasterCard accepted.  Can file Medicaid if patient is covered for dental - patient should call case worker to check. No discount for [facility] patients. Best way to get seen: MUST call the day before and get onto the schedule. Can usually be seen the next 1-2 days. No walk-ins accepted.     [facility] [phone]   Location: [facility], [address] [place] Clinic Hours: M, W, Th, F 8am or 1:30pm, Tues 9a or 1:30 - first come/first served. Services: Simple extractions, temporary fillings, uncomplicated abscess drainage.  You do not need to be an [facility] resident. Payment Options: PAYMENT IS DUE AT THE TIME OF SERVICE. Dental insurance, otherwise sliding scale - bring proof of income or support. Depending on income and treatment needed, cost is usually $50-200. Best way to get seen: Arrive early as it is first come/first served.     [facility] [phone]   Location: [address] Clinic Hours: Mon-Thu 8a-5p Services: Most basic dental services including extractions and fillings. Payment Options: PAYMENT IS DUE AT THE TIME OF SERVICE. Sliding scale, up to 50% off - bring proof if income or support. Medicaid with dental option accepted. Best way to get seen: Call to schedule an appointment, can usually be seen within 2 weeks OR they will try to see walk-ins - show up at 8a or 2p (you may have to wait).     [facility] [phone] [place] RESIDENTS ONLY   Location: [facility], [address], [place], [place] [address] Clinic Hours: By appointment only. Monday - Thursday 8am-5pm, Friday 8am-12pm Services: Cleanings, fillings, extractions. Payment Options: PAYMENT IS DUE AT THE TIME OF SERVICE. Cash,  Visa or MasterCard. Sliding scale - $30 minimum per service. Best way to get seen: Come in to office,  complete packet and make an appointment - need proof of income or support monies for each household member and proof of [facility] residence. Usually takes about a month to get in.     [facility] [phone]   Location: [address], [facility] Clinic Hours: Walk-in Urgent Care Dental Services are offered Monday-Friday mornings only. The numbers of emergencies accepted daily is limited to the number of providers available. Maximum 15 - Mondays, Wednesdays & Thursdays Maximum 10 - Tuesdays & Fridays Services: You do not need to be a [facility] resident to be seen for a dental emergency. Emergencies are defined as pain, swelling, abnormal bleeding, or dental trauma. Walkins will receive x-rays if needed. NOTE: Dental cleaning is not an emergency. Payment Options: PAYMENT IS DUE AT THE TIME OF SERVICE. Minimum co-pay is $40.00 for uninsured patients. Minimum co-pay is $3.00 for Medicaid with dental coverage. Dental Insurance is accepted and must be presented at time of visit. Medicare does not cover dental. Forms of payment: Cash, credit card, checks. Best way to get seen: If not previously registered with the clinic, walk-in dental registration begins at 7:15 am and is on a first come/first serve basis. If previously registered with the clinic, call to make an appointment.     [facility] [phone] [place] RESIDENTS ONLY   Location: [address], [place], [place] Clinic Hours: Mon-Thu 10a-2p Services: Extractions only! Payment Options: FREE (donations accepted) - bring proof of income or support Best way to get seen: Call and schedule an appointment OR come at 8am on the 1st Monday of every month (except for holidays) when it is first come/first served.     [facility] [phone]   Location: [address] [place], [place] Clinic Hours: Friday mornings Services, Payment Options, Best way to get seen: Call for  info

## 2021-06-02 ENCOUNTER — Emergency Department: Payer: Medicaid Other

## 2021-06-02 ENCOUNTER — Encounter: Payer: Self-pay | Admitting: Emergency Medicine

## 2021-06-02 ENCOUNTER — Emergency Department
Admission: EM | Admit: 2021-06-02 | Discharge: 2021-06-02 | Disposition: A | Discharge status: Home / Self Care | Payer: Medicaid Other | Attending: Emergency Medicine | Admitting: Emergency Medicine

## 2021-06-02 ENCOUNTER — Other Ambulatory Visit: Payer: Self-pay

## 2021-06-02 DIAGNOSIS — S46912A Strain of unspecified muscle, fascia and tendon at shoulder and upper arm level, left arm, initial encounter: Secondary | ICD-10-CM | POA: Diagnosis not present

## 2021-06-02 DIAGNOSIS — G8929 Other chronic pain: Secondary | ICD-10-CM | POA: Insufficient documentation

## 2021-06-02 DIAGNOSIS — S4992XA Unspecified injury of left shoulder and upper arm, initial encounter: Secondary | ICD-10-CM | POA: Diagnosis present

## 2021-06-02 DIAGNOSIS — Y92410 Unspecified street and highway as the place of occurrence of the external cause: Secondary | ICD-10-CM | POA: Insufficient documentation

## 2021-06-02 DIAGNOSIS — F17210 Nicotine dependence, cigarettes, uncomplicated: Secondary | ICD-10-CM | POA: Insufficient documentation

## 2021-06-02 MED ORDER — DICLOFENAC SODIUM 50 MG PO TBEC: 50.0000 mg | DELAYED_RELEASE_TABLET | Freq: Two times a day (BID) | ORAL | 0 refills | Status: AC

## 2021-06-02 MED ORDER — CYCLOBENZAPRINE HCL 5 MG PO TABS: 5.0000 mg | ORAL_TABLET | Freq: Three times a day (TID) | ORAL | 0 refills | Status: DC | PRN
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
## 2021-06-02 NOTE — Discharge Instructions (Addendum)
Your exam is benign and x-rays negative for any acute fracture.  Follow-up with your Ortho provider for ongoing management.  Take prescription meds as provided.

## 2021-09-14 ENCOUNTER — Emergency Department
Admission: EM | Admit: 2021-09-14 | Discharge: 2021-09-14 | Disposition: A | Discharge status: Home / Self Care | Payer: Medicaid Other | Attending: Emergency Medicine | Admitting: Emergency Medicine

## 2021-09-14 ENCOUNTER — Other Ambulatory Visit: Payer: Self-pay

## 2021-09-14 DIAGNOSIS — K429 Umbilical hernia without obstruction or gangrene: Secondary | ICD-10-CM | POA: Diagnosis not present

## 2021-09-14 DIAGNOSIS — F17210 Nicotine dependence, cigarettes, uncomplicated: Secondary | ICD-10-CM | POA: Diagnosis not present

## 2021-09-14 DIAGNOSIS — R109 Unspecified abdominal pain: Secondary | ICD-10-CM | POA: Diagnosis present

## 2021-09-14 LAB — COMPREHENSIVE METABOLIC PANEL
ALT: 27 U/L (ref 0–44)
AST: 23 U/L (ref 15–41)
Albumin: 3.8 g/dL (ref 3.5–5.0)
Alkaline Phosphatase: 88 U/L (ref 38–126)
Anion gap: 8 (ref 5–15)
BUN: 12 mg/dL (ref 6–20)
CO2: 28 mmol/L (ref 22–32)
Calcium: 9.3 mg/dL (ref 8.9–10.3)
Chloride: 101 mmol/L (ref 98–111)
Creatinine, Ser: 0.88 mg/dL (ref 0.61–1.24)
GFR, Estimated: >60 mL/min (ref >60)
Glucose, Bld: 93 mg/dL (ref 70–99)
Potassium: 4.1 mmol/L (ref 3.5–5.1)
Sodium: 137 mmol/L (ref 135–145)
Total Bilirubin: 0.6 mg/dL (ref 0.3–1.2)
Total Protein: 7.3 g/dL (ref 6.5–8.1)

## 2021-09-14 LAB — URINALYSIS, ROUTINE W REFLEX MICROSCOPIC
Bilirubin Urine: NEGATIVE
Glucose, UA: NEGATIVE mg/dL
Hgb urine dipstick: NEGATIVE
Ketones, ur: NEGATIVE mg/dL
Leukocytes,Ua: NEGATIVE
Nitrite: NEGATIVE
Protein, ur: NEGATIVE mg/dL
Specific Gravity, Urine: 1.025 (ref 1.005–1.030)
pH: 5.5 (ref 5.0–8.0)

## 2021-09-14 LAB — CBC
HCT: 47.5 % (ref 39.0–52.0)
Hemoglobin: 16 g/dL (ref 13.0–17.0)
MCH: 31.1 pg (ref 26.0–34.0)
MCHC: 33.7 g/dL (ref 30.0–36.0)
MCV: 92.4 fL (ref 80.0–100.0)
Platelets: 314 10*3/uL (ref 150–400)
RBC: 5.14 MIL/uL (ref 4.22–5.81)
RDW: 11.9 % (ref 11.5–15.5)
WBC: 8.9 10*3/uL (ref 4.0–10.5)
nRBC: 0 % (ref 0.0–0.2)

## 2021-09-14 LAB — LIPASE, BLOOD: Lipase: 58 U/L — ABNORMAL HIGH (ref 11–51)

## 2021-09-14 NOTE — ED Provider Notes (Signed)
[facility] Emergency Department Provider Note   ____________________________________________   Event Date/Time   First MD Initiated Contact with Patient 09/14/21 1202     (approximate)  I have reviewed the triage vital signs and the nursing notes.   HISTORY  Chief Complaint Umbilical Hernia    HPI Alex Wilkins. is a 40 y.o. male who presents for pain surrounding his umbilical hernia site  LOCATION: Umbilicus DURATION: 1 year prior to arrival TIMING: Intermittent over the past year SEVERITY: Moderate QUALITY: Aching pain CONTEXT: Patient states that over the past year he has had intermittent aching pain around his ability hernia site however over the past few days it has become increasingly worse MODIFYING FACTORS: Pain is worsened with any piece of intestine that comes up through this umbilical hernia and it is partially relieved after he can reduce it ASSOCIATED SYMPTOMS: Abdominal pain   Per medical record review, patient has history of umbilical hernia          Past Medical History:  Diagnosis Date   Atrial fibrillation with rapid ventricular response (HCC)    Cellulitis and abscess of right leg 05/15/2017   Pain following surgery or procedure    Status post laparoscopic appendectomy 08/05/2016    Patient Active Problem List   Diagnosis Date Noted   Felon of finger of right hand    Skin abscess 09/07/2018   Cellulitis of right upper extremity    MRSA (methicillin resistant staph aureus) culture positive 06/05/2017   Cellulitis and abscess of right leg 05/15/2017   Status post laparoscopic appendectomy 08/05/2016   Pain following surgery or procedure    Atrial fibrillation with rapid ventricular response [facility])     Past Surgical History:  Procedure Laterality Date   APPENDECTOMY     INCISION AND DRAINAGE ABSCESS Right 05/15/2017   Procedure: INCISION AND DRAINAGE ABSCESS-RIGHT THIGH;  Surgeon: Leafy Ro, MD;  Location:  [facility] ORS;  Service: General;  Laterality: Right;   LAPAROSCOPIC APPENDECTOMY N/A 07/19/2016   Procedure: APPENDECTOMY LAPAROSCOPIC;  Surgeon: [name], MD;  Location: [facility] ORS;  Service: General;  Laterality: N/A;    Prior to Admission medications   Medication Sig Start Date End Date Taking? Authorizing Provider  clindamycin (CLEOCIN) 150 MG capsule Take 2 caps tid until finished 06/24/20   Bridget Hartshorn L, PA-C  cyclobenzaprine (FLEXERIL) 5 MG tablet Take 1 tablet (5 mg total) by mouth 3 (three) times daily as needed. 06/02/21   Menshew, Charlesetta Ivory, PA-C    Allergies Penicillins and Hydrocodone  No family history on file.  Social History Social History   Tobacco Use   Smoking status: Some Days    Types: Cigarettes   Smokeless tobacco: Never  Vaping Use   Vaping Use: Never used  Substance Use Topics   Alcohol use: No   Drug use: No    Review of Systems Constitutional: No fever/chills Eyes: No visual changes. ENT: No sore throat. Cardiovascular: Denies chest pain. Respiratory: Denies shortness of breath. Gastrointestinal: Endorses periumbilical abdominal pain.  No nausea, no vomiting.  No diarrhea. Genitourinary: Negative for dysuria. Musculoskeletal: Negative for acute arthralgias Skin: Negative for rash. Neurological: Negative for headaches, weakness/numbness/paresthesias in any extremity Psychiatric: Negative for suicidal ideation/homicidal ideation   ____________________________________________   PHYSICAL EXAM:  VITAL SIGNS: ED Triage Vitals  Enc Vitals Group     BP 09/14/21 0955 (!) 115/99     Pulse Rate 09/14/21 0955 96     Resp 09/14/21  0955 16     Temp 09/14/21 0955 97.7 F (36.5 C)     Temp Source 09/14/21 0955 Oral     SpO2 09/14/21 0955 98 %     Weight 09/14/21 1005 200 lb (90.7 kg)     Height 09/14/21 1005 6\' 1"  (1.854 m)     Head Circumference --      Peak Flow --      Pain Score 09/14/21 1005 7     Pain Loc --      Pain Edu? --       Excl. in GC? --    Constitutional: Alert and oriented. Well appearing and in no acute distress. Eyes: Conjunctivae are normal. PERRL. Head: Atraumatic. Nose: No congestion/rhinnorhea. Mouth/Throat: Mucous membranes are moist. Neck: No stridor Cardiovascular: Grossly normal heart sounds.  Good peripheral circulation. Respiratory: Normal respiratory effort.  No retractions. Gastrointestinal: Soft and nontender. No distention.  Approximately 2 cm palpable umbilical abdominal wall defect Musculoskeletal: No obvious deformities Neurologic:  Normal speech and language. No gross focal neurologic deficits are appreciated. Skin:  Skin is warm and dry. No rash noted. Psychiatric: Mood and affect are normal. Speech and behavior are normal.  ____________________________________________   LABS (all labs ordered are listed, but only abnormal results are displayed)  Labs Reviewed  LIPASE, BLOOD - Abnormal; Notable for the following components:      Result Value   Lipase 58 (*)    All other components within normal limits  COMPREHENSIVE METABOLIC PANEL  CBC  URINALYSIS, ROUTINE W REFLEX MICROSCOPIC   PROCEDURES  Procedure(s) performed (including Critical Care):  Procedures   ____________________________________________   INITIAL IMPRESSION / ASSESSMENT AND PLAN / ED COURSE  As part of my medical decision making, I reviewed the following data within the electronic medical record, if available:  Nursing notes reviewed and incorporated, Labs reviewed, EKG interpreted, Old chart reviewed, Radiograph reviewed and Notes from prior ED visits reviewed and incorporated      Hernia sac soft. No signs of intestinal obstruction such as bilious vomiting. No changes to stooling pattern. No signs of skin changes overlying hernia sac  ED intervention: Patient placed in supine and mild Trendelenberg position with cold pack placed on the hernia site. Firm and steady pressure was applied to the  distal hernia site to reduce the hernia through the underlying anatomical defect. Pain improved.  Disposition: Advised follow up outpatient with primary care for symptom follow up within 24-48 hours and surgery clinic for future elective hernia repair. Return precautions discussed at bedside with appropriate understanding verbalized back.      ____________________________________________   FINAL CLINICAL IMPRESSION(S) / ED DIAGNOSES  Final diagnoses:  Umbilical hernia without obstruction and without gangrene     ED Discharge Orders     None        Note:  This document was prepared using Dragon voice recognition software and may include unintentional dictation errors.    08-12-1979, MD 09/14/21 [phone]

## 2021-09-14 NOTE — Discharge Instructions (Addendum)
Please use an over-the-counter "belly band" or "hernia girdle" at any local pharmacy for any continued pain surrounding this hernia.  You can may also use anti-inflammatories including ibuprofen, naproxen, or Tylenol for any continued pain

## 2021-09-14 NOTE — ED Triage Notes (Signed)
Pt c/o umbilical pain with hernia noted "for a while" states in the past 2-3 days that pain has worsened. No discoloration noted at this time

## 2021-09-14 NOTE — ED Notes (Signed)
Patient was waiting in the lobby and just now placed in room. Patient walked to room with a steady gait.

## 2021-09-20 ENCOUNTER — Emergency Department
Admission: EM | Admit: 2021-09-20 | Discharge: 2021-09-20 | Discharge status: Against Medical Advice | Payer: Medicaid Other | Attending: Emergency Medicine | Admitting: Emergency Medicine

## 2021-09-20 ENCOUNTER — Other Ambulatory Visit: Payer: Self-pay

## 2021-09-20 ENCOUNTER — Encounter: Payer: Self-pay | Admitting: Emergency Medicine

## 2021-09-20 DIAGNOSIS — R109 Unspecified abdominal pain: Secondary | ICD-10-CM | POA: Insufficient documentation

## 2021-09-20 DIAGNOSIS — Z5321 Procedure and treatment not carried out due to patient leaving prior to being seen by health care provider: Secondary | ICD-10-CM | POA: Diagnosis not present

## 2021-09-20 NOTE — ED Triage Notes (Signed)
Presents with pain to mid abd  was seen last week dx'd with hernia  scheduled to see surgeon next week  having pain  needs more pain meds

## 2021-09-20 NOTE — ED Notes (Signed)
Called times 2 for vital signs and protocols  no answer in lobby times 2

## 2021-09-29 ENCOUNTER — Encounter: Payer: Self-pay | Admitting: Surgery

## 2021-09-29 ENCOUNTER — Other Ambulatory Visit: Payer: Self-pay

## 2021-09-29 ENCOUNTER — Ambulatory Visit: Payer: Medicaid Other | Admitting: Surgery

## 2021-09-29 VITALS — BP 133/91 | HR 106 | Temp 98.0°F | Ht 73.0 in | Wt 178.1 lb

## 2021-09-29 DIAGNOSIS — K430 Incisional hernia with obstruction, without gangrene: Secondary | ICD-10-CM

## 2021-09-29 NOTE — Progress Notes (Signed)
09/29/2021  Reason for Visit: Umbilical hernia  History of Present Illness: Alex Polen. is a 41 y.o. male presenting for evaluation of an umbilical hernia.  The patient is status post laparoscopic appendectomy by me on 07/19/2016.  He was seen in the emergency room on 07/21/2016 for abdominal pain but he never presented for postoperative follow-up afterwards.  The patient reports that over the last year or perhaps 2, he has had a bulging at the umbilical incision site.  More recently, he has been becoming more uncomfortable reports that sometimes it swells and becomes harder.  He reports that the bulge is always present and does not reduce.  Reports some associated nausea at times but he is able to eat and is having bowel function.  Has any fevers, chills, chest pain, shortness of breath.  He reported to the emergency room on 09/14/2021 and at that point the hernia was able to be reduced after being placed in Trendelenburg position with pain medication and an ice pack over the hernia site.  Past Medical History: Past Medical History:  Diagnosis Date   Atrial fibrillation with rapid ventricular response ([place])    Cellulitis and abscess of right leg 05/15/2017   Pain following surgery or procedure    Status post laparoscopic appendectomy 08/05/2016     Past Surgical History: Past Surgical History:  Procedure Laterality Date   APPENDECTOMY     INCISION AND DRAINAGE ABSCESS Right 05/15/2017   Procedure: INCISION AND DRAINAGE ABSCESS-RIGHT THIGH;  Surgeon: Jules Husbands, MD;  Location: [facility] ORS;  Service: General;  Laterality: Right;   LAPAROSCOPIC APPENDECTOMY N/A 07/19/2016   Procedure: APPENDECTOMY LAPAROSCOPIC;  Surgeon: Olean Ree, MD;  Location: [facility] ORS;  Service: General;  Laterality: N/A;    Home Medications: Prior to Admission medications   Not on File    Allergies: Allergies  Allergen Reactions   Penicillins Hives    Has patient had a PCN reaction causing immediate  rash, facial/tongue/throat swelling, SOB or lightheadedness with hypotension:no Has patient had a PCN reaction causing severe rash involving mucus membranes or skin necrosis: No Has patient had a PCN reaction that required hospitalization No Has patient had a PCN reaction occurring within the last 10 years: No If all of the above answers are "NO", then may proceed with Cephalosporin use.    Hydrocodone Hives and Rash    Social History:  reports that he has been smoking cigarettes. He has never used smokeless tobacco. He reports that he does not drink alcohol and does not use drugs.   Family History: History reviewed. No pertinent family history.  Review of Systems: Review of Systems  Constitutional:  Negative for chills and fever.  HENT:  Negative for hearing loss.   Respiratory:  Negative for shortness of breath.   Cardiovascular:  Negative for chest pain.  Gastrointestinal:  Positive for abdominal pain and nausea. Negative for constipation, diarrhea and vomiting.  Genitourinary:  Negative for dysuria.  Musculoskeletal:  Negative for myalgias.  Skin:  Negative for rash.  Neurological:  Negative for dizziness.  Psychiatric/Behavioral:  Negative for depression.    Physical Exam BP (!) 133/91    Pulse (!) 106    Temp 98 F (36.7 C) (Oral)    Ht 6\' 1"  (1.854 m)    Wt 178 lb 1 oz (80.8 kg)    SpO2 98%    BMI 23.49 kg/m  CONSTITUTIONAL: No acute distress, well-nourished HEENT:  Normocephalic, atraumatic, extraocular motion intact. NECK: Trachea is midline,  and there is no jugular venous distension.  RESPIRATORY:  Lungs are clear, and breath sounds are equal bilaterally. Normal respiratory effort without pathologic use of accessory muscles. CARDIOVASCULAR: Heart is regular without murmurs, gallops, or rubs. GI: The abdomen is soft, nondistended, with some tenderness to palpation at the umbilicus.  The patient has an incarcerated incisional hernia at the prior umbilical port site.  The  hernia contents are soft with no overlying skin erythema or ulceration.  However I am unable to reduce the hernia due to patient discomfort.  I am unable to palpate the full size of the hernia defect, but in the emergency room it appeared to be about 2 cm. MUSCULOSKELETAL:  Normal muscle strength and tone in all four extremities.  No peripheral edema or cyanosis. SKIN: Skin turgor is normal. There are no pathologic skin lesions.  NEUROLOGIC:  Motor and sensation is grossly normal.  Cranial nerves are grossly intact. PSYCH:  Alert and oriented to person, place and time. Affect is normal.  Laboratory Analysis: Labs from 09/14/2021: Sodium 137, potassium 4.1, chloride 101, CO2 28, BUN 12, creatinine 0.88.  LFTs within normal, albumin 3.8, lipase 58.  WBC 8.9, hemoglobin 16, hematocrit 47.5, platelets 314.  Imaging: No results found.  Assessment and Plan: This is a 41 y.o. male with an incarcerated umbilical hernia.  - On the patient's visit to the emergency room on 09/14/2021 hernia was able to be reduced with improvement in the patient's discomfort.  Today I am unable to reduce it due to patient discomfort.  I am unable to place him in Trendelenburg position or provide him with any pain medication to help.  However the hernia sac contents are soft and there is no overlying skin changes to suggest strangulation.  The hernia sac contains small bowel as the patient not showing any signs of bowel obstruction.  However given the patient's symptoms, I do recommend that we proceed with surgical repair. - Discussed with him the proposed surgery for robotic assisted incisional hernia repair.  Reviewed with him the surgery at length including the risks of bleeding, infection, injury to surrounding structures, that this an outpatient procedure, postoperative activity restrictions, pain control, and he is willing to proceed. - We will schedule him for surgery on 10/17/2021.  I spent 60 minutes dedicated to the  care of this patient on the date of this encounter to include pre-visit review of records, face-to-face time with the patient discussing diagnosis and management, and any post-visit coordination of care.   Melvyn Neth, [facility]

## 2021-09-29 NOTE — Patient Instructions (Signed)
Our surgery scheduler will call you within 24-48 hours to schedule your surgery.Please have the Blue surgery sheet available when speaking with her.   Umbilical Hernia, Adult A hernia is a bulge of tissue that pushes through an opening between muscles. An umbilical hernia happens in the abdomen, near the belly button (umbilicus). The hernia may contain tissues from the small intestine, large intestine, or fatty tissue covering the intestines. Umbilical hernias in adults tend to get worse over time, and they require surgical treatment. There are different types of umbilical hernias, including: Indirect hernia. This type is located just above or below the umbilicus. It is the most common type of umbilical hernia in adults. Direct hernia. This type forms through an opening formed by the umbilicus. Reducible hernia. This type of hernia comes and goes. It may be visible only when you strain, lift something heavy, or cough. This type of hernia can be pushed back into the abdomen (reduced). Incarcerated hernia. This type traps abdominal tissue inside the hernia. This type of hernia cannot be reduced. Strangulated hernia. This type of hernia cuts off blood flow to the tissues inside the hernia. The tissues can start to die if this happens. This type of hernia requires emergency treatment. What are the causes? An umbilical hernia happens when tissue inside the abdomen presses on a weak area of the abdominal muscles. What increases the risk? You may have a greater risk of this condition if you: Are obese. Have had several pregnancies. Have a buildup of fluid inside your abdomen. Have had surgery that weakens the abdominal muscles. What are the signs or symptoms? The main symptom of this condition is a painless bulge at or near the belly button. A reducible hernia may be visible only when you strain, lift something heavy, or cough. Other symptoms may include: Dull pain. A feeling of pressure. Symptoms of  a strangulated hernia may include: Pain that gets increasingly worse. Nausea and vomiting. Pain when pressing on the hernia. Skin over the hernia becoming red or purple. Constipation. Blood in the stool. How is this diagnosed? This condition may be diagnosed based on: A physical exam. You may be asked to cough or strain while standing. These actions increase the pressure inside your abdomen and can force the hernia through the opening in your muscles. Your health care provider may try to reduce the hernia by pressing on it. Your symptoms and medical history. How is this treated? Surgery is the only treatment for an umbilical hernia. Surgery for a strangulated hernia is done as soon as possible. If you have a small hernia that is not incarcerated, you may need to lose weight before having surgery. Follow these instructions at home: Lose weight, if told by your health care provider. Do not try to push the hernia back in. Watch your hernia for any changes in color or size. Tell your health care provider if any changes occur. You may need to avoid activities that increase pressure on your hernia. Do not lift anything that is heavier than 10 lb (4.5 kg), or the limit that you are told, until your health care provider says that it is safe. Take over-the-counter and prescription medicines only as told by your health care provider. Keep all follow-up visits. This is important. Contact a health care provider if: Your hernia gets larger. Your hernia becomes painful. Get help right away if: You develop sudden, severe pain near the area of your hernia. You have pain as well as nausea or vomiting.   You have pain and the skin over your hernia changes color. You develop a fever or chills. Summary A hernia is a bulge of tissue that pushes through an opening between muscles. An umbilical hernia happens near the belly button. Surgery is the only treatment for an umbilical hernia. Do not try to push your  hernia back in. Keep all follow-up visits. This is important. This information is not intended to replace advice given to you by your health care provider. Make sure you discuss any questions you have with your health care provider. Document Revised: 04/18/2020 Document Reviewed: 04/18/2020 Elsevier Patient Education  2022 Elsevier Inc.  

## 2021-09-29 NOTE — H&P (View-Only) (Signed)
09/29/2021  Reason for Visit: Umbilical hernia  History of Present Illness: Alex Wilkins. is a 41 y.o. male presenting for evaluation of an umbilical hernia.  The patient is status post laparoscopic appendectomy by me on 07/19/2016.  He was seen in the emergency room on 07/21/2016 for abdominal pain but he never presented for postoperative follow-up afterwards.  The patient reports that over the last year or perhaps 2, he has had a bulging at the umbilical incision site.  More recently, he has been becoming more uncomfortable reports that sometimes it swells and becomes harder.  He reports that the bulge is always present and does not reduce.  Reports some associated nausea at times but he is able to eat and is having bowel function.  Has any fevers, chills, chest pain, shortness of breath.  He reported to the emergency room on 09/14/2021 and at that point the hernia was able to be reduced after being placed in Trendelenburg position with pain medication and an ice pack over the hernia site.  Past Medical History: Past Medical History:  Diagnosis Date   Atrial fibrillation with rapid ventricular response ([place])    Cellulitis and abscess of right leg 05/15/2017   Pain following surgery or procedure    Status post laparoscopic appendectomy 08/05/2016     Past Surgical History: Past Surgical History:  Procedure Laterality Date   APPENDECTOMY     INCISION AND DRAINAGE ABSCESS Right 05/15/2017   Procedure: INCISION AND DRAINAGE ABSCESS-RIGHT THIGH;  Surgeon: Jules Husbands, MD;  Location: [facility] ORS;  Service: General;  Laterality: Right;   LAPAROSCOPIC APPENDECTOMY N/A 07/19/2016   Procedure: APPENDECTOMY LAPAROSCOPIC;  Surgeon: Olean Ree, MD;  Location: [facility] ORS;  Service: General;  Laterality: N/A;    Home Medications: Prior to Admission medications   Not on File    Allergies: Allergies  Allergen Reactions   Penicillins Hives    Has patient had a PCN reaction causing immediate  rash, facial/tongue/throat swelling, SOB or lightheadedness with hypotension:no Has patient had a PCN reaction causing severe rash involving mucus membranes or skin necrosis: No Has patient had a PCN reaction that required hospitalization No Has patient had a PCN reaction occurring within the last 10 years: No If all of the above answers are "NO", then may proceed with Cephalosporin use.    Hydrocodone Hives and Rash    Social History:  reports that he has been smoking cigarettes. He has never used smokeless tobacco. He reports that he does not drink alcohol and does not use drugs.   Family History: History reviewed. No pertinent family history.  Review of Systems: Review of Systems  Constitutional:  Negative for chills and fever.  HENT:  Negative for hearing loss.   Respiratory:  Negative for shortness of breath.   Cardiovascular:  Negative for chest pain.  Gastrointestinal:  Positive for abdominal pain and nausea. Negative for constipation, diarrhea and vomiting.  Genitourinary:  Negative for dysuria.  Musculoskeletal:  Negative for myalgias.  Skin:  Negative for rash.  Neurological:  Negative for dizziness.  Psychiatric/Behavioral:  Negative for depression.    Physical Exam BP (!) 133/91    Pulse (!) 106    Temp 98 F (36.7 C) (Oral)    Ht 6\' 1"  (1.854 m)    Wt 178 lb 1 oz (80.8 kg)    SpO2 98%    BMI 23.49 kg/m  CONSTITUTIONAL: No acute distress, well-nourished HEENT:  Normocephalic, atraumatic, extraocular motion intact. NECK: Trachea is midline,  and there is no jugular venous distension.  RESPIRATORY:  Lungs are clear, and breath sounds are equal bilaterally. Normal respiratory effort without pathologic use of accessory muscles. CARDIOVASCULAR: Heart is regular without murmurs, gallops, or rubs. GI: The abdomen is soft, nondistended, with some tenderness to palpation at the umbilicus.  The patient has an incarcerated incisional hernia at the prior umbilical port site.  The  hernia contents are soft with no overlying skin erythema or ulceration.  However I am unable to reduce the hernia due to patient discomfort.  I am unable to palpate the full size of the hernia defect, but in the emergency room it appeared to be about 2 cm. MUSCULOSKELETAL:  Normal muscle strength and tone in all four extremities.  No peripheral edema or cyanosis. SKIN: Skin turgor is normal. There are no pathologic skin lesions.  NEUROLOGIC:  Motor and sensation is grossly normal.  Cranial nerves are grossly intact. PSYCH:  Alert and oriented to person, place and time. Affect is normal.  Laboratory Analysis: Labs from 09/14/2021: Sodium 137, potassium 4.1, chloride 101, CO2 28, BUN 12, creatinine 0.88.  LFTs within normal, albumin 3.8, lipase 58.  WBC 8.9, hemoglobin 16, hematocrit 47.5, platelets 314.  Imaging: No results found.  Assessment and Plan: This is a 41 y.o. male with an incarcerated umbilical hernia.  - On the patient's visit to the emergency room on 09/14/2021 hernia was able to be reduced with improvement in the patient's discomfort.  Today I am unable to reduce it due to patient discomfort.  I am unable to place him in Trendelenburg position or provide him with any pain medication to help.  However the hernia sac contents are soft and there is no overlying skin changes to suggest strangulation.  The hernia sac contains small bowel as the patient not showing any signs of bowel obstruction.  However given the patient's symptoms, I do recommend that we proceed with surgical repair. - Discussed with him the proposed surgery for robotic assisted incisional hernia repair.  Reviewed with him the surgery at length including the risks of bleeding, infection, injury to surrounding structures, that this an outpatient procedure, postoperative activity restrictions, pain control, and he is willing to proceed. - We will schedule him for surgery on 10/17/2021.  I spent 60 minutes dedicated to the  care of this patient on the date of this encounter to include pre-visit review of records, face-to-face time with the patient discussing diagnosis and management, and any post-visit coordination of care.   Melvyn Neth, [facility]

## 2021-10-02 ENCOUNTER — Telehealth: Payer: Self-pay | Admitting: Surgery

## 2021-10-02 NOTE — Telephone Encounter (Signed)
Patient has been advised of Pre-Admission date/time, COVID Testing date and Surgery date.  Surgery Date: 10/17/21 Preadmission Testing Date: 10/09/21 (phone 1p-5p) Covid Testing Date: Not needed.     Patient has been made aware to call [phone], between 1-3:00pm the day before surgery, to find out what time to arrive for surgery.

## 2021-10-09 ENCOUNTER — Encounter
Admission: RE | Admit: 2021-10-09 | Discharge: 2021-10-09 | Disposition: A | Discharge status: Home / Self Care | Payer: Medicaid Other | Source: Ambulatory Visit | Attending: Surgery | Admitting: Surgery

## 2021-10-09 ENCOUNTER — Other Ambulatory Visit: Payer: Self-pay

## 2021-10-09 NOTE — Patient Instructions (Addendum)
Your procedure is scheduled on: Tuesday, January 24 Report to the Registration Desk on the 1st floor of the [facility] To find out your arrival time, please call [phone] between 1PM - 3PM on: Monday, January 23  REMEMBER: Instructions that are not followed completely may result in serious medical risk, up to and including death; or upon the discretion of your surgeon and anesthesiologist your surgery may need to be rescheduled.  Do not eat food after midnight the night before surgery.  No gum chewing, lozengers or hard candies.  You may however, drink CLEAR liquids up to 2 hours before you are scheduled to arrive for your surgery. Do not drink anything within 2 hours of your scheduled arrival time.  Clear liquids include: - water  - apple juice without pulp - gatorade (not RED, PURPLE, OR BLUE) - black coffee or tea (Do NOT add milk or creamers to the coffee or tea) Do NOT drink anything that is not on this list.  DO NOT TAKE ANY MEDICATIONS THE MORNING OF SURGERY  One week prior to surgery: starting January 17 Stop Anti-inflammatories (NSAIDS) such as Advil, Aleve, Ibuprofen, Motrin, Naproxen, Naprosyn and Aspirin based products such as Excedrin, Goodys Powder, BC Powder. Stop ANY OVER THE COUNTER supplements until after surgery. You may however, continue to take Tylenol if needed for pain up until the day of surgery.  No Alcohol for 24 hours before or after surgery.  No Smoking including e-cigarettes for 24 hours prior to surgery.  No chewable tobacco products for at least 6 hours prior to surgery.  No nicotine patches on the day of surgery.  Do not use any "recreational" drugs for at least a week prior to your surgery.  Please be advised that the combination of cocaine and anesthesia may have negative outcomes, up to and including death. If you test positive for cocaine, your surgery will be cancelled.  On the morning of surgery brush your teeth with toothpaste and  water, you may rinse your mouth with mouthwash if you wish. Do not swallow any toothpaste or mouthwash.  Use CHG Soap as directed on instruction sheet.  Do not wear jewelry, make-up, hairpins, clips or nail polish.  Do not wear lotions, powders, or perfumes.   Do not shave body from the neck down 48 hours prior to surgery just in case you cut yourself which could leave a site for infection.  Also, freshly shaved skin may become irritated if using the CHG soap.  Contact lenses, hearing aids and dentures may not be worn into surgery.  Do not bring valuables to the hospital. [facility] is not responsible for any missing/lost belongings or valuables.   Notify your doctor if there is any change in your medical condition (cold, fever, infection).  Wear comfortable clothing (specific to your surgery type) to the hospital.  After surgery, you can help prevent lung complications by doing breathing exercises.  Take deep breaths and cough every 1-2 hours. Your doctor may order a device called an Incentive Spirometer to help you take deep breaths. When coughing or sneezing, hold a pillow firmly against your incision with both hands. This is called splinting. Doing this helps protect your incision. It also decreases belly discomfort.  If you are being discharged the day of surgery, you will not be allowed to drive home. You will need a responsible adult (18 years or older) to drive you home and stay with you that night.   If you are taking public  transportation, you will need to have a responsible adult (18 years or older) with you. Please confirm with your physician that it is acceptable to use public transportation.   Please call the [facility] at [phone] if you have any questions about these instructions.  Surgery Visitation Policy:  Patients undergoing a surgery or procedure may have one family member or support person with them as long as that person is not  COVID-19 positive or experiencing its symptoms.  That person may remain in the waiting area during the procedure and may rotate out with other people.

## 2021-10-09 NOTE — Progress Notes (Signed)
°  Perioperative Services Pre-Admission/Anesthesia Testing   Date: 10/09/21 Name: Alex Wilkins. MRN:   [id number]  Re: Consideration of preoperative prophylactic antibiotic change   Request sent to: [name], MD (routed and/or faxed via [facility])  Planned Surgical Procedure(s):    Case: [id number] Date/Time: 10/17/21 0908   Procedure: XI ROBOTIC ASSISTED VENTRAL HERNIA, incisional incarcerated   Anesthesia type: General   Pre-op diagnosis: incisional hernia, incarcerated   Location: [facility] OR ROOM 04 / [facility]   Surgeons: [name], MD   Clinical Notes:  Patient has a documented allergy to PCN  Advising that PCN has caused him to experience urticarial rash in the past.   Screened as appropriate for cephalosporin use during medication reconciliation No immediate angioedema, dysphagia, SOB, anaphylaxis symptoms. No severe rash involving mucous membranes or skin necrosis. No hospital admissions related to side effects of PCN/cephalosporin use.  No documented reaction to PCN or cephalosporin in the last 10 years.  Request:  As an evidence based approach to reducing the rate of incidence for post-operative SSI and the development of MDROs, could an agent with narrower coverage for preoperative prophylaxis in this patient's upcoming surgical course be considered?   Currently ordered preoperative prophylactic ABX: clindamycin.   Specifically requesting change to cephalosporin (CEFAZOLIN).   Please communicate decision with me and I will change the orders in Epic as per your direction.   Things to consider: Many patients report that they were "allergic" to PCN earlier in life, however this does not translate into a true lifelong allergy. Patients can lose sensitivity to specific IgE antibodies over time if PCN is avoided (Kleris & Lugar, 2019).  Up to 10% of the adult population and 15% of hospitalized patients report an allergy to PCN, however clinical  studies suggest that 90% of those reporting an allergy can tolerate PCN antibiotics (Kleris & Lugar, 2019).  Cross-sensitivity between PCN and cephalosporins has been documented as being as high as 10%, however this estimation included data believed to have been collected in a setting where there was contamination. Newer data suggests that the prevalence of cross-sensitivity between PCN and cephalosporins is actually estimated to be closer to 1% (Hermanides et al., 2018).   Patients labeled as PCN allergic, whether they are truly allergic or not, have been found to have inferior outcomes in terms of rates of serious infection, and these patients tend to have longer hospital stays [facility] & Lugar, 2019).  Treatment related secondary infections, such as Clostridioides difficile, have been linked to the improper use of broad spectrum antibiotics in patients improperly labeled as PCN allergic (Kleris & Lugar, 2019).  Anaphylaxis from cephalosporins is rare and the evidence suggests that there is no increased risk of an anaphylactic type reaction when cephalosporins are used in a PCN allergic patient (Pichichero, 2006).  Citations: Hermanides J, Lemkes BA, Prins Gwenyth Bender MW, Terreehorst I. Presumed ?-Lactam Allergy and Cross-reactivity in the Operating Theater: A Practical Approach. Anesthesiology. 2018 Aug;129(2):335-342. doi: 10.1097/ALN.0000000000002252. PMID: 91478295.  Kleris, R. S., & Lugar, P. L. (2019). Things We Do For No Reason: Failing to Question a Penicillin Allergy History. Journal of hospital medicine, 14(10), [phone]. Advance online publication. airportbarriers.com  Pichichero, M. E. (2006). Cephalosporins can be prescribed safely for penicillin-allergic patients. Journal of family medicine, 55(2), 106-112. Accessed: https://cdn.mdedge.com/files/s92fs-public/Document/September-2017/5502JFP_AppliedEvidence1.pdf   Quentin Mulling, MSN, APRN, FNP-C, CEN [facility]   Peri-operative Services Nurse Practitioner FAX: [phone] 10/09/21 2:55 PM

## 2021-10-10 NOTE — Progress Notes (Signed)
°  Perioperative Services Pre-Admission/Anesthesia Testing     Date: 10/10/21  Name: Alex Wilkins. MRN:   [id number]  Re: Change in ABX for upcoming surgery   Case: [id number] Date/Time: 10/17/21 0908   Procedure: XI ROBOTIC ASSISTED VENTRAL HERNIA, incisional incarcerated   Anesthesia type: General   Pre-op diagnosis: incisional hernia, incarcerated   Location: [facility] OR ROOM 04 / [facility]   Surgeons: [name], MD   Primary attending surgeon was consulted regarding consideration of therapeutic change in antimicrobial agent being used for preoperative prophylaxis in this patient's upcoming surgical case. Following analysis of the risk versus benefits, the patient's primary attending surgeon advised that it would be acceptable to discontinue the ordered clindamycin and place an order for cefazolin 2 gm IV on call to the OR. Orders for this patient were amended by me following collaborative conversation with attending surgeon taking into consideration of risk versus benefits associated with the change in therapy.  Quentin Mulling, MSN, APRN, FNP-C, CEN [facility]  Peri-operative Services Nurse Practitioner Phone: [phone] 10/10/21 10:54 AM

## 2021-10-17 ENCOUNTER — Other Ambulatory Visit: Payer: Self-pay

## 2021-10-17 ENCOUNTER — Encounter: Payer: Self-pay | Admitting: Surgery

## 2021-10-17 ENCOUNTER — Encounter
Admission: RE | Disposition: A | Discharge status: Home / Self Care | Payer: Self-pay | Source: Home / Self Care | Attending: Surgery

## 2021-10-17 ENCOUNTER — Ambulatory Visit
Admission: RE | Admit: 2021-10-17 | Discharge: 2021-10-17 | Disposition: A | Discharge status: Home / Self Care | Payer: Medicaid Other | Attending: Surgery | Admitting: Surgery

## 2021-10-17 ENCOUNTER — Ambulatory Visit: Payer: Medicaid Other | Admitting: Urgent Care

## 2021-10-17 DIAGNOSIS — K430 Incisional hernia with obstruction, without gangrene: Secondary | ICD-10-CM | POA: Insufficient documentation

## 2021-10-17 DIAGNOSIS — F17200 Nicotine dependence, unspecified, uncomplicated: Secondary | ICD-10-CM | POA: Diagnosis not present

## 2021-10-17 DIAGNOSIS — Z9049 Acquired absence of other specified parts of digestive tract: Secondary | ICD-10-CM | POA: Diagnosis not present

## 2021-10-17 HISTORY — PX: XI ROBOTIC ASSISTED VENTRAL HERNIA: SHX6789

## 2021-10-17 HISTORY — PX: INSERTION OF MESH: SHX5868

## 2021-10-17 LAB — URINE DRUG SCREEN, QUALITATIVE (ARMC ONLY)
Amphetamines, Ur Screen: NOT DETECTED
Barbiturates, Ur Screen: NOT DETECTED
Benzodiazepine, Ur Scrn: NOT DETECTED
Cannabinoid 50 Ng, Ur ~~LOC~~: POSITIVE — AB
Cocaine Metabolite,Ur ~~LOC~~: NOT DETECTED
MDMA (Ecstasy)Ur Screen: NOT DETECTED
Methadone Scn, Ur: NOT DETECTED
Opiate, Ur Screen: NOT DETECTED
Phencyclidine (PCP) Ur S: NOT DETECTED
Tricyclic, Ur Screen: NOT DETECTED

## 2021-10-17 SURGERY — REPAIR, HERNIA, VENTRAL, ROBOT-ASSISTED
Anesthesia: General

## 2021-10-17 MED ORDER — ROCURONIUM BROMIDE 100 MG/10ML IV SOLN
INTRAVENOUS | Status: DC | PRN
  Administered 2021-10-17: 50 mg via INTRAVENOUS
  Administered 2021-10-17: 20 mg via INTRAVENOUS
  Administered 2021-10-17: 10 mg via INTRAVENOUS

## 2021-10-17 MED ORDER — DEXAMETHASONE SODIUM PHOSPHATE 10 MG/ML IJ SOLN
INTRAMUSCULAR | Status: AC
  Filled 2021-10-17: qty 1

## 2021-10-17 MED ORDER — FENTANYL CITRATE (PF) 100 MCG/2ML IJ SOLN: 25.0000 ug | INTRAMUSCULAR | Status: DC | PRN

## 2021-10-17 MED ORDER — BUPIVACAINE LIPOSOME 1.3 % IJ SUSP: 20.0000 mL | Freq: Once | INTRAMUSCULAR | Status: DC

## 2021-10-17 MED ORDER — VASOPRESSIN 20 UNIT/ML IV SOLN
INTRAVENOUS | Status: AC
  Filled 2021-10-17: qty 1

## 2021-10-17 MED ORDER — PROPOFOL 10 MG/ML IV BOLUS
INTRAVENOUS | Status: DC | PRN
Start: 2021-10-17 — End: 2021-10-17
  Administered 2021-10-17: 200 mg via INTRAVENOUS

## 2021-10-17 MED ORDER — CEFAZOLIN SODIUM-DEXTROSE 2-4 GM/100ML-% IV SOLN
2.0000 g | Freq: Once | INTRAVENOUS | Status: AC
Start: 2021-10-17 — End: 2021-10-17
  Administered 2021-10-17: 09:00:00 2 g via INTRAVENOUS

## 2021-10-17 MED ORDER — KETAMINE HCL 10 MG/ML IJ SOLN
INTRAMUSCULAR | Status: DC | PRN
  Administered 2021-10-17: 20 mg via INTRAVENOUS
  Administered 2021-10-17 (×2): 10 mg via INTRAVENOUS

## 2021-10-17 MED ORDER — SUGAMMADEX SODIUM 200 MG/2ML IV SOLN
INTRAVENOUS | Status: DC | PRN
Start: 2021-10-17 — End: 2021-10-17
  Administered 2021-10-17: 100 mg via INTRAVENOUS

## 2021-10-17 MED ORDER — BUPIVACAINE-EPINEPHRINE (PF) 0.5% -1:200000 IJ SOLN
INTRAMUSCULAR | Status: AC
  Filled 2021-10-17: qty 30

## 2021-10-17 MED ORDER — GABAPENTIN 300 MG PO CAPS
300.0000 mg | ORAL_CAPSULE | ORAL | Status: AC
  Administered 2021-10-17: 09:00:00 300 mg via ORAL

## 2021-10-17 MED ORDER — CEFAZOLIN SODIUM-DEXTROSE 2-4 GM/100ML-% IV SOLN
INTRAVENOUS | Status: AC
  Filled 2021-10-17: qty 100

## 2021-10-17 MED ORDER — ACETAMINOPHEN 10 MG/ML IV SOLN: 1000.0000 mg | Freq: Once | INTRAVENOUS | Status: DC | PRN

## 2021-10-17 MED ORDER — OXYCODONE HCL 5 MG PO TABS
ORAL_TABLET | ORAL | Status: AC
  Filled 2021-10-17: qty 1

## 2021-10-17 MED ORDER — FAMOTIDINE 20 MG PO TABS
20.0000 mg | ORAL_TABLET | Freq: Once | ORAL | Status: AC
  Administered 2021-10-17: 09:00:00 20 mg via ORAL

## 2021-10-17 MED ORDER — ONDANSETRON HCL 4 MG/2ML IJ SOLN
INTRAMUSCULAR | Status: AC
  Filled 2021-10-17: qty 2

## 2021-10-17 MED ORDER — LIDOCAINE HCL (CARDIAC) PF 100 MG/5ML IV SOSY
PREFILLED_SYRINGE | INTRAVENOUS | Status: DC | PRN
Start: 2021-10-17 — End: 2021-10-17
  Administered 2021-10-17: 100 mg via INTRAVENOUS

## 2021-10-17 MED ORDER — HYDROMORPHONE HCL 1 MG/ML IJ SOLN
INTRAMUSCULAR | Status: AC
  Filled 2021-10-17: qty 1

## 2021-10-17 MED ORDER — FENTANYL CITRATE (PF) 100 MCG/2ML IJ SOLN
INTRAMUSCULAR | Status: AC
  Filled 2021-10-17: qty 2

## 2021-10-17 MED ORDER — KETAMINE HCL 50 MG/5ML IJ SOSY
PREFILLED_SYRINGE | INTRAMUSCULAR | Status: AC
  Filled 2021-10-17: qty 5

## 2021-10-17 MED ORDER — CHLORHEXIDINE GLUCONATE 0.12 % MT SOLN
OROMUCOSAL | Status: AC
  Administered 2021-10-17: 09:00:00 15 mL
  Filled 2021-10-17: qty 15

## 2021-10-17 MED ORDER — ACETAMINOPHEN 500 MG PO TABS: 1000.0000 mg | ORAL_TABLET | ORAL | Status: AC

## 2021-10-17 MED ORDER — MIDAZOLAM HCL 2 MG/2ML IJ SOLN
INTRAMUSCULAR | Status: AC
  Filled 2021-10-17: qty 2

## 2021-10-17 MED ORDER — LACTATED RINGERS IV SOLN: INTRAVENOUS | Status: DC

## 2021-10-17 MED ORDER — ACETAMINOPHEN 500 MG PO TABS: 1000.0000 mg | ORAL_TABLET | Freq: Four times a day (QID) | ORAL | Status: AC | PRN

## 2021-10-17 MED ORDER — LACTATED RINGERS IV SOLN
INTRAVENOUS | Status: DC
Start: 2021-10-17 — End: 2021-10-17

## 2021-10-17 MED ORDER — ACETAMINOPHEN 500 MG PO TABS
ORAL_TABLET | ORAL | Status: AC
  Administered 2021-10-17: 09:00:00 1000 mg via ORAL
  Filled 2021-10-17: qty 2

## 2021-10-17 MED ORDER — HYDROMORPHONE HCL 1 MG/ML IJ SOLN
INTRAMUSCULAR | Status: DC | PRN
  Administered 2021-10-17 (×2): .5 mg via INTRAVENOUS

## 2021-10-17 MED ORDER — LIDOCAINE HCL (PF) 2 % IJ SOLN
INTRAMUSCULAR | Status: AC
  Filled 2021-10-17: qty 5

## 2021-10-17 MED ORDER — IBUPROFEN 800 MG PO TABS: 800.0000 mg | ORAL_TABLET | Freq: Three times a day (TID) | ORAL | 1 refills | Status: AC | PRN

## 2021-10-17 MED ORDER — GABAPENTIN 100 MG PO CAPS: 200.0000 mg | ORAL_CAPSULE | Freq: Three times a day (TID) | ORAL | 0 refills | Status: AC

## 2021-10-17 MED ORDER — FAMOTIDINE 20 MG PO TABS
ORAL_TABLET | ORAL | Status: AC
  Filled 2021-10-17: qty 1

## 2021-10-17 MED ORDER — ROCURONIUM BROMIDE 10 MG/ML (PF) SYRINGE
PREFILLED_SYRINGE | INTRAVENOUS | Status: AC
  Filled 2021-10-17: qty 10

## 2021-10-17 MED ORDER — OXYCODONE HCL 5 MG PO TABS: 5.0000 mg | ORAL_TABLET | ORAL | 0 refills | Status: DC | PRN

## 2021-10-17 MED ORDER — OXYCODONE HCL 5 MG PO TABS
5.0000 mg | ORAL_TABLET | Freq: Once | ORAL | Status: AC
  Administered 2021-10-17: 13:00:00 5 mg via ORAL

## 2021-10-17 MED ORDER — PROPOFOL 10 MG/ML IV BOLUS
INTRAVENOUS | Status: AC
  Filled 2021-10-17: qty 40

## 2021-10-17 MED ORDER — PHENYLEPHRINE HCL-NACL 20-0.9 MG/250ML-% IV SOLN
INTRAVENOUS | Status: DC | PRN
  Administered 2021-10-17: 35 ug/min via INTRAVENOUS

## 2021-10-17 MED ORDER — DEXAMETHASONE SODIUM PHOSPHATE 10 MG/ML IJ SOLN
INTRAMUSCULAR | Status: DC | PRN
  Administered 2021-10-17: 10 mg via INTRAVENOUS

## 2021-10-17 MED ORDER — MIDAZOLAM HCL 2 MG/2ML IJ SOLN
INTRAMUSCULAR | Status: DC | PRN
  Administered 2021-10-17: 2 mg via INTRAVENOUS

## 2021-10-17 MED ORDER — DEXMEDETOMIDINE HCL IN NACL 200 MCG/50ML IV SOLN
INTRAVENOUS | Status: AC
  Filled 2021-10-17: qty 50

## 2021-10-17 MED ORDER — FENTANYL CITRATE (PF) 100 MCG/2ML IJ SOLN
INTRAMUSCULAR | Status: DC | PRN
Start: 2021-10-17 — End: 2021-10-17
  Administered 2021-10-17 (×2): 50 ug via INTRAVENOUS

## 2021-10-17 MED ORDER — GABAPENTIN 300 MG PO CAPS
ORAL_CAPSULE | ORAL | Status: AC
  Filled 2021-10-17: qty 1

## 2021-10-17 MED ORDER — BUPIVACAINE-EPINEPHRINE 0.5% -1:200000 IJ SOLN
INTRAMUSCULAR | Status: DC | PRN
  Administered 2021-10-17: 11:00:00 50 mL via SURGICAL_CAVITY

## 2021-10-17 MED ORDER — KETOROLAC TROMETHAMINE 30 MG/ML IJ SOLN
INTRAMUSCULAR | Status: DC | PRN
  Administered 2021-10-17: 30 mg via INTRAVENOUS

## 2021-10-17 MED ORDER — DEXMEDETOMIDINE (PRECEDEX) IN NS 20 MCG/5ML (4 MCG/ML) IV SYRINGE
PREFILLED_SYRINGE | INTRAVENOUS | Status: DC | PRN
  Administered 2021-10-17: 5 ug via INTRAVENOUS
  Administered 2021-10-17: 10 ug via INTRAVENOUS
  Administered 2021-10-17: 12 ug via INTRAVENOUS
  Administered 2021-10-17: 5 ug via INTRAVENOUS

## 2021-10-17 MED ORDER — ONDANSETRON HCL 4 MG/2ML IJ SOLN
INTRAMUSCULAR | Status: DC | PRN
Start: 2021-10-17 — End: 2021-10-17
  Administered 2021-10-17: 4 mg via INTRAVENOUS

## 2021-10-17 MED ORDER — VASOPRESSIN 20 UNIT/ML IV SOLN
INTRAVENOUS | Status: DC | PRN
  Administered 2021-10-17: 1 [IU] via INTRAVENOUS
  Administered 2021-10-17: 2 [IU] via INTRAVENOUS
  Administered 2021-10-17: 1 [IU] via INTRAVENOUS
  Administered 2021-10-17: 2 [IU] via INTRAVENOUS

## 2021-10-17 MED ORDER — BUPIVACAINE LIPOSOME 1.3 % IJ SUSP
INTRAMUSCULAR | Status: AC
  Filled 2021-10-17: qty 20

## 2021-10-17 MED ORDER — PHENYLEPHRINE HCL (PRESSORS) 10 MG/ML IV SOLN
INTRAVENOUS | Status: DC | PRN
Start: 2021-10-17 — End: 2021-10-17
  Administered 2021-10-17 (×5): 160 ug via INTRAVENOUS

## 2021-10-17 MED ORDER — ONDANSETRON HCL 4 MG/2ML IJ SOLN: 4.0000 mg | Freq: Once | INTRAMUSCULAR | Status: DC | PRN

## 2021-10-17 SURGICAL SUPPLY — 66 items
"PENCIL ELECTRO HAND CTR " (MISCELLANEOUS) ×2 IMPLANT
ADH SKN CLS APL DERMABOND .7 (GAUZE/BANDAGES/DRESSINGS) ×2
ANCHOR TIS RET SYS 235ML (MISCELLANEOUS) ×2 IMPLANT
APL PRP STRL LF DISP 70% ISPRP (MISCELLANEOUS)
BAG TISS RTRVL C235 10X14 (MISCELLANEOUS) ×2
BLADE SURG SZ11 CARB STEEL (BLADE) ×4 IMPLANT
CANNULA REDUC XI 12-8 STAPL (CANNULA) ×1
CANNULA REDUC XI 12-8MM STAPL (CANNULA) ×1
CANNULA REDUCER 12-8 DVNC XI (CANNULA) ×2 IMPLANT
CHLORAPREP W/TINT 26 (MISCELLANEOUS) ×2 IMPLANT
COVER TIP SHEARS 8 DVNC (MISCELLANEOUS) ×2 IMPLANT
COVER TIP SHEARS 8MM DA VINCI (MISCELLANEOUS) ×2
DEFOGGER SCOPE WARMER CLEARIFY (MISCELLANEOUS) ×4 IMPLANT
DERMABOND ADVANCED (GAUZE/BANDAGES/DRESSINGS) ×2
DERMABOND ADVANCED .7 DNX12 (GAUZE/BANDAGES/DRESSINGS) ×2 IMPLANT
DRAPE ARM DVNC X/XI (DISPOSABLE) ×6 IMPLANT
DRAPE COLUMN DVNC XI (DISPOSABLE) ×2 IMPLANT
DRAPE DA VINCI XI ARM (DISPOSABLE) ×6
DRAPE DA VINCI XI COLUMN (DISPOSABLE) ×2
ELECT CAUTERY BLADE TIP 2.5 (TIP) ×4
ELECT REM PT RETURN 9FT ADLT (ELECTROSURGICAL) ×4
ELECTRODE CAUTERY BLDE TIP 2.5 (TIP) ×2 IMPLANT
ELECTRODE REM PT RTRN 9FT ADLT (ELECTROSURGICAL) ×2 IMPLANT
GLOVE SURG SYN 7.0 (GLOVE) ×20 IMPLANT
GLOVE SURG SYN 7.0 PF PI (GLOVE) ×4 IMPLANT
GLOVE SURG SYN 7.5  E (GLOVE) ×10
GLOVE SURG SYN 7.5 E (GLOVE) ×10 IMPLANT
GLOVE SURG SYN 7.5 PF PI (GLOVE) ×4 IMPLANT
GOWN STRL REUS W/ TWL LRG LVL3 (GOWN DISPOSABLE) ×6 IMPLANT
GOWN STRL REUS W/TWL LRG LVL3 (GOWN DISPOSABLE) ×20
GRASPER SUT TROCAR 14GX15 (MISCELLANEOUS) ×4 IMPLANT
IRRIGATION STRYKERFLOW (MISCELLANEOUS) IMPLANT
IRRIGATOR STRYKERFLOW (MISCELLANEOUS)
IV NS 1000ML (IV SOLUTION)
IV NS 1000ML BAXH (IV SOLUTION) IMPLANT
KIT PINK PAD W/HEAD ARE REST (MISCELLANEOUS) ×4
KIT PINK PAD W/HEAD ARM REST (MISCELLANEOUS) ×2 IMPLANT
LABEL OR SOLS (LABEL) ×4 IMPLANT
MANIFOLD NEPTUNE II (INSTRUMENTS) ×4 IMPLANT
MESH VENT LT ST 11.4CM CRL (Mesh General) ×2 IMPLANT
NDL INSUFFLATION 14GA 120MM (NEEDLE) ×2 IMPLANT
NEEDLE HYPO 22GX1.5 SAFETY (NEEDLE) ×4 IMPLANT
NEEDLE INSUFFLATION 14GA 120MM (NEEDLE) ×4 IMPLANT
OBTURATOR OPTICAL STANDARD 8MM (TROCAR) ×2
OBTURATOR OPTICAL STND 8 DVNC (TROCAR) ×2
OBTURATOR OPTICALSTD 8 DVNC (TROCAR) ×2 IMPLANT
PACK LAP CHOLECYSTECTOMY (MISCELLANEOUS) ×4 IMPLANT
PENCIL ELECTRO HAND CTR (MISCELLANEOUS) ×4 IMPLANT
SEAL CANN UNIV 5-8 DVNC XI (MISCELLANEOUS) ×4 IMPLANT
SEAL XI 5MM-8MM UNIVERSAL (MISCELLANEOUS) ×4
SET TUBE SMOKE EVAC HIGH FLOW (TUBING) ×4 IMPLANT
SOLUTION ELECTROLUBE (MISCELLANEOUS) ×4 IMPLANT
SPONGE T-LAP 18X18 ~~LOC~~+RFID (SPONGE) ×4 IMPLANT
STAPLER CANNULA SEAL DVNC XI (STAPLE) ×2 IMPLANT
STAPLER CANNULA SEAL XI (STAPLE) ×2
SUT MNCRL 4-0 (SUTURE) ×8
SUT MNCRL 4-0 27XMFL (SUTURE) ×4
SUT STRATAFIX PDS 30 CT-1 (SUTURE) ×4 IMPLANT
SUT VIC AB 3-0 SH 27 (SUTURE) ×4
SUT VIC AB 3-0 SH 27X BRD (SUTURE) IMPLANT
SUT VICRYL 0 AB UR-6 (SUTURE) ×8 IMPLANT
SUT VLOC 90 2/L VL 12 GS22 (SUTURE) ×8 IMPLANT
SUTURE MNCRL 4-0 27XMF (SUTURE) ×2 IMPLANT
TRAY FOLEY SLVR 16FR LF STAT (SET/KITS/TRAYS/PACK) ×2 IMPLANT
WAND RF SURG SPNG DETECT SYS (INSTRUMENTS) ×4 IMPLANT
WATER STERILE IRR 500ML POUR (IV SOLUTION) ×2 IMPLANT

## 2021-10-17 NOTE — Anesthesia Postprocedure Evaluation (Signed)
Anesthesia Post Note  Patient: Alex Wilkins.  Procedure(s) Performed: XI ROBOTIC ASSISTED VENTRAL HERNIA, incisional incarcerated  Patient location during evaluation: PACU Anesthesia Type: General Level of consciousness: awake and alert, oriented and patient cooperative Pain management: pain level controlled Vital Signs Assessment: post-procedure vital signs reviewed and stable Respiratory status: spontaneous breathing, nonlabored ventilation and respiratory function stable Cardiovascular status: blood pressure returned to baseline and stable Postop Assessment: adequate PO intake Anesthetic complications: no   No notable events documented.   Last Vitals:  Vitals:   10/17/21 1230 10/17/21 1236  BP:  111/72  Pulse: 83 83  Resp: 18 18  Temp:  (!) 36.4 C  SpO2: 97% 99%    Last Pain:  Vitals:   10/17/21 1236  TempSrc: Temporal  PainSc: 7                  Reed Breech

## 2021-10-17 NOTE — Transfer of Care (Signed)
Immediate Anesthesia Transfer of Care Note  Patient: Alex Wilkins.  Procedure(s) Performed: XI ROBOTIC ASSISTED VENTRAL HERNIA, incisional incarcerated  Patient Location: PACU  Anesthesia Type:General  Level of Consciousness: sedated  Airway & Oxygen Therapy: Patient Spontanous Breathing and Patient connected to face mask oxygen  Post-op Assessment: Report given to RN and Post -op Vital signs reviewed and stable  Post vital signs: Reviewed and stable  Last Vitals:  Vitals Value Taken Time  BP 107/80 10/17/21 1135  Temp 36.9 C 10/17/21 1135  Pulse 81 10/17/21 1142  Resp 16 10/17/21 1142  SpO2 99 % 10/17/21 1142    Last Pain:  Vitals:   10/17/21 0822  TempSrc: Oral  PainSc: 0-No pain         Complications: No notable events documented.

## 2021-10-17 NOTE — Interval H&P Note (Signed)
History and Physical Interval Note:  10/17/2021 8:46 AM  Alex Wilkins.  has presented today for surgery, with the diagnosis of incisional hernia, incarcerated.  The various methods of treatment have been discussed with the patient and family. After consideration of risks, benefits and other options for treatment, the patient has consented to  Procedure(s): XI ROBOTIC ASSISTED VENTRAL HERNIA, incisional incarcerated (N/A) as a surgical intervention.  The patient's history has been reviewed, patient examined, no change in status, stable for surgery.  I have reviewed the patient's chart and labs.  Questions were answered to the patient's satisfaction.     [name]

## 2021-10-17 NOTE — Anesthesia Preprocedure Evaluation (Addendum)
Anesthesia Evaluation  Patient identified by MRN, date of birth, ID band Patient awake    Reviewed: Allergy & Precautions, NPO status , Patient's Chart, lab work & pertinent test results  History of Anesthesia Complications Negative for: history of anesthetic complications  Airway Mallampati: I   Neck ROM: Full    Dental   Missing few molars:   Pulmonary Current Smoker (1/2 ppd) and Patient abstained from smoking.,    Pulmonary exam normal breath sounds clear to auscultation       Cardiovascular Exercise Tolerance: Good hypertension, Normal cardiovascular exam Rhythm:Regular Rate:Normal     Neuro/Psych Hx cocaine abuse, none in several years per pt    GI/Hepatic GERD  ,  Endo/Other  negative endocrine ROS  Renal/GU negative Renal ROS     Musculoskeletal   Abdominal   Peds  Hematology negative hematology ROS (+)   Anesthesia Other Findings   Reproductive/Obstetrics                           Anesthesia Physical Anesthesia Plan  ASA: 2  Anesthesia Plan: General   Post-op Pain Management:    Induction: Intravenous  PONV Risk Score and Plan: 1 and Ondansetron, Dexamethasone and Treatment may vary due to age or medical condition  Airway Management Planned: Oral ETT  Additional Equipment:   Intra-op Plan:   Post-operative Plan: Extubation in OR  Informed Consent: I have reviewed the patients History and Physical, chart, labs and discussed the procedure including the risks, benefits and alternatives for the proposed anesthesia with the patient or authorized representative who has indicated his/her understanding and acceptance.     Dental advisory given  Plan Discussed with: CRNA  Anesthesia Plan Comments: (Patient refuses all blood products, including albumin, even if needed to save life.  Patient consented for risks of anesthesia including but not limited to:  - adverse  reactions to medications - damage to eyes, teeth, lips or other oral mucosa - nerve damage due to positioning  - sore throat or hoarseness - damage to heart, brain, nerves, lungs, other parts of body or loss of life  Informed patient about role of CRNA in peri- and intra-operative care.  Patient voiced understanding.)       Anesthesia Quick Evaluation

## 2021-10-17 NOTE — Op Note (Signed)
°  Procedure Date:  10/17/2021  Pre-operative Diagnosis:  Incarcerated incisional hernia  Post-operative Diagnosis: Incarcerated incisional hernia 1.7 x 1.5 cm.  Procedure:  Robotic assisted Incarcerated incisional Hernia Repair with mesh  Surgeon:  [name], MD  Assistant:  Karl Bales, PA-S  Anesthesia:  General endotracheal  Estimated Blood Loss:  10 ml  Specimens:  None  Complications:  None  Indications for Procedure:  This is a 41 y.o. male who presents with an incarcerated incisional hernia.  The options of surgery versus observation were reviewed with the patient and/or family. The risks of bleeding, abscess or infection, recurrence of symptoms, potential for an open procedure, injury to surrounding structures, and chronic pain were all discussed with the patient and was willing to proceed.  Description of Procedure: The patient was correctly identified in the preoperative area and brought into the operating room.  The patient was placed supine with VTE prophylaxis in place.  Appropriate time-outs were performed.  Anesthesia was induced and the patient was intubated.  Appropriate antibiotics were infused.  The abdomen was prepped and draped in a sterile fashion. The patient's hernia defect was marked with a marking pen.  A Veress needle was introduced in the left upper quadrant and pneumoperitoneum was obtained with appropriate pressures.  Using Optiview technique, an 8 mm port was introduced in the left lateral abdominal wall without complications.  Then, a 12 mm port was introduced in the left upper quadrant and an 8 mm port in the left lower quadrant under direct visualization.  A 4.5 inch Bard Ventralight ST Echo mesh, a ruler, a 0 Stratafix suture, and two 2-0 V-loc sutures were inserted through the 12 mm port under direct visualization.  The DaVinci platform was docked, camera targeted, and instruments placed under direct visualization.  The patient's hernia was  fully reduced and the peritoneum and preperitoneal fat were dissected and resected to allow better exposure of the hernia defect and for better mesh placement.  The hernia defect was measured to be 1.7 x 1.5 cm.  The hernia defect was closed using the stratafix suture.  A PMI was brought through the center of the hernia defect and the positioning system of the mesh was passed through and insufflated.  This allowed the mesh to splay open and be centered over the repair site with good overlap all around.  The mesh was then sutured in place circumferentially and through the center of the mesh using the V-loc sutures.  All needles, ruler, and the positioning system were then removed through the 12 mm port without complications.  The preperitoneal fat was placed in an Endocatch bag through the 12 mm port.  The DaVinci platform was then undocked and instruments removed.    50 ml of Exparel solution mixed with 0.5% bupivacaine with epi was infiltrated around the mesh edges, hernia repair site, and port sites.  The 12 mm port was removed, and the endocatch bag removed as well.  The fascia was closed under direct visualization utilizing an Endo Close technique with 0 Vicryl suture.  The 8 mm ports were removed. All incisions were closed using 3-0 Vicryl and 4-0 Monocryl.  The wounds were cleaned and sealed with DermaBond.  The patient was emerged from anesthesia and extubated and brought to the recovery room for further management.  The patient tolerated the procedure well and all counts were correct at the end of the case.   [name], MD

## 2021-10-17 NOTE — Anesthesia Procedure Notes (Signed)
Procedure Name: Intubation Date/Time: 10/17/2021 9:13 AM Performed by: Loletha Grayer, CRNA Pre-anesthesia Checklist: Patient identified, Patient being monitored, Timeout performed, Emergency Drugs available and Suction available Patient Re-evaluated:Patient Re-evaluated prior to induction Oxygen Delivery Method: Circle system utilized Preoxygenation: Pre-oxygenation with 100% oxygen Induction Type: IV induction Ventilation: Mask ventilation without difficulty Laryngoscope Size: Mac and 3 Grade View: Grade II Tube type: Oral Tube size: 7.5 mm Number of attempts: 1 Airway Equipment and Method: Stylet Placement Confirmation: ETT inserted through vocal cords under direct vision, positive ETCO2 and breath sounds checked- equal and bilateral Secured at: 22 cm Tube secured with: Tape Dental Injury: Teeth and Oropharynx as per pre-operative assessment

## 2021-10-17 NOTE — Discharge Instructions (Signed)

## 2021-10-18 ENCOUNTER — Encounter: Payer: Self-pay | Admitting: Surgery

## 2021-10-18 ENCOUNTER — Other Ambulatory Visit: Payer: Self-pay | Admitting: Surgery

## 2021-10-18 ENCOUNTER — Telehealth: Payer: Self-pay | Admitting: *Deleted

## 2021-10-18 MED ORDER — OXYCODONE-ACETAMINOPHEN 5-325 MG PO TABS: 1.0000 | ORAL_TABLET | ORAL | 0 refills | Status: AC | PRN

## 2021-10-18 NOTE — Telephone Encounter (Signed)
Patient called and had surgery yesterday by Dr.Piscoya xi robotic assisted ventral hernia and he was given oxycodone which is making him nausea. He has taken percocet before and has done better on it. He would like to see if he can get a prescription for percocet.   Pharmacy is [place]

## 2021-10-19 NOTE — Telephone Encounter (Signed)
Patient notified RX has been sent to pharmacy.

## 2021-11-01 ENCOUNTER — Other Ambulatory Visit: Payer: Self-pay

## 2021-11-01 ENCOUNTER — Encounter: Payer: Self-pay | Admitting: Surgery

## 2021-11-01 ENCOUNTER — Ambulatory Visit (INDEPENDENT_AMBULATORY_CARE_PROVIDER_SITE_OTHER): Payer: Medicaid Other | Admitting: Surgery

## 2021-11-01 VITALS — BP 117/81 | HR 106 | Temp 98.1°F | Ht 73.0 in | Wt 180.4 lb

## 2021-11-01 DIAGNOSIS — Z09 Encounter for follow-up examination after completed treatment for conditions other than malignant neoplasm: Secondary | ICD-10-CM | POA: Diagnosis not present

## 2021-11-01 DIAGNOSIS — K430 Incisional hernia with obstruction, without gangrene: Secondary | ICD-10-CM | POA: Diagnosis not present

## 2021-11-01 NOTE — Patient Instructions (Addendum)
You may rotate your Tylenol with your Ibuprofen taking one dose every 4 hours. You may use ice to the area or heat instead if that helps more. You may take the Ibuprofen with food.   Follow-up with our office as needed.  Please call and ask to speak with a nurse if you develop questions or concerns.   GENERAL POST-OPERATIVE PATIENT INSTRUCTIONS   WOUND CARE INSTRUCTIONS: Try to keep the wound dry and avoid ointments on the wound unless directed to do so.  If the wound becomes bright red and painful or starts to drain infected material that is not clear, please contact your physician immediately.  If the wound is mildly pink and has a thick firm ridge underneath it, this is normal, and is referred to as a healing ridge.  This will resolve over the next 4-6 weeks.  BATHING: You may shower if you have been informed of this by your surgeon. However, Please do not submerge in a tub, hot tub, or pool until incisions are completely sealed or have been told by your surgeon that you may do so.  DIET:  You may eat any foods that you can tolerate.  It is a good idea to eat a high fiber diet and take in plenty of fluids to prevent constipation.  If you do become constipated you may want to take a mild laxative or take ducolax tablets on a daily basis until your bowel habits are regular.  Constipation can be very uncomfortable, along with straining, after recent surgery.  ACTIVITY: You are encouraged to walk and engage in light activity for the next two weeks.  You should not lift more than 20 pounds for 6 weeks after surgery as it could put you at increased risk for complications. Twenty pounds is roughly equivalent to a plastic bag of groceries. At that time- Listen to your body when lifting, if you have pain when lifting, stop and then try again in a few days. Soreness after doing exercises or activities of daily living is normal as you get back in to your normal routine.  MEDICATIONS:  Try to take narcotic  medications and anti-inflammatory medications, such as tylenol, ibuprofen, naprosyn, etc., with food.  This will minimize stomach upset from the medication.  Should you develop nausea and vomiting from the pain medication, or develop a rash, please discontinue the medication and contact your physician.  You should not drive, make important decisions, or operate machinery when taking narcotic pain medication.  SUNBLOCK Use sun block to incision area over the next year if this area will be exposed to sun. This helps decrease scarring and will allow you avoid a permanent darkened area over your incision.  QUESTIONS:  Please feel free to call our office if you have any questions, and we will be glad to assist you.

## 2021-11-01 NOTE — Progress Notes (Signed)
11/01/2021  History of Present Illness: Alex Wilkins. is a 41 y.o. male s/p robotic assisted incarcerated incisional hernia repair on 10/17/21.  He presents today for follow up.  Reports that he's still having soreness/discomfort at the umbilicus and LUQ incisions.  Denies any nausea, vomiting, fevers, chills, or sensation of bulging at the umbilicus.  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   Pain following surgery or procedure    Status post laparoscopic appendectomy 08/05/2016     Past Surgical History: Past Surgical History:  Procedure Laterality Date   INCISION AND DRAINAGE ABSCESS Right 05/15/2017   Procedure: INCISION AND DRAINAGE ABSCESS-RIGHT THIGH;  Surgeon: Leafy Ro, MD;  Location: [facility];  Service: General;  Laterality: Right;   INSERTION OF MESH  10/17/2021   Procedure: INSERTION OF MESH;  Surgeon: [name], MD;  Location: [facility];  Service: General;;   LAPAROSCOPIC APPENDECTOMY N/A 07/19/2016   Procedure: APPENDECTOMY LAPAROSCOPIC;  Surgeon: [name], MD;  Location: [facility];  Service: General;  Laterality: N/A;   SHOULDER ARTHROSCOPY WITH OPEN ROTATOR CUFF REPAIR Left 06/19/2017   XI ROBOTIC ASSISTED VENTRAL HERNIA N/A 10/17/2021   Procedure: XI ROBOTIC ASSISTED VENTRAL HERNIA, incisional incarcerated;  Surgeon: [name], MD;  Location: [facility] OR[facility];  Service: General;  Laterality: N/A;    Home Medications: Prior to Admission medications   Medication Sig Start Date End Date Taking? Authorizing Provider  acetaminophen (TYLENOL) 500 MG tablet Take 2 tablets (1,000 mg total) by mouth every 6 (six) hours as needed for mild pain. 10/17/21  Yes Curby Carswell, Elita Quick, MD  gabapentin (NEURONTIN) 100 MG capsule Take 2 capsules (200 mg total) by mouth 3 (three) times daily. 10/17/21 11/16/21  Yes Keyla Milone, Elita Quick, MD  ibuprofen (ADVIL) 800 MG tablet Take 1 tablet (800 mg total) by mouth every 8 (eight) hours as needed for moderate pain. 10/17/21  Yes Chandlor Noecker, Elita Quick, MD  oxyCODONE-acetaminophen (PERCOCET/ROXICET) 5-325 MG tablet Take 1 tablet by mouth every 4 (four) hours as needed for severe pain. 10/18/21  Yes [name], MD    Allergies: Allergies  Allergen Reactions   Penicillins Hives    Has patient had a PCN reaction causing immediate rash, facial/tongue/throat swelling, SOB or lightheadedness with hypotension:no Has patient had a PCN reaction causing severe rash involving mucus membranes or skin necrosis: No Has patient had a PCN reaction that required hospitalization No Has patient had a PCN reaction occurring within the last 10 years: No If all of the above answers are "NO", then may proceed with Cephalosporin use.    Hydrocodone Hives and Rash    Review of Systems: Review of Systems  Constitutional:  Negative for chills and fever.  Respiratory:  Negative for shortness of breath.   Cardiovascular:  Negative for chest pain.  Gastrointestinal:  Positive for abdominal pain. Negative for nausea and vomiting.   Physical Exam BP 117/81    Pulse (!) 106    Temp 98.1 F (36.7 C)    Ht 6\' 1"  (1.854 m)    Wt 180 lb 6.4 oz (81.8 kg)    SpO2 97%    BMI 23.80 kg/m  CONSTITUTIONAL: No acute distress, well nourished. HEENT:  Normocephalic, atraumatic, extraocular motion intact. RESPIRATORY:  Normal respiratory effort without pathologic use of accessory muscles. CARDIOVASCULAR: Regular rhythm and rate. GI: The abdomen is soft, non-distended,  with some soreness to palpation at the umbilicus and the LUQ incision.  Incisions are all healing well, with no evidence of infection or breakdown.  Dermabond is starting to peel off.  No evidence of hernia recurrence.  NEUROLOGIC:  Motor and sensation is grossly normal.  Cranial nerves are grossly intact. PSYCH:  Alert and oriented to person,  place and time. Affect is normal.   Assessment and Plan: This is a 41 y.o. male s/p robotic assisted incarcerated incisional hernia repair.  --Patient is healing well, without complications.  Discussed that the umbilical area and LUQ area have the deeper sutures to close the hernia and port sites, and they will be more sore compared to the rest.  This will improve on its own though, as the healing process continues. --May take Ibuprofen or Tylenol for pain control. --Follow up as needed.  I spent 15 minutes dedicated to the care of this patient on the date of this encounter to include pre-visit review of records, face-to-face time with the patient discussing diagnosis and management, and any post-visit coordination of care.   [name], MD [facility]

## 2022-01-30 ENCOUNTER — Other Ambulatory Visit: Payer: Self-pay

## 2022-01-30 ENCOUNTER — Emergency Department: Payer: Medicaid Other

## 2022-01-30 DIAGNOSIS — Z5321 Procedure and treatment not carried out due to patient leaving prior to being seen by health care provider: Secondary | ICD-10-CM | POA: Insufficient documentation

## 2022-01-30 DIAGNOSIS — R079 Chest pain, unspecified: Secondary | ICD-10-CM | POA: Insufficient documentation

## 2022-01-30 LAB — BASIC METABOLIC PANEL
Anion gap: 6 (ref 5–15)
BUN: 12 mg/dL (ref 6–20)
CO2: 29 mmol/L (ref 22–32)
Calcium: 9 mg/dL (ref 8.9–10.3)
Chloride: 103 mmol/L (ref 98–111)
Creatinine, Ser: 1.05 mg/dL (ref 0.61–1.24)
GFR, Estimated: >60 mL/min (ref >60)
Glucose, Bld: 76 mg/dL (ref 70–99)
Potassium: 3.9 mmol/L (ref 3.5–5.1)
Sodium: 138 mmol/L (ref 135–145)

## 2022-01-30 LAB — CBC
HCT: 46.4 % (ref 39.0–52.0)
Hemoglobin: 14.9 g/dL (ref 13.0–17.0)
MCH: 30.7 pg (ref 26.0–34.0)
MCHC: 32.1 g/dL (ref 30.0–36.0)
MCV: 95.5 fL (ref 80.0–100.0)
Platelets: 302 10*3/uL (ref 150–400)
RBC: 4.86 MIL/uL (ref 4.22–5.81)
RDW: 12 % (ref 11.5–15.5)
WBC: 6 10*3/uL (ref 4.0–10.5)
nRBC: 0 % (ref 0.0–0.2)

## 2022-01-30 LAB — TROPONIN I (HIGH SENSITIVITY): Troponin I (High Sensitivity): <2 ng/L (ref <18)

## 2022-01-30 NOTE — ED Triage Notes (Signed)
Ambulatory to triage with chest pain x 1 month. Reports sharp pain, worsens with stress, is sometimes constant, and at other times is intermittent. Denies SOB ?

## 2022-01-31 ENCOUNTER — Emergency Department
Admission: EM | Admit: 2022-01-31 | Discharge: 2022-01-31 | Disposition: A | Discharge status: Home / Self Care | Payer: Medicaid Other | Attending: Emergency Medicine | Admitting: Emergency Medicine

## 2022-01-31 NOTE — ED Notes (Signed)
No answer when called several times from lobby 

## 2022-09-11 ENCOUNTER — Encounter: Payer: Self-pay | Admitting: *Deleted

## 2022-09-11 ENCOUNTER — Emergency Department: Payer: Medicaid Other

## 2022-09-11 ENCOUNTER — Other Ambulatory Visit: Payer: Self-pay

## 2022-09-11 ENCOUNTER — Emergency Department
Admission: EM | Admit: 2022-09-11 | Discharge: 2022-09-11 | Disposition: A | Discharge status: Home / Self Care | Payer: Medicaid Other | Attending: Emergency Medicine | Admitting: Emergency Medicine

## 2022-09-11 DIAGNOSIS — R0789 Other chest pain: Secondary | ICD-10-CM | POA: Diagnosis not present

## 2022-09-11 DIAGNOSIS — R590 Localized enlarged lymph nodes: Secondary | ICD-10-CM | POA: Diagnosis not present

## 2022-09-11 DIAGNOSIS — M25512 Pain in left shoulder: Secondary | ICD-10-CM | POA: Diagnosis not present

## 2022-09-11 DIAGNOSIS — M5412 Radiculopathy, cervical region: Secondary | ICD-10-CM

## 2022-09-11 DIAGNOSIS — R079 Chest pain, unspecified: Secondary | ICD-10-CM | POA: Diagnosis present

## 2022-09-11 LAB — BASIC METABOLIC PANEL
Anion gap: 8 (ref 5–15)
BUN: 13 mg/dL (ref 6–20)
CO2: 26 mmol/L (ref 22–32)
Calcium: 8.6 mg/dL — ABNORMAL LOW (ref 8.9–10.3)
Chloride: 106 mmol/L (ref 98–111)
Creatinine, Ser: 1.05 mg/dL (ref 0.61–1.24)
GFR, Estimated: >60 mL/min (ref >60)
Glucose, Bld: 109 mg/dL — ABNORMAL HIGH (ref 70–99)
Potassium: 3.5 mmol/L (ref 3.5–5.1)
Sodium: 140 mmol/L (ref 135–145)

## 2022-09-11 LAB — CBC
HCT: 45 % (ref 39.0–52.0)
Hemoglobin: 14.6 g/dL (ref 13.0–17.0)
MCH: 31.1 pg (ref 26.0–34.0)
MCHC: 32.4 g/dL (ref 30.0–36.0)
MCV: 95.9 fL (ref 80.0–100.0)
Platelets: 232 10*3/uL (ref 150–400)
RBC: 4.69 MIL/uL (ref 4.22–5.81)
RDW: 11.8 % (ref 11.5–15.5)
WBC: 4.6 10*3/uL (ref 4.0–10.5)
nRBC: 0 % (ref 0.0–0.2)

## 2022-09-11 LAB — TROPONIN I (HIGH SENSITIVITY): Troponin I (High Sensitivity): 3 ng/L (ref <18)

## 2022-09-11 MED ORDER — PREDNISONE 20 MG PO TABS
60.0000 mg | ORAL_TABLET | Freq: Once | ORAL | Status: AC
  Administered 2022-09-11: 60 mg via ORAL
  Filled 2022-09-11: qty 3

## 2022-09-11 MED ORDER — CYCLOBENZAPRINE HCL 5 MG PO TABS
5.0000 mg | ORAL_TABLET | Freq: Three times a day (TID) | ORAL | 0 refills | Status: DC | PRN
  Filled 2022-09-11: qty 20, 4d supply, fill #0

## 2022-09-11 MED ORDER — PREDNISONE 10 MG (21) PO TBPK
10.0000 mg | ORAL_TABLET | Freq: Every day | ORAL | 0 refills | Status: DC
  Filled 2022-09-11: qty 21, 21d supply, fill #0

## 2022-09-11 NOTE — ED Notes (Signed)
Pt Dc to home. Dc instructions reviewed with all questions answered. Pt verbalizes understanding. Pt ambulatory out of dept with steady gait 

## 2022-09-11 NOTE — ED Provider Notes (Signed)
[facility] EMERGENCY DEPARTMENT Provider Note   CSN: [id number] Arrival date & time: 09/11/22  1902     History  Chief Complaint  Patient presents with   Chest Pain    Alex Wilkins. is a 41 y.o. male.  Presents to the emergency department for evaluation of left-sided chest pain.  Symptoms present for 3 days but have been ongoing for much longer, 4 to 5 years.  He describes numbness: Along the left side of his neck left arm into the thumb and index finger.  He states his chest pain is along the left anterior shoulder.  He has pain with attempting to abduct his left shoulder.  He denies any back pain, fevers, shortness of breath.  His chest pain is described as numbness and tingling with no pressure, nausea or vomiting.  He denies any radiation of pain into his jaw.  He states his left anterior shoulder/chest is tender to touch patient denies any headaches, vision changes, dizziness, lightheadedness  HPI     Home Medications Prior to Admission medications   Medication Sig Start Date End Date Taking? Authorizing Provider  cyclobenzaprine (FLEXERIL) 5 MG tablet Take 1-2 tablets (5-10 mg total) by mouth 3 (three) times daily as needed for muscle spasms. 09/11/22  Yes Evon Slack, PA-C  predniSONE (DELTASONE) 10 MG tablet Take 1 tablet (10 mg total) by mouth daily. 6,5,4,3,2,1 six day taper 09/11/22  Yes Evon Slack, PA-C  acetaminophen (TYLENOL) 500 MG tablet Take 2 tablets (1,000 mg total) by mouth every 6 (six) hours as needed for mild pain. 10/17/21   [name], MD  gabapentin (NEURONTIN) 100 MG capsule Take 2 capsules (200 mg total) by mouth 3 (three) times daily. 10/17/21 11/16/21  [name], MD  ibuprofen (ADVIL) 800 MG tablet Take 1 tablet (800 mg total) by mouth every 8 (eight) hours as needed for moderate pain. 10/17/21   [name], MD  oxyCODONE-acetaminophen (PERCOCET/ROXICET) 5-325 MG tablet Take 1 tablet by mouth every 4 (four)  hours as needed for severe pain. 10/18/21   [name], MD      Allergies    Penicillins and Hydrocodone    Review of Systems   Review of Systems  Physical Exam Updated Vital Signs BP (!) 142/76 (BP Location: Right Arm)   Pulse 95   Temp 98.5 F (36.9 C) (Oral)   Resp 16   Ht 6\' 1"  (1.854 m)   Wt 88.9 kg   SpO2 96%   BMI 25.86 kg/m  Physical Exam Vitals reviewed.  Constitutional:      Appearance: He is well-developed.  HENT:     Head: Normocephalic and atraumatic.  Eyes:     Conjunctiva/sclera: Conjunctivae normal.  Cardiovascular:     Rate and Rhythm: Normal rate.     Pulses: Normal pulses.     Heart sounds: Normal heart sounds.  Pulmonary:     Effort: Pulmonary effort is normal. No respiratory distress.  Chest:     Chest wall: No tenderness.  Abdominal:     General: There is no distension.     Tenderness: There is no abdominal tenderness. There is no guarding.  Musculoskeletal:        General: Normal range of motion.     Cervical back: Normal range of motion.     Comments: No chest wall tenderness to palpation.  Left anterior shoulder tender to palpation.  Left anterior shoulder incision site healed, no swelling warmth redness or drainage.  Positive Hawkins and impingement test.  No weakness with bicep tricep or grip strength but does have some weakness with abduction.  Grip strength normal.  2+ radial pulse.  Skin:    General: Skin is warm.     Findings: No rash.  Neurological:     General: No focal deficit present.     Mental Status: He is alert and oriented to person, place, and time.     Cranial Nerves: No cranial nerve deficit.     Motor: No weakness.     Gait: Gait normal.  Psychiatric:        Mood and Affect: Mood normal.        Behavior: Behavior normal.        Thought Content: Thought content normal.     ED Results / Procedures / Treatments   Labs (all labs ordered are listed, but only abnormal results are displayed) Labs Reviewed  BASIC  METABOLIC PANEL - Abnormal; Notable for the following components:      Result Value   Glucose, Bld 109 (*)    Calcium 8.6 (*)    All other components within normal limits  CBC  TROPONIN I (HIGH SENSITIVITY)    EKG None  Radiology DG Chest 2 View  Result Date: 09/11/2022 CLINICAL DATA:  Chest pain EXAM: CHEST - 2 VIEW COMPARISON:  01/30/2022 FINDINGS: The heart size and mediastinal contours are within normal limits. Both lungs are clear. The visualized skeletal structures are unremarkable. IMPRESSION: No active cardiopulmonary disease. Electronically Signed   By: Donavan Foil M.D.   On: 09/11/2022 19:49    Procedures Procedures    Medications Ordered in ED Medications  predniSONE (DELTASONE) tablet 60 mg (60 mg Oral Given 09/11/22 2118)    ED Course/ Medical Decision Making/ A&P                           Medical Decision Making Amount and/or Complexity of Data Reviewed Labs: ordered. Radiology: ordered.  Risk Prescription drug management.   41 year old male with left-sided chest pain he describes as numbness going down his left shoulder, left bicep, radial aspect of the forearm and into his left thumb.  He has no headaches, fevers, neurological deficits.  Positive Hawkins and impingement test on exam.  Patient with normal chest x-ray, normal blood work with cardiac enzymes.  EKG within normal limits.  Patient treated for cervical radiculopathy with prednisone taper and muscle relaxer.  Recommend he follow-up with orthopedics/PCP.  Patient understands signs symptoms return to the ER for. Final Clinical Impression(s) / ED Diagnoses Final diagnoses:  Atypical chest pain  Left cervical radiculopathy    Rx / DC Orders ED Discharge Orders          Ordered    predniSONE (DELTASONE) 10 MG tablet  Daily        09/11/22 2123    cyclobenzaprine (FLEXERIL) 5 MG tablet  3 times daily PRN        09/11/22 2123              Renata Caprice 09/11/22 2127     Duffy Bruce, MD 09/11/22 2315

## 2022-09-11 NOTE — ED Triage Notes (Signed)
Pt reports chest pain for 3 days.  Pt reports pain radiates into back.  No sob.  Cig smoker.   No cough .  No fever.  Pt alert speech clear.

## 2022-09-11 NOTE — Discharge Instructions (Signed)
Please take medications as prescribed.  Return to the ER for any worsening symptoms or any urgent changes in your health.

## 2022-09-12 ENCOUNTER — Other Ambulatory Visit (HOSPITAL_COMMUNITY): Payer: Self-pay

## 2022-09-12 ENCOUNTER — Telehealth: Payer: Self-pay | Admitting: Orthopedic Surgery

## 2022-09-12 MED ORDER — CYCLOBENZAPRINE HCL 5 MG PO TABS: 5.0000 mg | ORAL_TABLET | Freq: Three times a day (TID) | ORAL | 0 refills | Status: DC | PRN

## 2022-09-12 MED ORDER — PREDNISONE 10 MG PO TABS: 10.0000 mg | ORAL_TABLET | Freq: Every day | ORAL | 0 refills | Status: DC

## 2022-09-12 MED ORDER — PREDNISONE 10 MG PO TABS: 10.0000 mg | ORAL_TABLET | ORAL | 0 refills | Status: AC

## 2022-09-12 MED ORDER — CYCLOBENZAPRINE HCL 10 MG PO TABS: 10.0000 mg | ORAL_TABLET | Freq: Three times a day (TID) | ORAL | 0 refills | Status: DC | PRN

## 2022-09-12 NOTE — Telephone Encounter (Cosign Needed)
Needs prescriptions sent to new pharmacy

## 2022-12-19 ENCOUNTER — Other Ambulatory Visit: Payer: Self-pay

## 2023-04-11 ENCOUNTER — Emergency Department
Admission: EM | Admit: 2023-04-11 | Discharge: 2023-04-11 | Discharge status: Against Medical Advice | Payer: Medicaid Other | Attending: Emergency Medicine | Admitting: Emergency Medicine

## 2023-04-11 ENCOUNTER — Other Ambulatory Visit: Payer: Self-pay

## 2023-04-11 DIAGNOSIS — R2232 Localized swelling, mass and lump, left upper limb: Secondary | ICD-10-CM | POA: Diagnosis not present

## 2023-04-11 DIAGNOSIS — Z5321 Procedure and treatment not carried out due to patient leaving prior to being seen by health care provider: Secondary | ICD-10-CM | POA: Diagnosis not present

## 2023-04-11 NOTE — ED Triage Notes (Signed)
Pt to ED for knot to left shoulder for 3-4 years since rotator cuff surgery. Reports knot worsening.

## 2023-04-11 NOTE — ED Notes (Signed)
No answer

## 2023-06-05 ENCOUNTER — Other Ambulatory Visit: Payer: Self-pay

## 2023-06-05 ENCOUNTER — Encounter: Payer: Self-pay | Admitting: Emergency Medicine

## 2023-06-05 ENCOUNTER — Emergency Department
Admission: EM | Admit: 2023-06-05 | Discharge: 2023-06-05 | Disposition: A | Discharge status: Home / Self Care | Payer: Medicaid Other | Attending: Emergency Medicine | Admitting: Emergency Medicine

## 2023-06-05 ENCOUNTER — Emergency Department: Payer: Medicaid Other

## 2023-06-05 DIAGNOSIS — R21 Rash and other nonspecific skin eruption: Secondary | ICD-10-CM | POA: Diagnosis not present

## 2023-06-05 DIAGNOSIS — W57XXXA Bitten or stung by nonvenomous insect and other nonvenomous arthropods, initial encounter: Secondary | ICD-10-CM | POA: Insufficient documentation

## 2023-06-05 DIAGNOSIS — S90861A Insect bite (nonvenomous), right foot, initial encounter: Secondary | ICD-10-CM | POA: Diagnosis not present

## 2023-06-05 DIAGNOSIS — M79671 Pain in right foot: Secondary | ICD-10-CM | POA: Diagnosis not present

## 2023-06-05 DIAGNOSIS — S90466A Insect bite (nonvenomous), unspecified lesser toe(s), initial encounter: Secondary | ICD-10-CM

## 2023-06-05 MED ORDER — HYDROCORTISONE 1 % EX CREA: 1.0000 | TOPICAL_CREAM | Freq: Four times a day (QID) | CUTANEOUS | 0 refills | Status: AC

## 2023-06-05 MED ORDER — DOXYCYCLINE HYCLATE 100 MG PO TABS: 100.0000 mg | ORAL_TABLET | Freq: Two times a day (BID) | ORAL | 0 refills | Status: DC

## 2023-06-05 NOTE — ED Provider Notes (Signed)
[facility] Emergency Department Provider Note     Event Date/Time   First MD Initiated Contact with Patient 06/05/23 2233     (approximate)   History   Insect Bite   HPI  Alex Harben. is a 42 y.o. male presents with a spider bite in between his right 3rd and 4th toes.  Patient reports he was bit 2 days ago and he believes it was a spider but did not see a spider.  Denies fever and chills.  Patient also reports bumps on the back of his neck that are pruritic in nature.  Patient has not tried anything for pain.  No other complaints at this time.   Physical Exam   Triage Vital Signs: ED Triage Vitals [06/05/23 2223]  Encounter Vitals Group     BP 131/89     Systolic BP Percentile      Diastolic BP Percentile      Pulse Rate 100     Resp 18     Temp 98.5 F (36.9 C)     Temp Source Oral     SpO2 99 %     Weight 195 lb 1.7 oz (88.5 kg)     Height      Head Circumference      Peak Flow      Pain Score 7     Pain Loc      Pain Education      Exclude from Growth Chart     Most recent vital signs: Vitals:   06/05/23 2223  BP: 131/89  Pulse: 100  Resp: 18  Temp: 98.5 F (36.9 C)  SpO2: 99%    General Well-appearing.  Awake, no distress.  HEENT NCAT. PERRL. EOMI.  CV:  Good peripheral perfusion.  RESP:  Normal effort.  ABD:  No distention.  Other:  Webspace between right third and fourth digit of foot reveals a dime size open wound.  small amount of yellow pus noted.   Exterior neck reveals scabbed rash.  Erythemic base.  Pruritic in nature.  No edema.   ED Results / Procedures / Treatments   Labs (all labs ordered are listed, but only abnormal results are displayed) Labs Reviewed - No data to display  RADIOLOGY  I personally viewed and evaluated these images as part of my medical decision making, as well as reviewing the written report by the radiologist.  ED Provider Interpretation: Right foot is unremarkable  DG  Foot Complete Right  Result Date: 06/05/2023 CLINICAL DATA:  Right foot pain, insect bite EXAM: RIGHT FOOT COMPLETE - 3+ VIEW COMPARISON:  None Available. FINDINGS: There is no evidence of fracture or dislocation. There is no evidence of arthropathy or other focal bone abnormality. Soft tissues are unremarkable. IMPRESSION: Negative. Electronically Signed   By: Helyn Numbers M.D.   On: 06/05/2023 23:18    PROCEDURES:  Critical Care performed: No  Procedures   MEDICATIONS ORDERED IN ED: Medications - No data to display   IMPRESSION / MDM / ASSESSMENT AND PLAN / ED COURSE  I reviewed the triage vital signs and the nursing notes.                               42 y.o. male presents to the emergency department for evaluation and treatment of possible insect bite to his right foot. See HPI for further details.    Differential diagnosis  includes, but is not limited to spider bite, cellulitis, osteomyelitis, rash  Patient's presentation is most consistent with acute complicated illness / injury requiring diagnostic workup.  Patient is alert and oriented.  He is hemodynamically stable and afebrile.  Foot x-ray obtained in triage and reassuring.  Presentation is not clinically consistent with cellulitis.  Given the patient was bit by a spider will be prescribed antibiotics.  In the ED wound is extensively cleaned with saline and chlorhexidine solution.  Patient is educated on keeping this area clean and dry and protecting it with a barrier.  He is advised to apply Neosporin after daily cleansing.  Patient understand.  Patient is in stable condition for discharge home with prescription for doxycycline given his allergy to penicillin.  I did not want to cross with a cephalosporin.  Hydrocortisone cream is also prescribed for rash on back of neck.  Patient is given ED precautions to return to the ED for any worsening or new symptoms. Patient verbalizes understanding. All questions and concerns were  addressed during ED visit.     FINAL CLINICAL IMPRESSION(S) / ED DIAGNOSES   Final diagnoses:  Insect bite of toe, initial encounter  Rash     Rx / DC Orders   ED Discharge Orders          Ordered    doxycycline (VIBRA-TABS) 100 MG tablet  2 times daily        06/05/23 2341    hydrocortisone cream 1 %  4 times daily        06/05/23 2341             Note:  This document was prepared using Dragon voice recognition software and may include unintentional dictation errors.    Romeo Apple, Kolson Chovanec A, PA-C 06/06/23 Marlyne Beards    Pilar Jarvis, MD 06/06/23 [id number]

## 2023-06-05 NOTE — Discharge Instructions (Addendum)
Take ibuprofen and Tylenol for pain.  Keep foot elevated.  Keep Area clean and dry.  Apply Neosporin he can purchase over-the-counter.  Follow-up with your primary care.  Monitor for symptoms including fever and chills.

## 2023-06-05 NOTE — ED Triage Notes (Signed)
Pt in with reported insect bite between webbing of R 3rd and 4th toes that he first noticed 4 days ago. Area dark and appears to have some blistering with pus drainage. Pt also states he noticed bumps coming up on the back of his neck at the same time of bite. Denies any fevers
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
## 2023-09-21 ENCOUNTER — Emergency Department
Admission: EM | Admit: 2023-09-21 | Discharge: 2023-09-21 | Disposition: A | Discharge status: Home / Self Care | Payer: 59 | Attending: Emergency Medicine | Admitting: Emergency Medicine

## 2023-09-21 ENCOUNTER — Emergency Department: Payer: 59

## 2023-09-21 ENCOUNTER — Other Ambulatory Visit: Payer: Self-pay

## 2023-09-21 DIAGNOSIS — M25512 Pain in left shoulder: Secondary | ICD-10-CM | POA: Diagnosis not present

## 2023-09-21 DIAGNOSIS — R0789 Other chest pain: Secondary | ICD-10-CM | POA: Diagnosis not present

## 2023-09-21 DIAGNOSIS — G8929 Other chronic pain: Secondary | ICD-10-CM | POA: Insufficient documentation

## 2023-09-21 LAB — BASIC METABOLIC PANEL
Anion gap: 9 (ref 5–15)
BUN: 13 mg/dL (ref 6–20)
CO2: 25 mmol/L (ref 22–32)
Calcium: 8.8 mg/dL — ABNORMAL LOW (ref 8.9–10.3)
Chloride: 98 mmol/L (ref 98–111)
Creatinine, Ser: 1 mg/dL (ref 0.61–1.24)
GFR, Estimated: >60 mL/min (ref >60)
Glucose, Bld: 144 mg/dL — ABNORMAL HIGH (ref 70–99)
Potassium: 3.3 mmol/L — ABNORMAL LOW (ref 3.5–5.1)
Sodium: 132 mmol/L — ABNORMAL LOW (ref 135–145)

## 2023-09-21 LAB — CBC
HCT: 47.2 % (ref 39.0–52.0)
Hemoglobin: 15.8 g/dL (ref 13.0–17.0)
MCH: 31.3 pg (ref 26.0–34.0)
MCHC: 33.5 g/dL (ref 30.0–36.0)
MCV: 93.7 fL (ref 80.0–100.0)
Platelets: 273 10*3/uL (ref 150–400)
RBC: 5.04 MIL/uL (ref 4.22–5.81)
RDW: 12.1 % (ref 11.5–15.5)
WBC: 6.6 10*3/uL (ref 4.0–10.5)
nRBC: 0 % (ref 0.0–0.2)

## 2023-09-21 LAB — TROPONIN I (HIGH SENSITIVITY): Troponin I (High Sensitivity): 3 ng/L (ref <18)

## 2023-09-21 MED ORDER — CYCLOBENZAPRINE HCL 5 MG PO TABS: 5.0000 mg | ORAL_TABLET | Freq: Three times a day (TID) | ORAL | 0 refills | Status: AC | PRN

## 2023-09-21 MED ORDER — CYCLOBENZAPRINE HCL 10 MG PO TABS
10.0000 mg | ORAL_TABLET | Freq: Once | ORAL | Status: DC
  Filled 2023-09-21: qty 1

## 2023-09-21 MED ORDER — MELOXICAM 15 MG PO TABS: 15.0000 mg | ORAL_TABLET | Freq: Every day | ORAL | 0 refills | Status: AC

## 2023-09-21 MED ORDER — MELOXICAM 7.5 MG PO TABS
15.0000 mg | ORAL_TABLET | Freq: Once | ORAL | Status: DC
  Filled 2023-09-21: qty 2

## 2023-09-21 NOTE — ED Notes (Signed)
ED Alex Wilkins had gathered the pt's meds and discharge papers to assist the pt. When she went into the room the pt was not there. Bathrooms were also checked and were emptied.

## 2023-09-21 NOTE — ED Notes (Signed)
This RN entered room to give pt his d/c papers along with his mobic and flexiril, ordred by provider. Pt not found in room. Waited for pt for 5 minutes, checked unit bathrooms, pt appears to have left.  Dr. Vicente Males made aware along with Gearldine Bienenstock

## 2023-09-21 NOTE — ED Provider Notes (Signed)
[facility] Provider Note   Event Date/Time   First MD Initiated Contact with Patient 09/21/23 1212     (approximate) History  Shoulder Pain and Chest Pain  HPI Javonta Wigmore. is a 42 y.o. male with a stated past medical history of rotator cuff surgery on the left who presents complaining of persistent left anterior shoulder and chest pain.  Patient states that this pain is worse whenever he is moving or using this left arm.  Patient denies any relieving factors.  Patient states that he normally feels a "knot" in this left anterior shoulder but feels that it has been getting bigger over the last week. ROS: Patient currently denies any vision changes, tinnitus, difficulty speaking, facial droop, sore throat, shortness of breath, abdominal pain, nausea/vomiting/diarrhea, dysuria, or weakness/numbness/paresthesias in any extremity   Physical Exam  Triage Vital Signs: ED Triage Vitals  Encounter Vitals Group     BP 09/21/23 0942 (!) 126/91     Systolic BP Percentile --      Diastolic BP Percentile --      Pulse Rate 09/21/23 0942 85     Resp 09/21/23 0942 18     Temp 09/21/23 0942 98.1 F (36.7 C)     Temp Source 09/21/23 0942 Oral     SpO2 09/21/23 0942 97 %     Weight --      Height --      Head Circumference --      Peak Flow --      Pain Score 09/21/23 0940 0     Pain Loc --      Pain Education --      Exclude from Growth Chart --    Most recent vital signs: Vitals:   09/21/23 0942  BP: (!) 126/91  Pulse: 85  Resp: 18  Temp: 98.1 F (36.7 C)  SpO2: 97%   General: Awake, oriented x4. CV:  Good peripheral perfusion.  Resp:  Normal effort.  Abd:  No distention.  Other:  Middle-aged well-developed, well-nourished Caucasian male resting comfortably in no acute distress.  Tenderness to palpation over the anterior shoulder with no palpable abnormalities ED Results / Procedures / Treatments  Labs (all labs ordered are listed, but only abnormal  results are displayed) Labs Reviewed  BASIC METABOLIC PANEL - Abnormal; Notable for the following components:      Result Value   Sodium 132 (*)    Potassium 3.3 (*)    Glucose, Bld 144 (*)    Calcium 8.8 (*)    All other components within normal limits  CBC  TROPONIN I (HIGH SENSITIVITY)  TROPONIN I (HIGH SENSITIVITY)   EKG ED ECG REPORT I, Merwyn Katos, the attending physician, personally viewed and interpreted this ECG. Date: 09/21/2023 EKG Time: 0941 Rate: 82 Rhythm: normal sinus rhythm QRS Axis: normal Intervals: normal ST/T Wave abnormalities: normal Narrative Interpretation: no evidence of acute ischemia RADIOLOGY ED MD interpretation: 2 view chest x-ray interpreted by me shows no evidence of acute abnormalities including no pneumonia, pneumothorax, or widened mediastinum -Agree with radiology assessment Official radiology report(s): DG Chest 2 View Result Date: 09/21/2023 CLINICAL DATA:  Left shoulder pain radiating across chest which is worse with movement EXAM: CHEST - 2 VIEW COMPARISON:  None available. FINDINGS: Cardiomediastinal silhouette and pulmonary vasculature are within normal limits. Lungs are clear. IMPRESSION: No acute cardiopulmonary process. Electronically Signed   By: Acquanetta Belling M.D.   On: 09/21/2023 10:29   PROCEDURES:  Critical Care performed: No Procedures MEDICATIONS ORDERED IN ED: Medications  meloxicam (MOBIC) tablet 15 mg (has no administration in time range)  cyclobenzaprine (FLEXERIL) tablet 10 mg (has no administration in time range)   IMPRESSION / MDM / ASSESSMENT AND PLAN / ED COURSE  I reviewed the triage vital signs and the nursing notes.                             The patient is on the cardiac monitor to evaluate for evidence of arrhythmia and/or significant heart rate changes. Patient's presentation is most consistent with acute presentation with potential threat to life or bodily function. 42 year old male with a history of  previous rotator cuff repair on the left presents for left shoulder pain Given history, exam and workup I have low suspicion for fracture, dislocation, significant ligamentous injury, septic arthritis, gout flare, new autoimmune arthropathy, or gonococcal arthropathy.  Interventions: 2 view chest x-ray shows no evidence of acute abnormalities EKG shows no evidence of ischemia or arrhythmia.  Patient's CBC, BMP, and troponin within normal limits aside from mildly low potassium at 3.3 and glucose elevation at 144 Disposition: Discharge home with strict return precautions and instructions for prompt primary care follow up in the next week.   FINAL CLINICAL IMPRESSION(S) / ED DIAGNOSES   Final diagnoses:  Chronic left shoulder pain   Rx / DC Orders   ED Discharge Orders          Ordered    meloxicam (MOBIC) 15 MG tablet  Daily        09/21/23 1250    cyclobenzaprine (FLEXERIL) 5 MG tablet  3 times daily PRN        09/21/23 1250    AMB Referral [facility] Care Management        09/21/23 1255           Note:  This document was prepared using Dragon voice recognition software and may include unintentional dictation errors.   Merwyn Katos, MD 09/21/23 1400

## 2023-09-21 NOTE — Discharge Instructions (Addendum)
You should be called by a Child psychotherapist in the next 3-5 days to discuss the options for healthcare

## 2023-09-21 NOTE — ED Notes (Signed)
Per Dr Arnoldo Morale, upgrade to [facility] 3 and order labs.
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
## 2023-10-02 ENCOUNTER — Telehealth: Payer: Self-pay | Admitting: *Deleted

## 2023-10-02 NOTE — Progress Notes (Signed)
 Transition Care Management Unsuccessful Follow-up Telephone Call  Date of discharge and from where:  [facility]  09/21/2023  Attempts:  1st Attempt  Reason for unsuccessful TCM follow-up call:  No answer/busy

## 2023-12-03 ENCOUNTER — Emergency Department

## 2023-12-03 ENCOUNTER — Other Ambulatory Visit: Payer: Self-pay

## 2023-12-03 ENCOUNTER — Emergency Department
Admission: EM | Admit: 2023-12-03 | Discharge: 2023-12-03 | Disposition: A | Discharge status: Home / Self Care | Attending: Emergency Medicine | Admitting: Emergency Medicine

## 2023-12-03 DIAGNOSIS — R059 Cough, unspecified: Secondary | ICD-10-CM | POA: Diagnosis present

## 2023-12-03 DIAGNOSIS — J168 Pneumonia due to other specified infectious organisms: Secondary | ICD-10-CM | POA: Insufficient documentation

## 2023-12-03 DIAGNOSIS — D72829 Elevated white blood cell count, unspecified: Secondary | ICD-10-CM | POA: Diagnosis not present

## 2023-12-03 DIAGNOSIS — J189 Pneumonia, unspecified organism: Secondary | ICD-10-CM

## 2023-12-03 LAB — CBC WITH DIFFERENTIAL/PLATELET
Abs Immature Granulocytes: 0.09 10*3/uL — ABNORMAL HIGH (ref 0.00–0.07)
Basophils Absolute: 0 10*3/uL (ref 0.0–0.1)
Basophils Relative: 0 %
Eosinophils Absolute: 0 10*3/uL (ref 0.0–0.5)
Eosinophils Relative: 0 %
HCT: 50.1 % (ref 39.0–52.0)
Hemoglobin: 17 g/dL (ref 13.0–17.0)
Immature Granulocytes: 1 %
Lymphocytes Relative: 2 %
Lymphs Abs: 0.3 10*3/uL — ABNORMAL LOW (ref 0.7–4.0)
MCH: 31.4 pg (ref 26.0–34.0)
MCHC: 33.9 g/dL (ref 30.0–36.0)
MCV: 92.4 fL (ref 80.0–100.0)
Monocytes Absolute: 0.5 10*3/uL (ref 0.1–1.0)
Monocytes Relative: 4 %
Neutro Abs: 12.7 10*3/uL — ABNORMAL HIGH (ref 1.7–7.7)
Neutrophils Relative %: 93 %
Platelets: 239 10*3/uL (ref 150–400)
RBC: 5.42 MIL/uL (ref 4.22–5.81)
RDW: 11.9 % (ref 11.5–15.5)
WBC: 13.7 10*3/uL — ABNORMAL HIGH (ref 4.0–10.5)
nRBC: 0 % (ref 0.0–0.2)

## 2023-12-03 LAB — COMPREHENSIVE METABOLIC PANEL
ALT: 19 U/L (ref 0–44)
AST: 21 U/L (ref 15–41)
Albumin: 4.1 g/dL (ref 3.5–5.0)
Alkaline Phosphatase: 65 U/L (ref 38–126)
Anion gap: 10 (ref 5–15)
BUN: 17 mg/dL (ref 6–20)
CO2: 23 mmol/L (ref 22–32)
Calcium: 9 mg/dL (ref 8.9–10.3)
Chloride: 101 mmol/L (ref 98–111)
Creatinine, Ser: 1.1 mg/dL (ref 0.61–1.24)
GFR, Estimated: >60 mL/min (ref >60)
Glucose, Bld: 145 mg/dL — ABNORMAL HIGH (ref 70–99)
Potassium: 4.2 mmol/L (ref 3.5–5.1)
Sodium: 134 mmol/L — ABNORMAL LOW (ref 135–145)
Total Bilirubin: 0.7 mg/dL (ref 0.0–1.2)
Total Protein: 7.5 g/dL (ref 6.5–8.1)

## 2023-12-03 LAB — TROPONIN I (HIGH SENSITIVITY): Troponin I (High Sensitivity): 3 ng/L (ref <18)

## 2023-12-03 LAB — RESP PANEL BY RT-PCR (RSV, FLU A&B, COVID)  RVPGX2
Influenza A by PCR: NEGATIVE
Influenza B by PCR: NEGATIVE
Resp Syncytial Virus by PCR: NEGATIVE
SARS Coronavirus 2 by RT PCR: NEGATIVE

## 2023-12-03 MED ORDER — DOXYCYCLINE HYCLATE 100 MG PO CAPS
100.0000 mg | ORAL_CAPSULE | Freq: Two times a day (BID) | ORAL | 0 refills | Status: AC
Start: 2023-12-03 — End: 2023-12-10

## 2023-12-03 MED ORDER — KETOROLAC TROMETHAMINE 30 MG/ML IJ SOLN
15.0000 mg | Freq: Once | INTRAMUSCULAR | Status: AC
  Administered 2023-12-03: 15 mg via INTRAVENOUS
  Filled 2023-12-03: qty 1

## 2023-12-03 MED ORDER — ONDANSETRON HCL 4 MG/2ML IJ SOLN
4.0000 mg | Freq: Once | INTRAMUSCULAR | Status: AC
  Administered 2023-12-03: 4 mg via INTRAVENOUS
  Filled 2023-12-03: qty 2

## 2023-12-03 MED ORDER — LACTATED RINGERS IV BOLUS
1000.0000 mL | Freq: Once | INTRAVENOUS | Status: AC
Start: 2023-12-03 — End: 2023-12-03
  Administered 2023-12-03: 1000 mL via INTRAVENOUS

## 2023-12-03 MED ORDER — ONDANSETRON 4 MG PO TBDP: 4.0000 mg | ORAL_TABLET | Freq: Three times a day (TID) | ORAL | 0 refills | Status: AC | PRN

## 2023-12-03 NOTE — ED Provider Notes (Signed)
 [facility] Provider Note    Event Date/Time   First MD Initiated Contact with Patient 12/03/23 0730     (approximate)   History   Chief Complaint Generalized Body Aches   HPI  Alex Wilkins. is a 43 y.o. male with past medical history of atrial fibrillation who presents to the ED complaining of generalized bodyaches.  Patient reports that he has been feeling ill for the past 24 hours with diffuse pain across much of his body including headache and soreness in his chest.  He describes dull achy pain in his chest that has been present for the past 24 hours, endorses a cough but denies any difficulty breathing.  He has had subjective fevers and chills, along with generalized weakness and malaise.  He describes feeling nauseous and has vomited a couple of times, denies any diarrhea.  He has not had any abdominal pain, flank pain, or dysuria.     Physical Exam   Triage Vital Signs: ED Triage Vitals  Encounter Vitals Group     BP 12/03/23 0716 115/89     Systolic BP Percentile --      Diastolic BP Percentile --      Pulse Rate 12/03/23 0716 99     Resp 12/03/23 0716 20     Temp 12/03/23 0716 98.3 F (36.8 C)     Temp Source 12/03/23 0716 Oral     SpO2 12/03/23 0716 100 %     Weight 12/03/23 0715 215 lb (97.5 kg)     Height 12/03/23 0715 6\' 1"  (1.854 m)     Head Circumference --      Peak Flow --      Pain Score 12/03/23 0714 7     Pain Loc --      Pain Education --      Exclude from Growth Chart --     Most recent vital signs: Vitals:   12/03/23 0716  BP: 115/89  Pulse: 99  Resp: 20  Temp: 98.3 F (36.8 C)  SpO2: 100%    Constitutional: Alert and oriented. Eyes: Conjunctivae are normal. Head: Atraumatic. Neck: Supple with no meningismus. Nose: No congestion/rhinnorhea. Mouth/Throat: Mucous membranes are moist.  Cardiovascular: Normal rate, regular rhythm. Grossly normal heart sounds.  2+ radial pulses bilaterally. Respiratory:  Normal respiratory effort.  No retractions. Lungs CTAB. Gastrointestinal: Soft and nontender. No distention. Musculoskeletal: No lower extremity tenderness nor edema.  Neurologic:  Normal speech and language. No gross focal neurologic deficits are appreciated.    ED Results / Procedures / Treatments   Labs (all labs ordered are listed, but only abnormal results are displayed) Labs Reviewed  CBC WITH DIFFERENTIAL/PLATELET - Abnormal; Notable for the following components:      Result Value   WBC 13.7 (*)    Neutro Abs 12.7 (*)    Lymphs Abs 0.3 (*)    Abs Immature Granulocytes 0.09 (*)    All other components within normal limits  COMPREHENSIVE METABOLIC PANEL - Abnormal; Notable for the following components:   Sodium 134 (*)    Glucose, Bld 145 (*)    All other components within normal limits  RESP PANEL BY RT-PCR (RSV, FLU A&B, COVID)  RVPGX2  TROPONIN I (HIGH SENSITIVITY)     EKG  ED ECG REPORT I, Chesley Noon, the attending physician, personally viewed and interpreted this ECG.   Date: 12/03/2023  EKG Time: 8:12  Rate: 94  Rhythm: normal sinus rhythm  Axis: Normal  Intervals:none  ST&T Change: None  RADIOLOGY Chest x-ray reviewed and interpreted by me with no infiltrate, edema, or effusion.  PROCEDURES:  Critical Care performed: No  Procedures   MEDICATIONS ORDERED IN ED: Medications  ondansetron (ZOFRAN) injection 4 mg (4 mg Intravenous Given 12/03/23 0751)  ketorolac (TORADOL) 30 MG/ML injection 15 mg (15 mg Intravenous Given 12/03/23 0750)  lactated ringers bolus 1,000 mL (1,000 mLs Intravenous New Bag/Given 12/03/23 0752)     IMPRESSION / MDM / ASSESSMENT AND PLAN / ED COURSE  I reviewed the triage vital signs and the nursing notes.                              43 y.o. male with past medical history of atrial fibrillation who presents to the ED with 24 to 48 hours of diffuse bodyaches including some chest pain with cough, nausea, malaise, and  headache.  Patient's presentation is most consistent with acute presentation with potential threat to life or bodily function.  Differential diagnosis includes, but is not limited to, COVID-19, influenza, other viral illness, pneumonia, anemia, electrolyte abnormality, AKI, ACS, PE, pneumothorax.  Patient nontoxic-appearing and in no acute distress, vital signs are unremarkable.  He complains of diffuse bodyaches but does specifically report dull aching pain in his chest for the past 24 hours.  Symptoms seem atypical for ACS and most likely related to viral illness, viral panel and chest x-ray are pending at this time.  We will check EKG and troponin in addition to other labs, hydrate with IV fluids and treat symptomatically with IV Zofran and Toradol.  No findings concerning for sepsis or meningitis.  Labs are remarkable for mild leukocytosis but no significant anemia, electrolyte abnormality, or AKI.  LFTs are unremarkable and troponin within normal limits.  Chest x-ray does show atelectasis versus infiltrate, will treat with antibiotics given leukocytosis and his symptoms with negative viral panel.  Plan to treat with doxycycline given his penicillin allergy, patient appropriate for outpatient management and counseled to follow-up with his PCP, otherwise return to the ED for new or worsening symptoms.  Patient agrees with plan.      FINAL CLINICAL IMPRESSION(S) / ED DIAGNOSES   Final diagnoses:  Pneumonia of right lower lobe due to infectious organism     Rx / DC Orders   ED Discharge Orders          Ordered    doxycycline (VIBRAMYCIN) 100 MG capsule  2 times daily        12/03/23 1105    ondansetron (ZOFRAN-ODT) 4 MG disintegrating tablet  Every 8 hours PRN        12/03/23 1105             Note:  This document was prepared using Dragon voice recognition software and may include unintentional dictation errors.   Chesley Noon, MD 12/03/23 1106

## 2023-12-03 NOTE — ED Notes (Signed)
 See triage notes. Patient c/o all over body aches and headache that started yesterday.

## 2023-12-03 NOTE — ED Triage Notes (Addendum)
 Pt to Ed via POV from home. Pt ambulatory to triage. Pt reports body aches, chills, cough and nausea x2-3days. Pt denies sick contacts. Pt denies SOB or CP.

## 2024-04-22 ENCOUNTER — Emergency Department
Admission: EM | Admit: 2024-04-22 | Discharge: 2024-04-22 | Disposition: A | Discharge status: Home / Self Care | Attending: Emergency Medicine | Admitting: Emergency Medicine

## 2024-04-22 ENCOUNTER — Emergency Department

## 2024-04-22 ENCOUNTER — Other Ambulatory Visit: Payer: Self-pay

## 2024-04-22 DIAGNOSIS — Z7901 Long term (current) use of anticoagulants: Secondary | ICD-10-CM | POA: Insufficient documentation

## 2024-04-22 DIAGNOSIS — R519 Headache, unspecified: Secondary | ICD-10-CM | POA: Insufficient documentation

## 2024-04-22 LAB — RESP PANEL BY RT-PCR (RSV, FLU A&B, COVID)  RVPGX2
Influenza A by PCR: NEGATIVE
Influenza B by PCR: NEGATIVE
Resp Syncytial Virus by PCR: NEGATIVE
SARS Coronavirus 2 by RT PCR: NEGATIVE

## 2024-04-22 MED ORDER — DIPHENHYDRAMINE HCL 50 MG/ML IJ SOLN
25.0000 mg | Freq: Once | INTRAMUSCULAR | Status: AC
  Administered 2024-04-22: 25 mg via INTRAVENOUS
  Filled 2024-04-22: qty 1

## 2024-04-22 MED ORDER — PROCHLORPERAZINE EDISYLATE 10 MG/2ML IJ SOLN
10.0000 mg | Freq: Once | INTRAMUSCULAR | Status: AC
  Administered 2024-04-22: 10 mg via INTRAVENOUS
  Filled 2024-04-22: qty 2

## 2024-04-22 MED ORDER — DEXAMETHASONE SODIUM PHOSPHATE 10 MG/ML IJ SOLN
10.0000 mg | Freq: Once | INTRAMUSCULAR | Status: AC
  Administered 2024-04-22: 10 mg via INTRAVENOUS
  Filled 2024-04-22: qty 1

## 2024-04-22 MED ORDER — SODIUM CHLORIDE 0.9 % IV BOLUS
1000.0000 mL | Freq: Once | INTRAVENOUS | Status: AC
  Administered 2024-04-22: 1000 mL via INTRAVENOUS

## 2024-04-22 MED ORDER — KETOROLAC TROMETHAMINE 15 MG/ML IJ SOLN
15.0000 mg | Freq: Once | INTRAMUSCULAR | Status: AC
  Administered 2024-04-22: 15 mg via INTRAVENOUS
  Filled 2024-04-22: qty 1

## 2024-04-22 NOTE — Discharge Instructions (Signed)
 You were evaluated in the ED for a headache and/or head injury. Your evaluation did not reveal signs of a serious condition such as a brain bleed or skull fracture and were reassuring.   Consider these rest and recovery strategies at home:  - Avoid strenous activity , exercise or heavy lifting for a few days.  -Limit screen time and activities requiring intense focus.  - Gradually return to normal activities as symptoms improve   Take tylenol  as needed.   Apply ice/cold pack to the affected area 10-20 minutes every 2-3 hours for the first 24-48 hours to reduce pain and swelling. Avoid alcohol and caffeine.   Follow up with you PCP in 1-2 weeks if symptoms persist. Return to ED if symptoms worsen.

## 2024-04-22 NOTE — ED Provider Notes (Signed)
 [facility] Emergency Department Provider Note     Event Date/Time   First MD Initiated Contact with Patient 04/22/24 1006     (approximate)   History   Migraine   HPI  Alex Chait. is a 43 y.o. male with a past medical history of atrial fibrillation per EMR presents to the ED for evaluation of a worsening headache that started last night approximately around 8 PM.  Patient describes this headache as a migraine that is constant pounding behind both eyes with associated light sensitivity. He states it has been intermittent for 1 month now, but severe in intensity last night.  Denies sick contacts or fever.  Patient has not tried any medication to relieve symptoms.  Denies head injury, trauma, or vision changes.  Does endorse headache is making him  feel sick, however denies nausea or vomiting.    Physical Exam   Triage Vital Signs: ED Triage Vitals [04/22/24 1001]  Encounter Vitals Group     BP (!) 138/100     Girls Systolic BP Percentile      Girls Diastolic BP Percentile      Boys Systolic BP Percentile      Boys Diastolic BP Percentile      Pulse Rate (!) 109     Resp 18     Temp 97.9 F (36.6 C)     Temp Source Oral     SpO2 99 %     Weight 213 lb 13.5 oz (97 kg)     Height 6' 1 (1.854 m)     Head Circumference      Peak Flow      Pain Score 9     Pain Loc      Pain Education      Exclude from Growth Chart     Most recent vital signs: Vitals:   04/22/24 1005 04/22/24 1010  BP:  117/86  Pulse:  79  Resp:  18  Temp:    SpO2: 100% 100%   General: Lying in a dark room. Alert and oriented.  Speaking in complete sentences. INAD.     Head:  NCAT.  Eyes:  PERRLA. EOMI.  Neck:   No cervical spine tenderness to palpation. Full ROM without difficulty.  CV:  Good peripheral perfusion. RRR.  RESP:  Normal effort. LCTAB.  NEURO: Cranial nerves II-XII intact. No focal deficits. Sensation and motor function intact. Normal muscle  strength of UE & LE. Gait is steady.     ED Results / Procedures / Treatments   Labs (all labs ordered are listed, but only abnormal results are displayed) Labs Reviewed  RESP PANEL BY RT-PCR (RSV, FLU A&B, COVID)  [id number]   RADIOLOGY  I personally viewed and evaluated these images as part of my medical decision making, as well as reviewing the written report by the radiologist.  ED Provider Interpretation: Normal head CT  CT Head Wo Contrast Result Date: 04/22/2024 EXAM: CT HEAD WITHOUT 04/22/2024 12:08:14 PM TECHNIQUE: CT of the head was performed without the administration of intravenous contrast. Automated exposure control, iterative reconstruction, and/or weight based adjustment of the mA/kV was utilized to reduce the radiation dose to as low as reasonably achievable. COMPARISON: None available. CLINICAL HISTORY: Headache, increasing frequency or severity. Pt states migraine since yesterday, pt did not take any OTC meds, pt denies head injury. Pt denies sick contacts. Pt states migraines when younger. FINDINGS: BRAIN AND VENTRICLES: No acute intracranial hemorrhage. No mass  effect or midline shift. No extra-axial fluid collection. Gray-white differentiation is maintained. No hydrocephalus. ORBITS: No acute abnormality. SINUSES AND MASTOIDS: There is mild mucosal thickening in the partially visualized right maxillary sinus. The mastoid air cells are clear. SOFT TISSUES AND SKULL: Soft tissue swelling in the right occipital scalp occipital region near midline. No acute skull fracture. IMPRESSION: 1. No acute intracranial abnormality. 2. Focal soft tissue swelling in the right occipital scalp/suboccipital region near midline. Recommend correlation with physical exam findings. Electronically signed by: Donnice Mania MD 04/22/2024 12:16 PM EDT RP Workstation: [id number]   PROCEDURES:  Critical Care performed: No  Procedures  MEDICATIONS ORDERED IN ED: Medications  prochlorperazine  (COMPAZINE )  injection 10 mg (10 mg Intravenous Given 04/22/24 1023)  sodium chloride  0.9 % bolus 1,000 mL (0 mLs Intravenous Stopped 04/22/24 1129)  dexamethasone  (DECADRON ) injection 10 mg (10 mg Intravenous Given 04/22/24 1024)  diphenhydrAMINE  (BENADRYL ) injection 25 mg (25 mg Intravenous Given 04/22/24 1024)  ketorolac  (TORADOL ) 15 MG/ML injection 15 mg (15 mg Intravenous Given 04/22/24 1024)     IMPRESSION / MDM / ASSESSMENT AND PLAN / ED COURSE  I reviewed the triage vital signs and the nursing notes.                             Clinical Course as of 04/22/24 1234  Wed Apr 22, 2024  1130 No improvement with migraine cocktail - will obtain CT head  [MH]  1231 Patient stating he is fine. Will discharge home with PCP f/u  [MH]    Clinical Course User Index [MH] Margrette Rebbeca LABOR, PA-C   43 y.o. male presents to the emergency department for evaluation and treatment of migraine. See HPI for further details.   Differential diagnosis includes, but is not limited to migraine, tension headache, viral URI, intracranial mass  Patient's presentation is most consistent with acute complicated illness / injury requiring diagnostic workup.  Patient is alert and oriented.  Hemodynamically stable.  Initially pulse rate 109 bpm, however this improved with repeat vital signs.  ED management with migraine cocktail and IV fluids.  Unchanged symptoms following migraine cocktail will obtain CT head given accelerating pattern, otherwise no other red flag signs.  Respiratory panel is negative.  CT head is reassuring.  Incidental finding of focal soft tissue swelling in the right occipital scalp discussed with patient.  He denies falling or any trauma or injury to this area.  No redness or warmth noted on physical exam and to this area.  On reassessment patient reports feeling fine.  He is in stable condition for discharge home.  Encouraged close follow-up with primary care provider in which she verbalized understanding.  ED  return precautions discussed.   FINAL CLINICAL IMPRESSION(S) / ED DIAGNOSES   Final diagnoses:  Bad headache   Rx / DC Orders   ED Discharge Orders     None       Note:  This document was prepared using Dragon voice recognition software and may include unintentional dictation errors.    Margrette, Vitaliy Eisenhour A, PA-C 04/22/24 1840    Waymond Lorelle Cummins, MD 04/23/24 [phone]

## 2024-04-22 NOTE — ED Triage Notes (Signed)
 Pt states migraine since yesterday, pt did not take any OTC meds, pt denies head injury. Pt denies sick contacts. Pt states migraines when younger.
